# Patient Record
Sex: Male | Born: 1945 | Race: Black or African American | Hispanic: No | Marital: Married | Smoking: Current every day smoker
Health system: Southern US, Community
[De-identification: ages and names within clinical notes are randomized; demographics above are authoritative.]

## PROBLEM LIST (undated history)

## (undated) DIAGNOSIS — I10 Essential (primary) hypertension: Secondary | ICD-10-CM

## (undated) DIAGNOSIS — F341 Dysthymic disorder: Secondary | ICD-10-CM

## (undated) DIAGNOSIS — H903 Sensorineural hearing loss, bilateral: Secondary | ICD-10-CM

## (undated) DIAGNOSIS — R7611 Nonspecific reaction to tuberculin skin test without active tuberculosis: Secondary | ICD-10-CM

## (undated) DIAGNOSIS — K2970 Gastritis, unspecified, without bleeding: Secondary | ICD-10-CM

## (undated) DIAGNOSIS — G4730 Sleep apnea, unspecified: Secondary | ICD-10-CM

## (undated) DIAGNOSIS — E663 Overweight: Secondary | ICD-10-CM

## (undated) DIAGNOSIS — E7800 Pure hypercholesterolemia, unspecified: Secondary | ICD-10-CM

## (undated) DIAGNOSIS — Z86010 Personal history of colonic polyps: Secondary | ICD-10-CM

## (undated) DIAGNOSIS — K2990 Gastroduodenitis, unspecified, without bleeding: Secondary | ICD-10-CM

## (undated) DIAGNOSIS — J449 Chronic obstructive pulmonary disease, unspecified: Secondary | ICD-10-CM

## (undated) DIAGNOSIS — F17200 Nicotine dependence, unspecified, uncomplicated: Secondary | ICD-10-CM

## (undated) DIAGNOSIS — K810 Acute cholecystitis: Secondary | ICD-10-CM

## (undated) DIAGNOSIS — D563 Thalassemia minor: Secondary | ICD-10-CM

## (undated) DIAGNOSIS — K5790 Diverticulosis of intestine, part unspecified, without perforation or abscess without bleeding: Secondary | ICD-10-CM

## (undated) HISTORY — DX: Pure hypercholesterolemia, unspecified: E78.00

## (undated) HISTORY — DX: Nonspecific reaction to tuberculin skin test without active tuberculosis: R76.11

## (undated) HISTORY — DX: Overweight: E66.3

## (undated) HISTORY — DX: Sleep apnea, unspecified: G47.30

## (undated) HISTORY — DX: Acute cholecystitis: K81.0

## (undated) HISTORY — DX: Thalassemia minor: D56.3

## (undated) HISTORY — DX: Personal history of colonic polyps: Z86.010

## (undated) HISTORY — DX: Chronic obstructive pulmonary disease, unspecified: J44.9

## (undated) HISTORY — DX: Dysthymic disorder: F34.1

## (undated) HISTORY — DX: Nicotine dependence, unspecified, uncomplicated: F17.200

## (undated) HISTORY — DX: Gastritis, unspecified, without bleeding: K29.70

## (undated) HISTORY — DX: Diverticulosis of intestine, part unspecified, without perforation or abscess without bleeding: K57.90

## (undated) HISTORY — PX: CATARACT EXTRACTION, BILATERAL: SHX1313

## (undated) HISTORY — DX: Essential (primary) hypertension: I10

## (undated) HISTORY — DX: Sensorineural hearing loss, bilateral: H90.3

## (undated) HISTORY — DX: Gastroduodenitis, unspecified, without bleeding: K29.90

---

## 1988-12-11 DIAGNOSIS — E785 Hyperlipidemia, unspecified: Secondary | ICD-10-CM | POA: Insufficient documentation

## 1998-03-11 ENCOUNTER — Encounter (INDEPENDENT_AMBULATORY_CARE_PROVIDER_SITE_OTHER): Payer: Self-pay | Admitting: Internal Medicine

## 1998-08-05 ENCOUNTER — Ambulatory Visit (HOSPITAL_COMMUNITY): Admission: RE | Admit: 1998-08-05 | Discharge: 1998-08-05 | Payer: Self-pay | Admitting: Gastroenterology

## 1999-05-12 ENCOUNTER — Encounter (INDEPENDENT_AMBULATORY_CARE_PROVIDER_SITE_OTHER): Payer: Self-pay | Admitting: Internal Medicine

## 2001-08-11 ENCOUNTER — Encounter (INDEPENDENT_AMBULATORY_CARE_PROVIDER_SITE_OTHER): Payer: Self-pay | Admitting: Internal Medicine

## 2001-08-11 LAB — CONVERTED CEMR LAB: PSA: 0.72 ng/mL

## 2002-01-10 ENCOUNTER — Ambulatory Visit (HOSPITAL_COMMUNITY): Admission: RE | Admit: 2002-01-10 | Discharge: 2002-01-10 | Payer: Self-pay | Admitting: Gastroenterology

## 2002-05-30 ENCOUNTER — Other Ambulatory Visit: Admission: RE | Admit: 2002-05-30 | Discharge: 2002-05-30 | Payer: Self-pay | Admitting: Internal Medicine

## 2003-06-11 ENCOUNTER — Encounter (INDEPENDENT_AMBULATORY_CARE_PROVIDER_SITE_OTHER): Payer: Self-pay | Admitting: Internal Medicine

## 2003-06-11 LAB — CONVERTED CEMR LAB: PSA: 0.5 ng/mL

## 2004-07-11 ENCOUNTER — Encounter (INDEPENDENT_AMBULATORY_CARE_PROVIDER_SITE_OTHER): Payer: Self-pay | Admitting: Internal Medicine

## 2004-07-11 LAB — CONVERTED CEMR LAB: PSA: 0.5 ng/mL

## 2005-03-17 ENCOUNTER — Ambulatory Visit: Payer: Self-pay | Admitting: Family Medicine

## 2005-03-23 ENCOUNTER — Ambulatory Visit: Payer: Self-pay | Admitting: Family Medicine

## 2005-08-22 ENCOUNTER — Ambulatory Visit: Payer: Self-pay | Admitting: Family Medicine

## 2006-03-23 ENCOUNTER — Ambulatory Visit: Payer: Self-pay | Admitting: Family Medicine

## 2006-06-08 ENCOUNTER — Ambulatory Visit: Payer: Self-pay | Admitting: Family Medicine

## 2006-07-11 ENCOUNTER — Emergency Department (HOSPITAL_COMMUNITY): Admission: EM | Admit: 2006-07-11 | Discharge: 2006-07-11 | Payer: Self-pay | Admitting: Emergency Medicine

## 2006-07-11 IMAGING — CR DG SHOULDER 2+V*L*
3 series · 3 of 3 positions shown · non-contrast
Comparison: None.

CLINICAL DATA: Chest pain.  
 CHEST - 2 VIEW:

[view not recorded (1 of 3)]
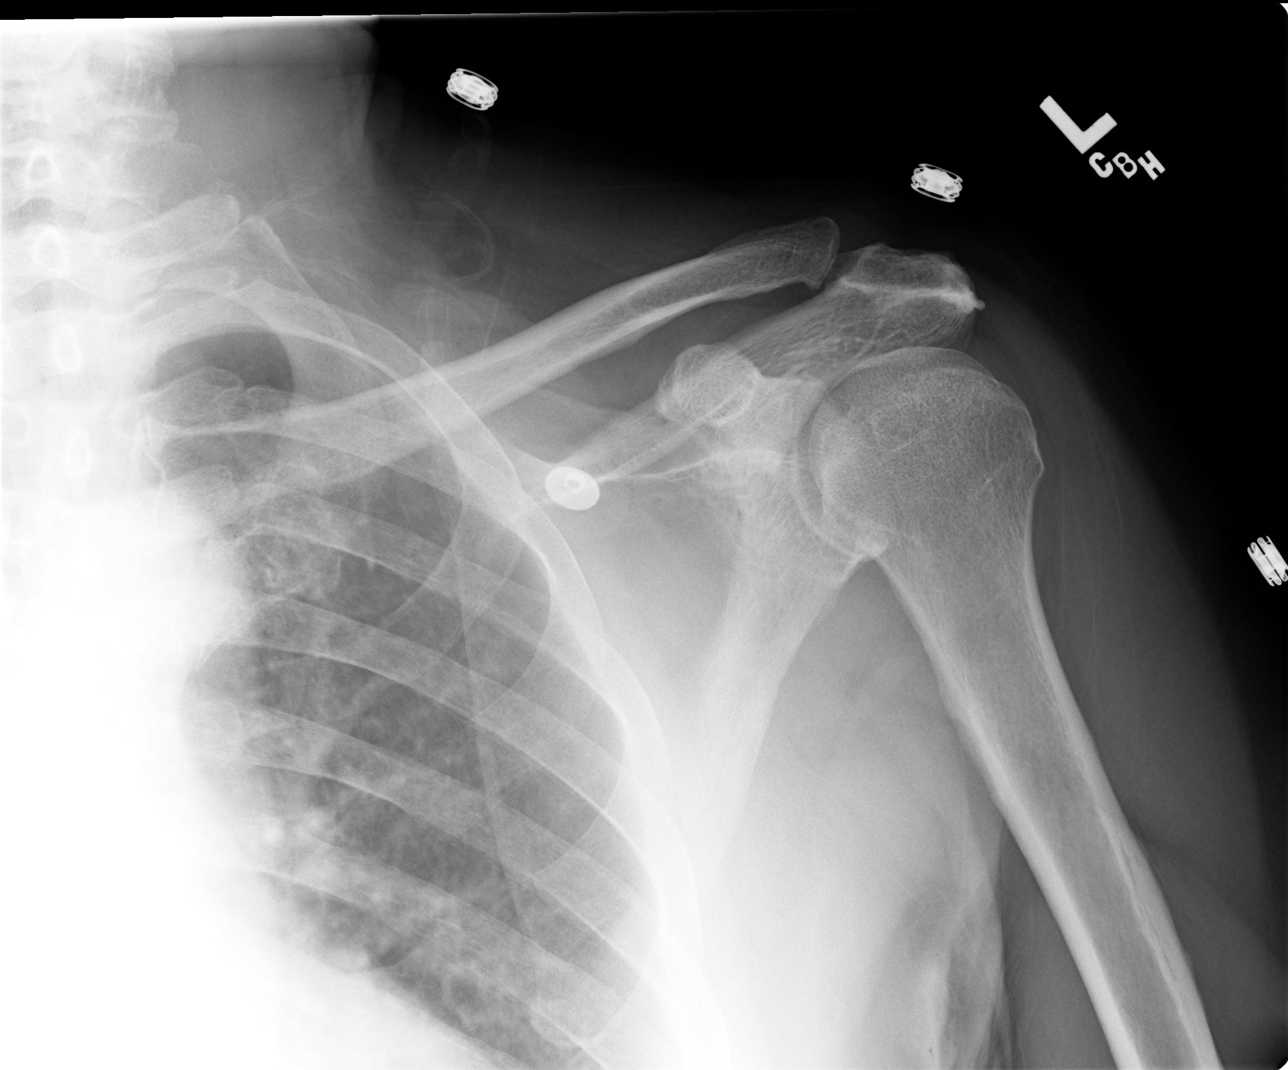

[view not recorded (2 of 3)]
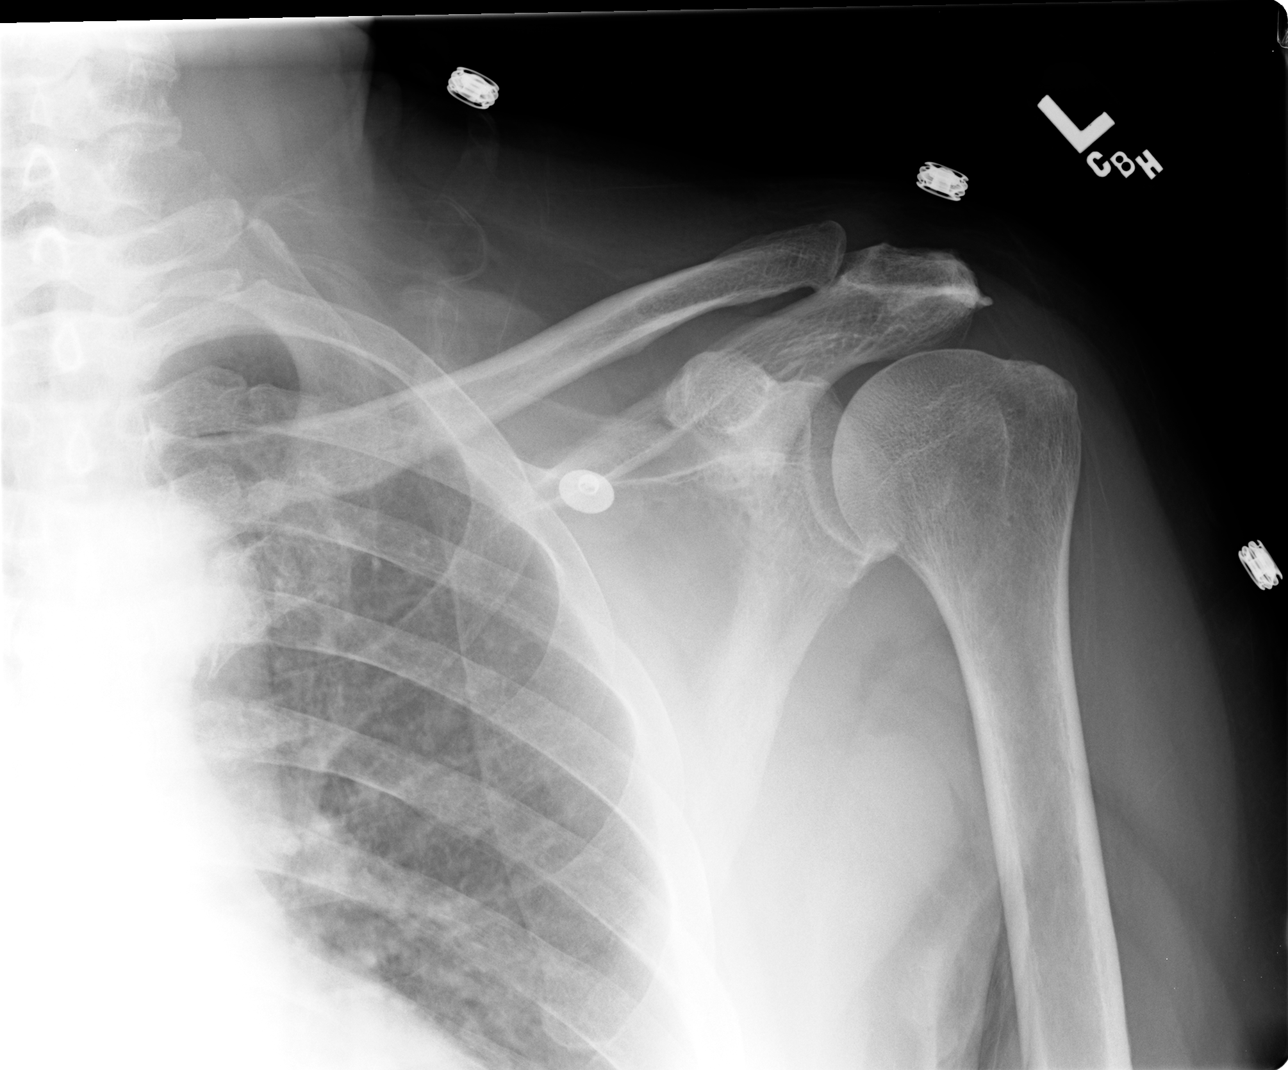

[view not recorded (3 of 3)]
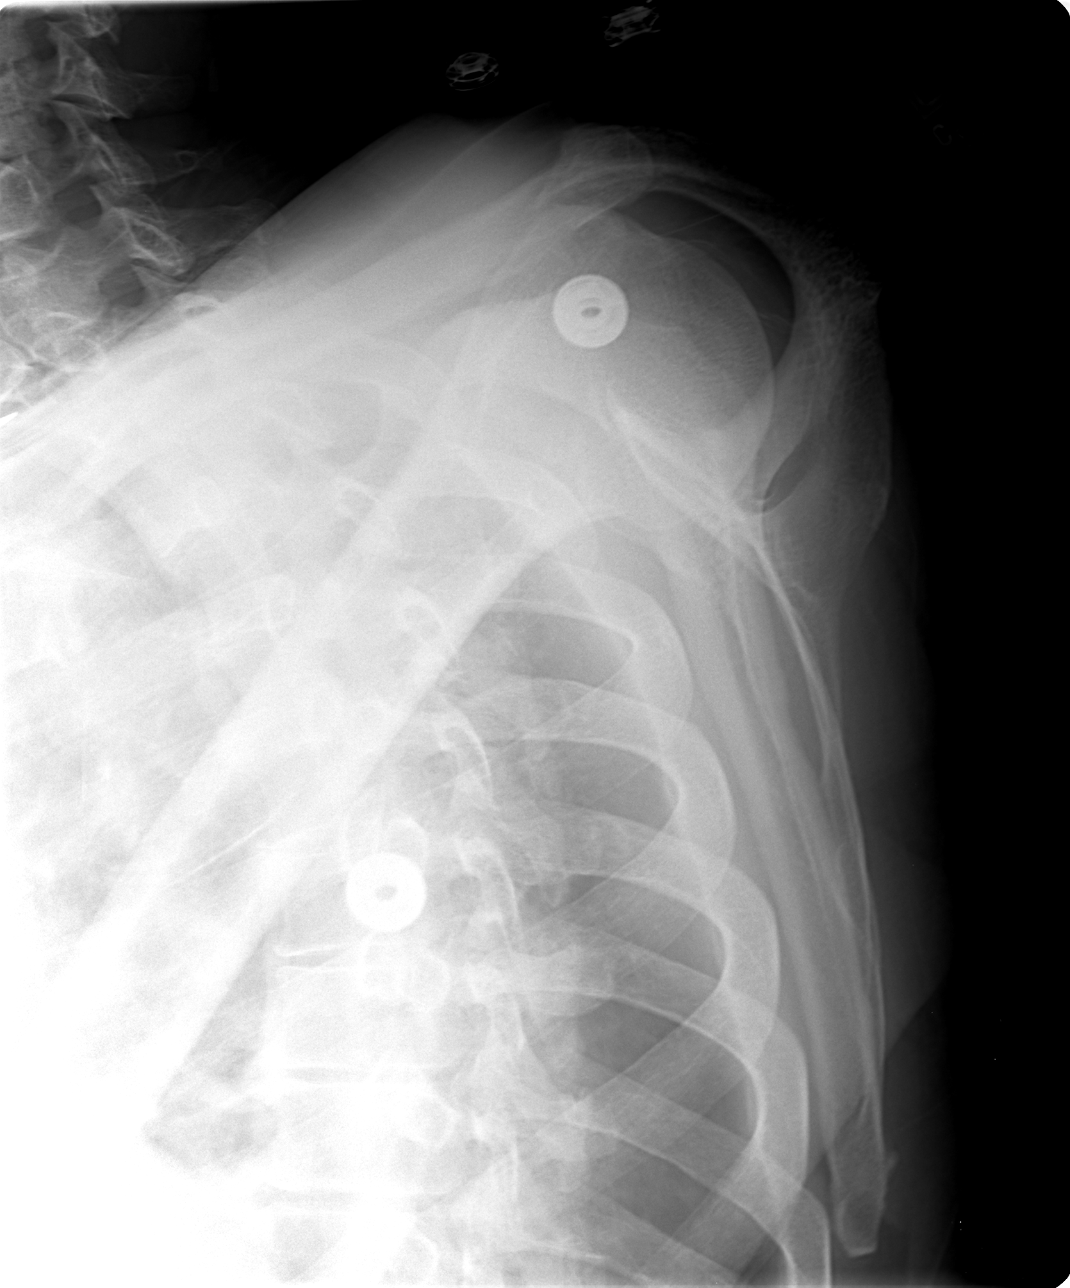

[3 of 3 positions shown; findings below may reference images not displayed]

FINDINGS: There is significant elevation of the right hemidiaphragm probably due to phrenic nerve paralysis.  I do not have prior films to determine if this is acute or chronic.  There is some compressive atelectasis in the right lower lobe.  The left lung is clear.  There is no effusion or heart failure.
IMPRESSION: There is significant elevation of the right hemidiaphragm with some right lower lobe atelectasis otherwise, no acute abnormality.
 LEFT SHOULDER - 3 VIEW:
 Normal alignment and no fracture. There is some down turning of the acromion with a lateral spur which could contribute to impingement. There is mild degenerative change and spurring in the AC joint.
IMPRESSION: Degenerative spurring of the acromion and AC joint.  No acute bony abnormality.

## 2006-07-11 IMAGING — CR DG CHEST 2V
2 series · 2 of 2 positions shown · non-contrast
Comparison: None.

CLINICAL DATA: Chest pain.  
 CHEST - 2 VIEW:

[view not recorded (1 of 2)]
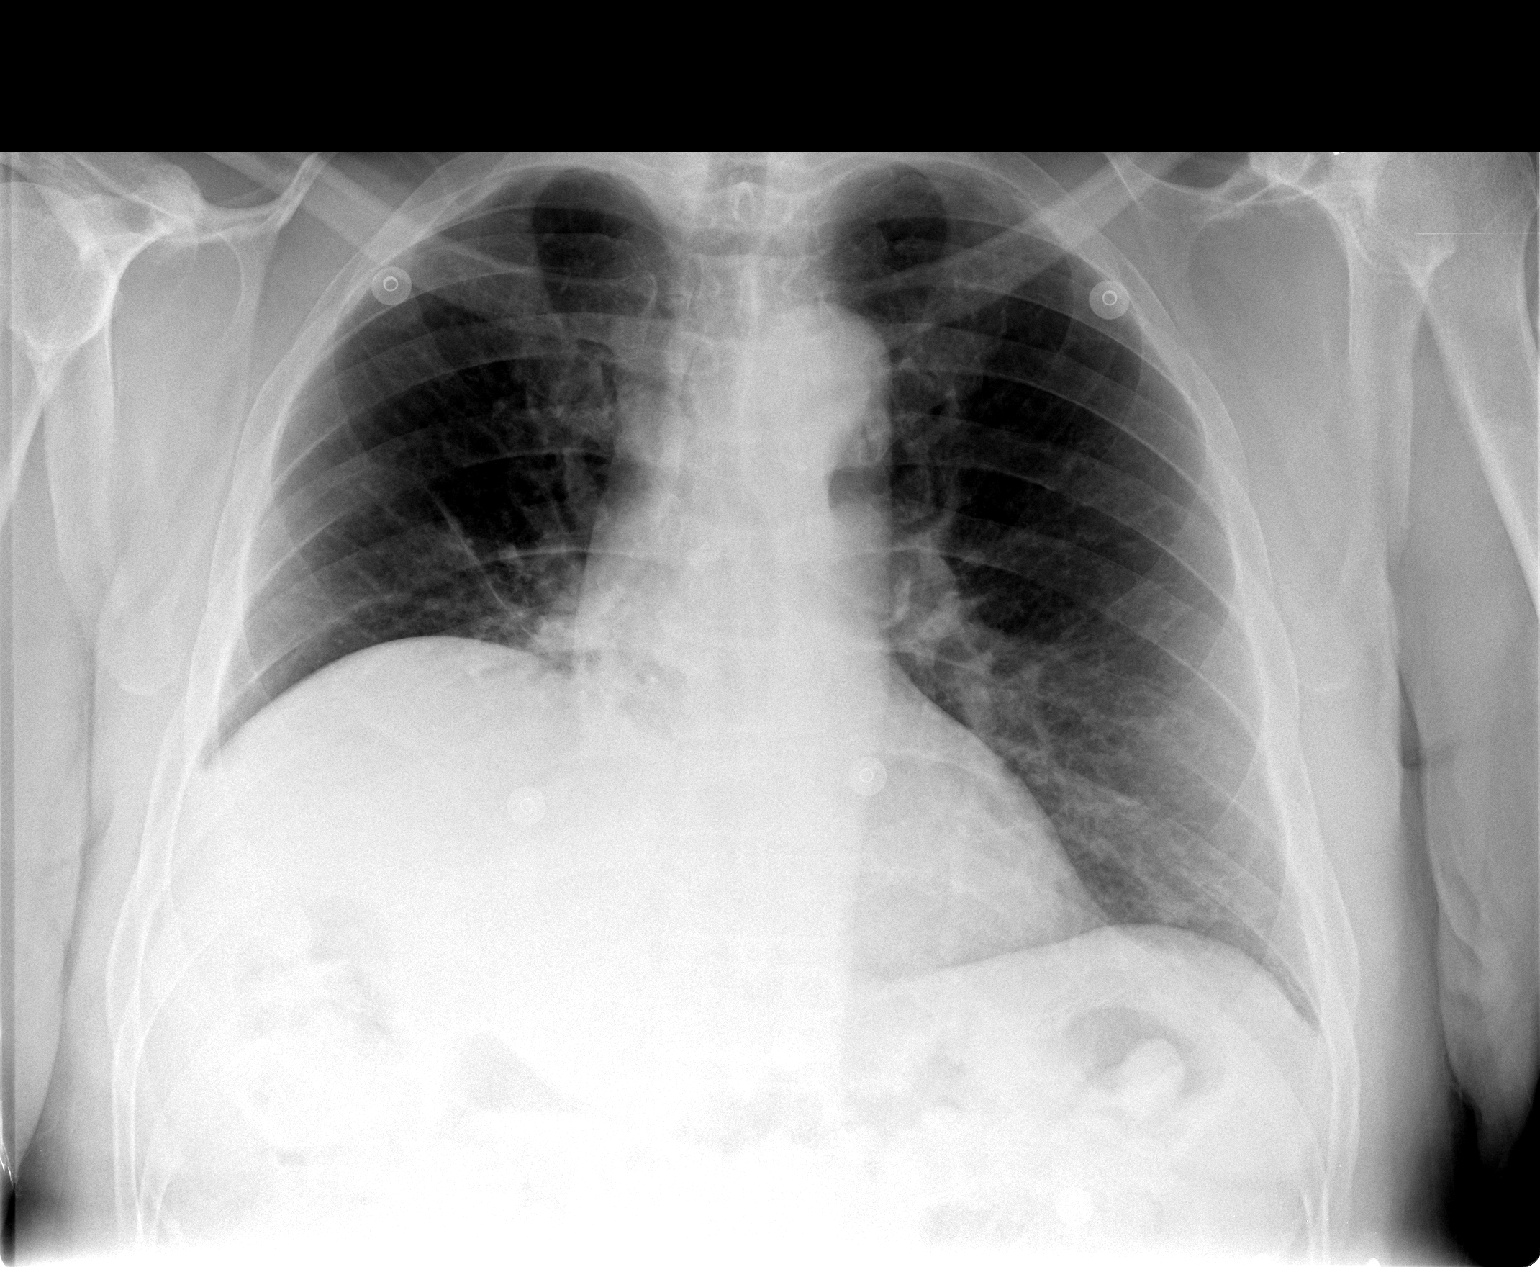

[view not recorded (2 of 2)]
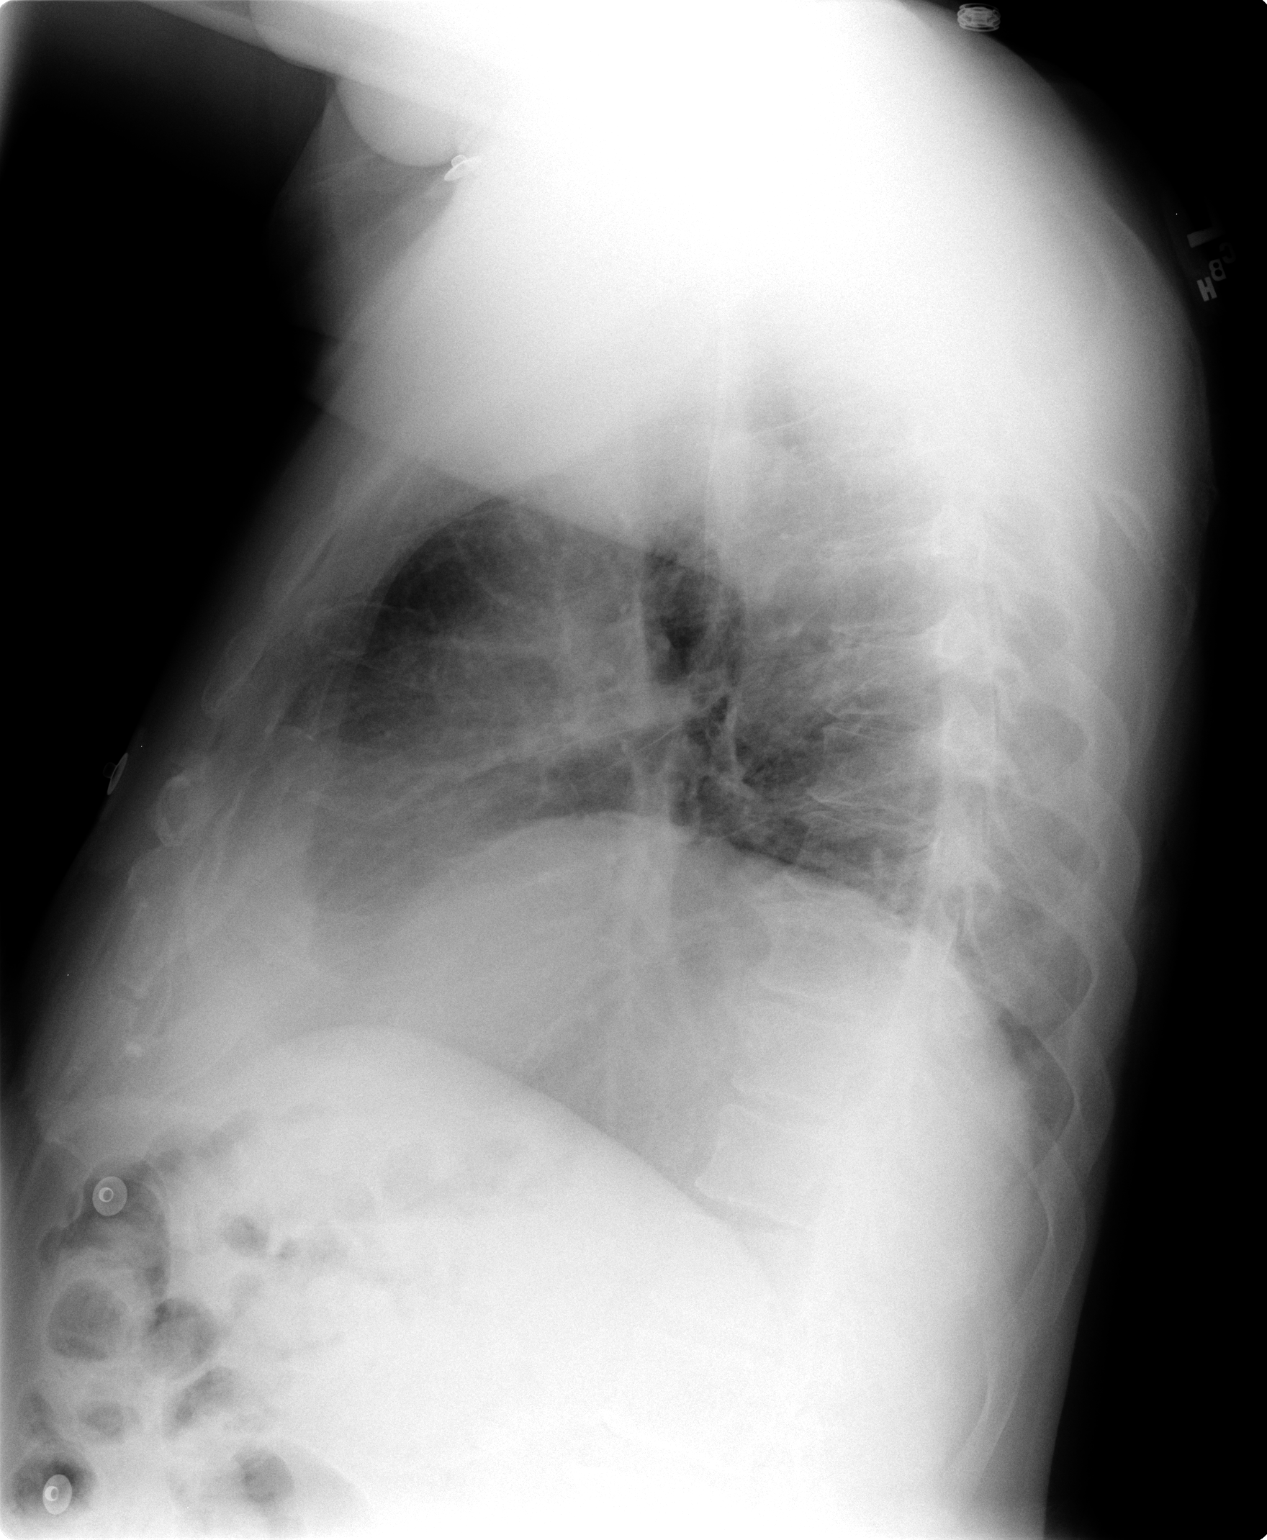

[2 of 2 positions shown; findings below may reference images not displayed]

FINDINGS: There is significant elevation of the right hemidiaphragm probably due to phrenic nerve paralysis.  I do not have prior films to determine if this is acute or chronic.  There is some compressive atelectasis in the right lower lobe.  The left lung is clear.  There is no effusion or heart failure.
IMPRESSION: There is significant elevation of the right hemidiaphragm with some right lower lobe atelectasis otherwise, no acute abnormality.
 LEFT SHOULDER - 3 VIEW:
 Normal alignment and no fracture. There is some down turning of the acromion with a lateral spur which could contribute to impingement. There is mild degenerative change and spurring in the AC joint.
IMPRESSION: Degenerative spurring of the acromion and AC joint.  No acute bony abnormality.

## 2006-10-26 ENCOUNTER — Encounter (INDEPENDENT_AMBULATORY_CARE_PROVIDER_SITE_OTHER): Payer: Self-pay | Admitting: Internal Medicine

## 2006-10-26 ENCOUNTER — Ambulatory Visit: Payer: Self-pay | Admitting: Family Medicine

## 2006-10-26 LAB — CONVERTED CEMR LAB: PSA: 1.07 ng/mL

## 2007-05-03 ENCOUNTER — Ambulatory Visit: Payer: Self-pay | Admitting: Family Medicine

## 2007-05-03 DIAGNOSIS — I10 Essential (primary) hypertension: Secondary | ICD-10-CM

## 2007-05-03 DIAGNOSIS — E7800 Pure hypercholesterolemia, unspecified: Secondary | ICD-10-CM

## 2007-05-03 DIAGNOSIS — F17200 Nicotine dependence, unspecified, uncomplicated: Secondary | ICD-10-CM

## 2007-05-03 DIAGNOSIS — E669 Obesity, unspecified: Secondary | ICD-10-CM

## 2007-05-07 LAB — CONVERTED CEMR LAB
AST: 16 units/L (ref 0–37)
BUN: 6 mg/dL (ref 6–23)
CO2: 31 meq/L (ref 19–32)
Calcium: 9.3 mg/dL (ref 8.4–10.5)
Chloride: 108 meq/L (ref 96–112)
Cholesterol: 151 mg/dL (ref 0–200)
Creatinine, Ser: 1.1 mg/dL (ref 0.4–1.5)
GFR calc Af Amer: 88 mL/min

## 2007-05-31 ENCOUNTER — Telehealth (INDEPENDENT_AMBULATORY_CARE_PROVIDER_SITE_OTHER): Payer: Self-pay | Admitting: *Deleted

## 2007-10-09 ENCOUNTER — Encounter (INDEPENDENT_AMBULATORY_CARE_PROVIDER_SITE_OTHER): Payer: Self-pay | Admitting: Internal Medicine

## 2007-10-09 DIAGNOSIS — K2970 Gastritis, unspecified, without bleeding: Secondary | ICD-10-CM | POA: Insufficient documentation

## 2007-10-09 DIAGNOSIS — Z87448 Personal history of other diseases of urinary system: Secondary | ICD-10-CM

## 2007-10-09 DIAGNOSIS — Z860100 Personal history of colon polyps, unspecified: Secondary | ICD-10-CM | POA: Insufficient documentation

## 2007-10-09 DIAGNOSIS — K625 Hemorrhage of anus and rectum: Secondary | ICD-10-CM

## 2007-10-09 DIAGNOSIS — K2990 Gastroduodenitis, unspecified, without bleeding: Secondary | ICD-10-CM

## 2007-11-22 ENCOUNTER — Ambulatory Visit: Payer: Self-pay | Admitting: Internal Medicine

## 2007-11-22 ENCOUNTER — Ambulatory Visit: Payer: Self-pay | Admitting: Family Medicine

## 2007-11-26 LAB — CONVERTED CEMR LAB
ALT: 17 units/L (ref 0–53)
Albumin: 3.7 g/dL (ref 3.5–5.2)
Bilirubin, Direct: 0.2 mg/dL (ref 0.0–0.3)
HDL: 39.4 mg/dL (ref >39.0)
LDL Cholesterol: 99 mg/dL (ref 0–99)
VLDL: 11 mg/dL (ref 0–40)

## 2007-12-27 ENCOUNTER — Ambulatory Visit: Payer: Self-pay | Admitting: Family Medicine

## 2008-06-26 ENCOUNTER — Ambulatory Visit: Payer: Self-pay | Admitting: Family Medicine

## 2008-06-26 DIAGNOSIS — G4733 Obstructive sleep apnea (adult) (pediatric): Secondary | ICD-10-CM

## 2008-06-30 LAB — CONVERTED CEMR LAB
ALT: 14 units/L (ref 0–53)
BUN: 9 mg/dL (ref 6–23)
Calcium: 9.4 mg/dL (ref 8.4–10.5)
GFR calc Af Amer: 79 mL/min
Glucose, Bld: 99 mg/dL (ref 70–99)
HDL: 38.7 mg/dL — ABNORMAL LOW (ref >39.0)
LDL Cholesterol: 90 mg/dL (ref 0–99)
Sodium: 138 meq/L (ref 135–145)
VLDL: 11 mg/dL (ref 0–40)

## 2008-07-10 ENCOUNTER — Ambulatory Visit: Payer: Self-pay | Admitting: Pulmonary Disease

## 2008-08-25 ENCOUNTER — Encounter: Payer: Self-pay | Admitting: Pulmonary Disease

## 2008-08-25 ENCOUNTER — Ambulatory Visit (HOSPITAL_BASED_OUTPATIENT_CLINIC_OR_DEPARTMENT_OTHER): Admission: RE | Admit: 2008-08-25 | Discharge: 2008-08-25 | Payer: Self-pay | Admitting: Pulmonary Disease

## 2008-09-09 ENCOUNTER — Ambulatory Visit: Payer: Self-pay | Admitting: Pulmonary Disease

## 2008-09-18 ENCOUNTER — Ambulatory Visit: Payer: Self-pay | Admitting: Pulmonary Disease

## 2008-09-25 ENCOUNTER — Telehealth: Payer: Self-pay | Admitting: Internal Medicine

## 2008-09-29 ENCOUNTER — Telehealth (INDEPENDENT_AMBULATORY_CARE_PROVIDER_SITE_OTHER): Payer: Self-pay | Admitting: *Deleted

## 2008-10-15 ENCOUNTER — Encounter: Payer: Self-pay | Admitting: Pulmonary Disease

## 2008-10-23 ENCOUNTER — Ambulatory Visit: Payer: Self-pay | Admitting: Family Medicine

## 2008-10-23 DIAGNOSIS — F341 Dysthymic disorder: Secondary | ICD-10-CM

## 2008-10-23 HISTORY — DX: Dysthymic disorder: F34.1

## 2008-11-27 ENCOUNTER — Ambulatory Visit: Payer: Self-pay | Admitting: Family Medicine

## 2009-03-25 ENCOUNTER — Ambulatory Visit: Payer: Self-pay | Admitting: Family Medicine

## 2009-03-25 DIAGNOSIS — F528 Other sexual dysfunction not due to a substance or known physiological condition: Secondary | ICD-10-CM

## 2009-03-25 LAB — CONVERTED CEMR LAB
ALT: 14 units/L (ref 0–53)
AST: 13 units/L (ref 0–37)
Calcium: 9.2 mg/dL (ref 8.4–10.5)
Creatinine, Ser: 1.1 mg/dL (ref 0.4–1.5)
LDL Cholesterol: 101 mg/dL — ABNORMAL HIGH (ref 0–99)
PSA: 0.9 ng/mL (ref 0.10–4.00)
TSH: 0.43 microintl units/mL (ref 0.35–5.50)
VLDL: 11.6 mg/dL (ref 0.0–40.0)

## 2009-04-09 ENCOUNTER — Encounter (INDEPENDENT_AMBULATORY_CARE_PROVIDER_SITE_OTHER): Payer: Self-pay | Admitting: Internal Medicine

## 2009-06-11 ENCOUNTER — Ambulatory Visit: Payer: Self-pay | Admitting: Family Medicine

## 2009-07-09 ENCOUNTER — Encounter (INDEPENDENT_AMBULATORY_CARE_PROVIDER_SITE_OTHER): Payer: Self-pay | Admitting: Internal Medicine

## 2010-01-06 ENCOUNTER — Ambulatory Visit: Payer: Self-pay | Admitting: Family Medicine

## 2010-01-07 ENCOUNTER — Telehealth: Payer: Self-pay | Admitting: Family Medicine

## 2010-01-07 LAB — CONVERTED CEMR LAB
ALT: 24 units/L (ref 0–53)
AST: 18 units/L (ref 0–37)
Albumin: 4.1 g/dL (ref 3.5–5.2)
Alkaline Phosphatase: 95 units/L (ref 39–117)
BUN: 12 mg/dL (ref 6–23)
Bilirubin, Direct: 0.1 mg/dL (ref 0.0–0.3)
CO2: 28 meq/L (ref 19–32)
Calcium: 9.7 mg/dL (ref 8.4–10.5)
Chloride: 108 meq/L (ref 96–112)
Cholesterol: 157 mg/dL (ref 0–200)
Creatinine, Ser: 1.2 mg/dL (ref 0.4–1.5)
GFR calc non Af Amer: 78.86 mL/min (ref >60)
Glucose, Bld: 89 mg/dL (ref 70–99)
HDL: 46.8 mg/dL (ref >39.00)
LDL Cholesterol: 95 mg/dL (ref 0–99)
PSA: 0.93 ng/mL (ref 0.10–4.00)
Potassium: 4.2 meq/L (ref 3.5–5.1)
Sodium: 142 meq/L (ref 135–145)
Total Bilirubin: 1 mg/dL (ref 0.3–1.2)
Total CHOL/HDL Ratio: 3
Total Protein: 6.4 g/dL (ref 6.0–8.3)
Triglycerides: 77 mg/dL (ref 0.0–149.0)
VLDL: 15.4 mg/dL (ref 0.0–40.0)

## 2010-05-05 ENCOUNTER — Telehealth (INDEPENDENT_AMBULATORY_CARE_PROVIDER_SITE_OTHER): Payer: Self-pay | Admitting: *Deleted

## 2010-08-23 ENCOUNTER — Telehealth: Payer: Self-pay | Admitting: Family Medicine

## 2010-12-07 ENCOUNTER — Ambulatory Visit: Payer: Self-pay | Admitting: Family Medicine

## 2010-12-07 LAB — CONVERTED CEMR LAB
Albumin: 3.9 g/dL (ref 3.5–5.2)
Alkaline Phosphatase: 99 units/L (ref 39–117)
Calcium: 9.5 mg/dL (ref 8.4–10.5)
Creatinine, Ser: 1.1 mg/dL (ref 0.4–1.5)
GFR calc non Af Amer: 88.79 mL/min (ref >60.00)
Glucose, Bld: 87 mg/dL (ref 70–99)
HDL: 42.7 mg/dL (ref >39.00)
PSA: 0.88 ng/mL (ref 0.10–4.00)
Sodium: 142 meq/L (ref 135–145)
TSH: 0.55 microintl units/mL (ref 0.35–5.50)
VLDL: 10.4 mg/dL (ref 0.0–40.0)

## 2011-01-10 NOTE — Progress Notes (Signed)
Summary: has not made appt for colonoscopy  Phone Note Other Incoming   Caller: Patient Call For: Ruthe Mannan MD Caller: Nurse from Dr. Donavan Burnet office Summary of Call: Dr. Donavan Burnet office is calling to let you know that Xaden came to his first initial visit w/ Dr. Matthias Hughs, but still has not scheduled appt. for his colonoscopy. They just wanted to inform you. There number is [phone] Initial call taken by: Melody Comas,  August 23, 2010 1:54 PM  Follow-up for Phone Call        thank you. Ruthe Mannan MD  August 24, 2010 8:53 AM

## 2011-01-10 NOTE — Assessment & Plan Note (Signed)
Summary: 30 MIN ROA 6 MTHS CYD   Vital Signs:  Patient profile:   65 year old male Height:      69 inches Weight:      241 pounds BMI:     35.72 Temp:     98 degrees F oral Pulse rate:   84 / minute Pulse rhythm:   regular BP sitting:   128 / 70  (left arm) Cuff size:   large  Vitals Entered By: Delilah Shan CMA [facility] Dull) (January 06, 2010 9:06 AM) CC: 30 minute OV - 6 months follow up, Lipid Management, Hypertension Management   History of Present Illness: 65 yo male here to establish care with me, was a patient of Billie Bean.  Doing well.  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  Trying to get used to be at home, big adjustment but he is happy to be retired.  Wants to get out of house more but too cold right now.  HLD- On Crestor 5 mg daily.  In 03/2009, lipids were near goal with LDL 101, HDL 40 and TG 58.   Goal LDL for him less than 100 given risk factors of HTN, smoking, family history, and age.   Well man- has h/o colon polyps, last colonscopy I can find in old notes was 2003.  Supposed to repeat every 5 years.  He cannot remember name of GI doc.  He will call me when he gets home.  Has not had any changes in his bowels, blood in stool, or abdominal pain.  30 minutes spent reviewing old chart, current office notes and discussing with patient.   Hypertension History:      He denies headache, chest pain, palpitations, dyspnea with exertion, orthopnea, PND, peripheral edema, visual symptoms, neurologic problems, syncope, and side effects from treatment.  He notes no problems with any antihypertensive medication side effects.        Positive major cardiovascular risk factors include male age 35 years old or older, hyperlipidemia, hypertension, family history for ischemic heart disease (females less than 77 years old), and current tobacco user.    Lipid Management History:      Positive NCEP/ATP III risk factors include male age 58 years old or older, family history for  ischemic heart disease (females less than 67 years old), current tobacco user, and hypertension.      Current Medications (verified): 1)  Crestor 5 Mg Tabs (Rosuvastatin Calcium) .... Take 1 Tablet By Mouth Once A Day 2)  Metamucil 30.9 % Powd (Psyllium) .... As Needed 3)  Daily Multiple Vitamins  Tabs (Multiple Vitamin) .... Take 1 Tablet By Mouth Once A Day 4)  Adult Aspirin Ec Low Strength 81 Mg  Tbec (Aspirin) .... Take One Tablet Daily. 5)  Norvasc 10 Mg Tabs (Amlodipine Besylate) .Marland Kitchen.. 1 Once Daily For Bp By Mouth 6)  Levitra 20 Mg Tabs (Vardenafil Hcl) .... As Needed  Allergies: 1)  ! Sulfa 2)  ! Zoloft 3)  Aspirin  Past History:  Past Medical History: Last updated: 09/18/2008  Current Problems:  WELL ADULT (ICD-V70.0) SLEEP APNEA (ICD-780.57), PSG 08/25/08 RDI 16 Hx of RECTAL BLEEDING (ICD-569.3) Hx of GASTRITIS (ICD-535.50) PROSTATITIS, HX OF (ICD-V13.09) COLONIC POLYPS, HX OF (ICD-V12.72) TOBACCO ABUSE (ICD-305.1) OVERWEIGHT (ICD-278.02) HYPERCHOLESTEROLEMIA, PURE (ICD-272.0) HYPERTENSION, BENIGN ESSENTIAL (ICD-401.1) PNEUMONIA  Past Surgical History: Last updated: 10/09/2007 Colonoscopy- polyps (07/1998),////  neg (12/2001)  Family History: Last updated: 12-16-2007 Father: died n his 60's--cirrhoisis of liver,  Mother: HTN Siblings: 2 sister --1  died at age 65 of MI, DM,HBP                               1--mini stroke at 81, HBP  DM-M cousin MI-MGM CVA- mgm--died of Prostate Cancer-0 Breast Cancer-0 Ovarian Cancer-0 Uterine Cancer-0 Colon Cancer-0 Drug/ ETOH Abuse-MUNCLE Depression-   Social History: Last updated: 05/03/2007 Marital Status: Married Children: 2, 3 grand children Occupation: drives truck --long distance  Risk Factors: Alcohol Use: 0 (03/25/2009) Caffeine Use: 0 (03/25/2009) Exercise: no (03/25/2009)  Risk Factors: Smoking Status: current (03/25/2009) Packs/Day: 1.0 (03/25/2009) Cigars/wk: 0 (03/25/2009) Pipe Use/wk: 0  (03/25/2009) Cans of tobacco/wk: 0 (03/25/2009) Passive Smoke Exposure: yes (11/22/2007)  Review of Systems      See HPI General:  Denies malaise and weakness. Eyes:  Denies blurring. ENT:  Denies difficulty swallowing. CV:  Denies chest pain or discomfort, difficulty breathing at night, difficulty breathing while lying down, fainting, leg cramps with exertion, lightheadness, near fainting, palpitations, shortness of breath with exertion, swelling of feet, and swelling of hands. Resp:  Denies shortness of breath. GI:  Denies abdominal pain, bloody stools, and change in bowel habits. GU:  Denies dysuria and erectile dysfunction. Psych:  Denies anxiety and depression.  Physical Exam  General:  alert, well-developed, well-nourished, well-hydrated, and overweight-appearing.   Lungs:  normal respiratory effort, no intercostal retractions, no accessory muscle use, and normal breath sounds.   Heart:  normal rate, regular rhythm, and no murmur.   Extremities:  no edema eithr lower legs Psych:  normally interactive and good eye contact.     Impression & Recommendations:  Problem # 1:  HYPERCHOLESTEROLEMIA, PURE (ICD-272.0) Assessment Unchanged Near goal.  Recheck FLP, LFTs today. His updated medication list for this problem includes:    Crestor 5 Mg Tabs (Rosuvastatin calcium) .Marland Kitchen... Take 1 tablet by mouth once a day  Orders: Venipuncture (19147) TLB-Lipid Panel (80061-LIPID) TLB-Hepatic/Liver Function Pnl (80076-HEPATIC)  Problem # 2:  HYPERTENSION, BENIGN ESSENTIAL (ICD-401.1) Assessment: Unchanged Stable.   Continue Norvasc, not having any issues with side effects. His updated medication list for this problem includes:    Norvasc 10 Mg Tabs (Amlodipine besylate) .Marland Kitchen... 1 once daily for bp by mouth  Orders: Venipuncture (82956) TLB-BMP (Basic Metabolic Panel-BMET) (80048-METABOL)  Problem # 3:  COLONIC POLYPS, HX OF (ICD-V12.72) Assessment: Unchanged Needs a colonoscopy.  He  wants to see old GI doctor but cannot remember name.  He will call us when he gets home.  Problem # 4:  SPECIAL SCREENING MALIGNANT NEOPLASM OF PROSTATE (ICD-V76.44) PSA today. Orders: Venipuncture (21308) TLB-PSA (Prostate Specific Antigen) (84153-PSA)  Complete Medication List: 1)  Crestor 5 Mg Tabs (Rosuvastatin calcium) .... Take 1 tablet by mouth once a day 2)  Metamucil 30.9 % Powd (Psyllium) .... As needed 3)  Daily Multiple Vitamins Tabs (Multiple vitamin) .... Take 1 tablet by mouth once a day 4)  Adult Aspirin Ec Low Strength 81 Mg Tbec (Aspirin) .... Take one tablet daily. 5)  Norvasc 10 Mg Tabs (Amlodipine besylate) .Marland Kitchen.. 1 once daily for bp by mouth 6)  Levitra 20 Mg Tabs (Vardenafil hcl) .... As needed  Hypertension Assessment/Plan:      The patient's hypertensive risk group is category B: At least one risk factor (excluding diabetes) with no target organ damage.  His calculated 10 year risk of coronary heart disease is 22 %.  Today's blood pressure is 128/70.  His blood pressure goal is < 140/90.  Lipid Assessment/Plan:      Based on NCEP/ATP III, the patient's risk factor category is "2 or more risk factors and a calculated 10 year CAD risk of > 20%".  The patient's lipid goals are as follows: Total cholesterol goal is 200; LDL cholesterol goal is 100; HDL cholesterol goal is 40; Triglyceride goal is 150.  His LDL cholesterol goal has not been met.  He has been counseled on adjunctive measures for lowering his cholesterol and has been provided with dietary instructions.    Patient Instructions: 1)  It was really nice to meet you, Mr. Loewe. 2)  Give me a call when you get home and find your GI doctor's name and when your last colonoscopy was. 3)  We will be in touch with your labs over the next couple of days, either by phonetree or we will call you. Prescriptions: NORVASC 10 MG TABS (AMLODIPINE BESYLATE) 1 once daily for BP by mouth  #30 Tablet x 6   Entered and  Authorized by:   Ruthe Mannan MD   Signed by:   Ruthe Mannan MD on 01/06/2010   Method used:   [facility] to        [facility] (retail)       [address]       [place], [place]  [address]       Ph: [phone] or [phone]       Fax: [phone]   RxID:   [id number] CRESTOR 5 MG TABS (ROSUVASTATIN CALCIUM) Take 1 tablet by mouth once a day  #30 Tablet x 6   Entered and Authorized by:   Ruthe Mannan MD   Signed by:   Ruthe Mannan MD on 01/06/2010   Method used:   [facility]        [facility] (retail)       [address]       [place], [place]  [address]       Ph: [phone] or [phone]       Fax: [phone]   RxID:   [id number]   Current Allergies (reviewed today): ! SULFA ! ZOLOFT ASPIRIN

## 2011-01-10 NOTE — Progress Notes (Signed)
  Phone Note Other Incoming   Request: Send information Summary of Call: Request for records received from [facility]. Request forwarded to [facility].     

## 2011-01-10 NOTE — Progress Notes (Signed)
Summary: Update on pt's GI dr.  Phone Note Call from Patient Call back at [phone]   Caller: Patient Call For: Dr. Dayton Martes Summary of Call: Pt saw Dr Dayton Martes on 01/06/10. Dr. Tasia Catchings is pt's GI doctor and pt was not sure when last colonoscopy. I called Dr. Sherin Quarry' office and pt had last colonoscopy in Jan 2003 which was normal. Pt was to repeat colonscopy in 5 years and did not.Dr Sherin Quarry has retired. thank you. Initial call taken by: Lewanda Rife LPN,  January 07, 2010 1:27 PM  Follow-up for Phone Call        Appt made with Dr Matthias Hughs on 03/01/2010 at 2:00pm. Follow-up by: Carlton Adam,  January 11, 2010 8:50 AM

## 2011-01-12 NOTE — Assessment & Plan Note (Signed)
Summary: CPX/TRANSFER FROM [facility]   Vital Signs:  Patient profile:   65 year old male Height:      69 inches Weight:      246 pounds BMI:     36.46 Temp:     97.8 degrees F oral Pulse rate:   80 / minute Pulse rhythm:   regular BP sitting:   138 / 80  (left arm) Cuff size:   large  Vitals Entered By: Selena Batten Dance CMA Duncan Dull) (December 07, 2010 8:23 AM) CC: CPx/Transfer from [facility]   History of Present Illness: CC: CPE  1. HLD - takes crestor 5mg  tolerates well.  No myalgias.  Too expensive, requests change.    2. HTN - on norvasc 10mg  daily, tolerates well.  no HA, vision changes, chest pain, tightness, urinary changes, LE swelling.    3. smoking - 1/2 ppd.  needs to stop.    preventative -  flu shot - doesn't take pneumovax - takes, not due yet prostate - always normal, last checked last year, no recent DRE colonoscopy - due, last 2003, h/o polyps.  Dr. Matthias Hughs.    Lipid Management History:      Positive NCEP/ATP III risk factors include male age 76 years old or older, family history for ischemic heart disease (females less than 3 years old), current tobacco user, and hypertension.     Current Medications (verified): 1)  Crestor 5 Mg Tabs (Rosuvastatin Calcium) .... Take 1 Tablet By Mouth Once A Day 2)  Metamucil 30.9 % Powd (Psyllium) .... As Needed 3)  Daily Multiple Vitamins  Tabs (Multiple Vitamin) .... Take 1 Tablet By Mouth Once A Day 4)  Adult Aspirin Ec Low Strength 81 Mg  Tbec (Aspirin) .... Take One Tablet Daily. 5)  Norvasc 10 Mg Tabs (Amlodipine Besylate) .Marland Kitchen.. 1 Once Daily For Bp By Mouth  Allergies: 1)  ! Sulfa 2)  ! Zoloft 3)  Aspirin  Past History:  Past Surgical History: Last updated: 10/09/2007 Colonoscopy- polyps (07/1998),////  neg (12/2001)  Past Medical History: HYPERCHOLESTEROLEMIA, PURE (ICD-272.0) HYPERTENSION, BENIGN ESSENTIAL (ICD-401.1) SLEEP APNEA (ICD-780.57), PSG 08/25/08 RDI 16 Hx of RECTAL BLEEDING (ICD-569.3) Hx of  GASTRITIS (ICD-535.50) PROSTATITIS, HX OF (ICD-V13.09) COLONIC POLYPS, HX OF (ICD-V12.72) TOBACCO ABUSE (ICD-305.1) OVERWEIGHT (ICD-278.02) PNEUMONIA  Family History: Father: died n his 60's--cirrhoisis of liver,  Mother: HTN Siblings: 2 sister --1 died at age 47 of MI, DM,HBP                               1--mini stroke at 49, HBP  DM-M cousin MI-MGM CVA- mgm--died of Prostate Cancer-0 Colon Cancer-0 Drug/ ETOH Abuse-MUNCLE  Social History: Smoking 1/2 ppd ( ~40PY), no EtOH, no rec drugs caffeine: occasional tea Marital Status: Married Children: 2, 3 grand children Occupation: drives truck - retired 2010  Review of Systems  The patient denies anorexia, fever, weight loss, weight gain, vision loss, decreased hearing, hoarseness, chest pain, syncope, dyspnea on exertion, peripheral edema, prolonged cough, headaches, hemoptysis, abdominal pain, melena, hematochezia, severe indigestion/heartburn, hematuria, incontinence, difficulty walking, and depression.    Physical Exam  General:  alert, well-developed, well-nourished, well-hydrated, and overweight-appearing.   Head:  Normocephalic and atraumatic without obvious abnormalities. No apparent alopecia or balding. Eyes:  No corneal or conjunctival inflammation noted. EOMI. Perrla.  Ears:  Tms clear bilaterally Nose:  nares clear bilaterally Mouth:  MMM, no pharyngeal erythema/ edema Neck:  No deformities, masses, or tenderness noted.  no bruits appreciated Lungs:  Normal respiratory effort, chest expands symmetrically. Lungs are clear to auscultation, no crackles or wheezes. Heart:  Normal rate and regular rhythm. S1 and S2 normal without gallop, murmur, click, rub or other extra sounds. Abdomen:  Bowel sounds positive,abdomen soft and non-tender without masses, organomegaly or hernias noted. Rectal:  No external abnormalities noted. Normal sphincter tone. No rectal masses or tenderness. guaiac negative Prostate:  Prostate  gland firm and smooth, no enlargement, nodularity, tenderness, mass, asymmetry or induration. Msk:  No deformity or scoliosis noted of thoracic or lumbar spine.   Pulses:  2+ rad pulses Extremities:  no pedal edema Neurologic:  CN grossly intact, station and gait intact Skin:  Intact without suspicious lesions or rashes Psych:  full affect, pleasant and cooperative   Impression & Recommendations:  Problem # 1:  WELL ADULT (ICD-V70.0) Reviewed preventive care protocols, scheduled due services, and updated immunizations.  declines flu.  rec schedule colonsocopy, due.  last 2003 and normal but rec rpt 5 yeras as h/o polyps.  Problem # 2:  SPECIAL SCREENING MALIGNANT NEOPLASM OF PROSTATE (ICD-V76.44) DRE reassuring today, check PSA.  Orders: TLB-PSA (Prostate Specific Antigen) (84153-PSA)  Problem # 3:  HYPERCHOLESTEROLEMIA, PURE (ICD-272.0) discussed different options including lipitor vs pravastatin.  Pt hesitant about side effects of lipitor.  would like to try pravastatin.  start at 40mg  which should be equivalent to 5mg  crestor.  His updated medication list for this problem includes:    Pravastatin Sodium 40 Mg Tabs (Pravastatin sodium) .Marland Kitchen... Take one daily for cholesterol  Orders: TLB-Lipid Panel (80061-LIPID) TLB-BMP (Basic Metabolic Panel-BMET) (80048-METABOL) TLB-Hepatic/Liver Function Pnl (80076-HEPATIC)  Problem # 4:  HYPERTENSION, BENIGN ESSENTIAL (ICD-401.1) good control with current regimen.  His updated medication list for this problem includes:    Norvasc 10 Mg Tabs (Amlodipine besylate) .Marland Kitchen... 1 once daily for bp by mouth  BP today: 138/80 Prior BP: 128/70 (01/06/2010)  10 Yr Risk Heart Disease: 11 % Prior 10 Yr Risk Heart Disease: 22 % (01/06/2010)  Labs Reviewed: K+: 4.2 (01/06/2010) Creat: : 1.2 (01/06/2010)   Chol: 157 (01/06/2010)   HDL: 46.80 (01/06/2010)   LDL: 95 (01/06/2010)   TG: 77.0 (01/06/2010)  Problem # 5:  SPECIAL SCREENING FOR MALIGNANT  NEOPLASMS COLON (ICD-V76.51) rec set up for colonoscopy.  Orders: Hemoccult Guaiac-1 spec.(in office) (82270)  Problem # 6:  TOBACCO ABUSE (ICD-305.1) encouraged cessation.  Complete Medication List: 1)  Pravastatin Sodium 40 Mg Tabs (Pravastatin sodium) .... Take one daily for cholesterol 2)  Metamucil 30.9 % Powd (Psyllium) .... As needed 3)  Daily Multiple Vitamins Tabs (Multiple vitamin) .... Take 1 tablet by mouth once a day 4)  Adult Aspirin Ec Low Strength 81 Mg Tbec (Aspirin) .... Take one tablet daily. 5)  Norvasc 10 Mg Tabs (Amlodipine besylate) .Marland Kitchen.. 1 once daily for bp by mouth  Lipid Assessment/Plan:      Based on NCEP/ATP III, the patient's risk factor category is "0-1 risk factors".  The patient's lipid goals are as follows: Total cholesterol goal is 200; LDL cholesterol goal is 100; HDL cholesterol goal is 40; Triglyceride goal is 150.  His LDL cholesterol goal has not been met.  He has been counseled on adjunctive measures for lowering his cholesterol and has been provided with dietary instructions.    Patient Instructions: 1)  Return in 1 year for next CPE or as needed. 2)  Call Dr. Matthias Hughs to schedule colonoscopy. 3)  Prostate exam today. 4)  Change Crestor to  Pravastatin daily.  Return in 4-5 months for cholesterol check lab visit (FLP, LFTs 272.4).  schedule appointment up front. 5)  Try to quit smoking on your own.  If you want any help, please return to see [place] for suggesions.  [email or web address] Prescriptions: PRAVASTATIN SODIUM 40 MG TABS (PRAVASTATIN SODIUM) take one daily for cholesterol  #30 x 6   Entered and Authorized by:   Eustaquio Boyden  MD   Signed by:   Eustaquio Boyden  MD on 12/07/2010   Method used:   [facility]        [facility] [id number]       [address]       [place], [place]  [address]       Ph: [phone] or [phone]       Fax: [phone]   RxID:   [id number]    Orders Added: 1)  New Patient 40-64  years [99386] 2)  Hemoccult Guaiac-1 spec.(in office) [82270] 3)  TLB-Lipid Panel [80061-LIPID] 4)  TLB-BMP (Basic Metabolic Panel-BMET) [80048-METABOL] 5)  TLB-Hepatic/Liver Function Pnl [80076-HEPATIC] 6)  TLB-PSA (Prostate Specific Antigen) [66063-KZS]    Current Allergies (reviewed today): ! SULFA ! ZOLOFT ASPIRIN   Prevention & Chronic Care Immunizations   Influenza vaccine: Fluvax 3+  (12/06/1999)   Influenza vaccine deferral: Refused  (12/07/2010)   Influenza vaccine due: 12/05/2000    Tetanus booster: 06/26/2008: Td   Tetanus booster due: 06/26/2018    Pneumococcal vaccine: Pneumovax  (06/26/2008)   Pneumococcal vaccine due: 06/26/2013    H. zoster vaccine: Not documented  Colorectal Screening   Hemoccult: Not documented    Colonoscopy: Not documented  Other Screening   PSA: 0.93  (01/06/2010)   PSA ordered.   PSA due due: 03/25/2010   Smoking status: current  (03/25/2009)  Lipids   Total Cholesterol: 157  (01/06/2010)   LDL: 95  (01/06/2010)   LDL Direct: Not documented   HDL: 46.80  (01/06/2010)   Triglycerides: 77.0  (01/06/2010)    SGOT (AST): 18  (01/06/2010)   SGPT (ALT): 24  (01/06/2010)   Alkaline phosphatase: 95  (01/06/2010)   Total bilirubin: 1.0  (01/06/2010)    Lipid flowsheet reviewed?: Yes   Progress toward LDL goal: At goal  Hypertension   Last Blood Pressure: 138 / 80  (12/07/2010)   Serum creatinine: 1.2  (01/06/2010)   Serum potassium 4.2  (01/06/2010)    Hypertension flowsheet reviewed?: Yes   Progress toward BP goal: At goal  Self-Management Support :    Hypertension self-management support: Not documented    Lipid self-management support: Not documented

## 2011-03-22 ENCOUNTER — Ambulatory Visit: Payer: Self-pay | Admitting: Internal Medicine

## 2011-03-23 ENCOUNTER — Other Ambulatory Visit: Payer: Self-pay | Admitting: Family Medicine

## 2011-03-24 ENCOUNTER — Other Ambulatory Visit: Payer: Self-pay | Admitting: *Deleted

## 2011-03-24 MED ORDER — AMLODIPINE BESYLATE 10 MG PO TABS: ORAL_TABLET | ORAL | Status: DC

## 2011-04-25 NOTE — Procedures (Signed)
NAME:  DESTINY, TRICKEY NO.:  [id number]   MEDICAL RECORD NO.:  [id number]          PATIENT TYPE:  OUT   LOCATION:  SLEEP CENTER                 FACILITY:  [facility]   PHYSICIAN:  Coralyn Helling, MD        DATE OF BIRTH:  1946/01/28   DATE OF STUDY:  08/25/2008                            NOCTURNAL POLYSOMNOGRAM   REFERRING PHYSICIAN:   REFERRING PHYSICIAN:  Coralyn Helling, M.D.   INDICATION FOR STUDY:  Mr. Broyhill is a 65 year old male who has a  history of hypertension and sleep disruption of excessive daytime  sleepiness.  He is referred to the sleep lab for evaluation of  hypersomnia with obstructive sleep apnea.   Height is 5 feet 10 inches.  Weight is 230 pounds.  BMI is 33.  Neck  size is 16 inches.  Blood pressure is 132/78.   EPWORTH SLEEPINESS SCORE:  11.   MEDICATIONS:  1. Crestor.  2. Tiazac.   SLEEP ARCHITECTURE:  Total recording time was 353 minutes.  Total sleep  time was 386 minutes.  Sleep efficiency is 85%.  Sleep latency is 7.5  minutes, which is reduced.  REM latency is 262 minutes, which is  prolonged.  The study was notable for a lack of slow wave sleep and a  reduction in the percentage of REM sleep.  The patient slept both in the  supine and non-supine positions.   RESPIRATORY DATA:  The average respiratory rate was 18.  The overall  apnea-hypopnea index was 4.  The overall Respiratory Disturbance Index  was 16.  The events were exclusively obstructive in nature.  The REM  apnea-hypopnea index was 45.  The non-REM apnea-hypopnea index was 2.4.  The supine apnea-hypopnea index was 7.8.  The non-supine apnea-hypopnea  index was 1.  Moderate snoring was noted by the technician.   OXYGEN DATA:  The baseline oxygenation was 99%.  The oxygen saturation  nadir was 83%.  The patient's spent a total of 3.4 minutes with an  oxygen saturation less than 90% and 2 minutes with an oxygen saturation  less than 88%.   CARDIAC DATA:  The average heart  rate was 69, and the rhythm strip  showed normal sinus rhythm with PVCs.   MOVEMENT-PARASOMNIA:  The patient had one restroom trip.  The periodic  limb movement index was 0.   IMPRESSIONS-RECOMMENDATIONS:  1. This study shows evidence for mild sleep disordered breathing with      a respiratory index of 16 and oxygen saturation nadir of 83%.  He      did have a significant positional as well as REM effect to his      sleep disordered breathing.  2. The patient should be counseled regarding the points of diet,      exercise, and weight reduction.  3. The patient could try positional therapy in addition to weight      reduction.  If these interventions are unsuccessful, then further      consideration should be given to either CPAP therapy, an oral      appliance, or surgical intervention.      Coralyn Helling, MD  Diplomat, American  Board of Sleep Medicine  Electronically Signed     VS/MEDQ  D:  09/09/2008 17:22:52  T:  09/09/2008 19:04:22  Job:  [id number]

## 2011-05-03 ENCOUNTER — Other Ambulatory Visit: Payer: Self-pay | Admitting: Family Medicine

## 2011-05-03 DIAGNOSIS — E785 Hyperlipidemia, unspecified: Secondary | ICD-10-CM

## 2011-05-09 ENCOUNTER — Other Ambulatory Visit (INDEPENDENT_AMBULATORY_CARE_PROVIDER_SITE_OTHER): Payer: Self-pay | Admitting: Family Medicine

## 2011-05-09 DIAGNOSIS — E785 Hyperlipidemia, unspecified: Secondary | ICD-10-CM

## 2011-05-09 LAB — HEPATIC FUNCTION PANEL
ALT: 18 U/L (ref 0–53)
AST: 18 U/L (ref 0–37)
Albumin: 3.9 g/dL (ref 3.5–5.2)
Alkaline Phosphatase: 79 U/L (ref 39–117)
Total Protein: 6 g/dL (ref 6.0–8.3)

## 2011-05-09 LAB — LIPID PANEL
Cholesterol: 167 mg/dL (ref 0–200)
LDL Cholesterol: 106 mg/dL — ABNORMAL HIGH (ref 0–99)
Triglycerides: 86 mg/dL (ref 0.0–149.0)

## 2011-05-24 ENCOUNTER — Other Ambulatory Visit: Payer: Self-pay | Admitting: Family Medicine

## 2011-07-26 ENCOUNTER — Other Ambulatory Visit: Payer: Self-pay | Admitting: Family Medicine

## 2011-11-21 ENCOUNTER — Other Ambulatory Visit: Payer: Self-pay | Admitting: Family Medicine

## 2011-12-18 ENCOUNTER — Other Ambulatory Visit: Payer: Self-pay | Admitting: Family Medicine

## 2011-12-22 ENCOUNTER — Encounter: Payer: Self-pay | Admitting: Family Medicine

## 2011-12-22 ENCOUNTER — Ambulatory Visit (INDEPENDENT_AMBULATORY_CARE_PROVIDER_SITE_OTHER): Payer: Self-pay | Admitting: Family Medicine

## 2011-12-22 ENCOUNTER — Other Ambulatory Visit: Payer: Self-pay | Admitting: Family Medicine

## 2011-12-22 DIAGNOSIS — I10 Essential (primary) hypertension: Secondary | ICD-10-CM

## 2011-12-22 DIAGNOSIS — Z125 Encounter for screening for malignant neoplasm of prostate: Secondary | ICD-10-CM

## 2011-12-22 DIAGNOSIS — E7800 Pure hypercholesterolemia, unspecified: Secondary | ICD-10-CM

## 2011-12-22 DIAGNOSIS — E663 Overweight: Secondary | ICD-10-CM

## 2011-12-22 DIAGNOSIS — Z86010 Personal history of colonic polyps: Secondary | ICD-10-CM

## 2011-12-22 DIAGNOSIS — F17200 Nicotine dependence, unspecified, uncomplicated: Secondary | ICD-10-CM

## 2011-12-22 DIAGNOSIS — F341 Dysthymic disorder: Secondary | ICD-10-CM

## 2011-12-22 LAB — COMPREHENSIVE METABOLIC PANEL
ALT: 20 U/L (ref 0–53)
AST: 14 U/L (ref 0–37)
CO2: 29 mEq/L (ref 19–32)
Calcium: 9.5 mg/dL (ref 8.4–10.5)
Chloride: 104 mEq/L (ref 96–112)
GFR: 79.12 mL/min (ref >60.00)
Potassium: 4.3 mEq/L (ref 3.5–5.1)
Sodium: 140 mEq/L (ref 135–145)
Total Protein: 6.1 g/dL (ref 6.0–8.3)

## 2011-12-22 LAB — PSA: PSA: 0.88 ng/mL (ref 0.10–4.00)

## 2011-12-22 LAB — LIPID PANEL: Total CHOL/HDL Ratio: 4

## 2011-12-22 MED ORDER — AMLODIPINE BESYLATE 10 MG PO TABS: 10.0000 mg | ORAL_TABLET | Freq: Every day | ORAL | Status: DC

## 2011-12-22 MED ORDER — PRAVASTATIN SODIUM 40 MG PO TABS: 40.0000 mg | ORAL_TABLET | Freq: Every day | ORAL | Status: DC

## 2011-12-22 NOTE — Progress Notes (Addendum)
  Subjective:    Patient ID: Dominic Turner, male    DOB: [birth year], 66 y.o.   MRN: [id number]  HPI CC: med refill  Wife with dementia - having trouble calming down, stays agitated.  Sole caregiver.  Has to do everything for her.  Has 2 sons but live out of state.  Does not take time for himself.  Endorses some depression sxs, not overwhelming.  Able to calm down.  Thinks would be able to walk with wife.  HTN - No HA, vision changes, CP/tightness, SOB, leg swelling.  Compliant and tolerant on amlodipine  HLD - no myalgias. Lab Results  Component Value Date   CHOL 167 05/09/2011   HDL 43.60 05/09/2011   LDLCALC 106* 05/09/2011   TRIG 86.0 05/09/2011   CHOLHDL 4 05/09/2011   Smoking - 1/2 ppd.  (prior 1 ppd).  Working on cutting back.  Wt Readings from Last 3 Encounters:  12/22/11 247 lb 8 oz (112.265 kg)  12/07/10 246 lb (111.585 kg)  01/06/10 241 lb (109.317 kg)   preventative -  flu shot - doesn't take  pneumovax - takes, not due yet  prostate - always normal, last checked last year, no recent DRE  colonoscopy - due, last 2003, h/o polyps. Dr. Matthias Hughs.  Wants to get on medicare 09/2012, then will get colonoscopy.  Declines iFOB.  Review of Systems Per HPI    Objective:   Physical Exam  Nursing note and vitals reviewed. Constitutional: He appears well-developed and well-nourished. No distress.  HENT:  Head: Normocephalic and atraumatic.  Mouth/Throat: Oropharynx is clear and moist. No oropharyngeal exudate.  Eyes: Conjunctivae and EOM are normal. Pupils are equal, round, and reactive to light. No scleral icterus.  Neck: Normal range of motion. Neck supple.  Cardiovascular: Normal rate, regular rhythm, normal heart sounds and intact distal pulses.   No murmur heard. Pulmonary/Chest: Effort normal and breath sounds normal. No respiratory distress. He has no wheezes. He has no rales.  Genitourinary: Rectum normal and prostate normal. Rectal exam shows no external hemorrhoid,  no internal hemorrhoid, no fissure, no mass, no tenderness and anal tone normal. Guaiac negative stool.  Musculoskeletal: He exhibits no edema.  Lymphadenopathy:    He has no cervical adenopathy.  Skin: Skin is warm and dry. No rash noted.       Assessment & Plan:

## 2011-12-22 NOTE — Assessment & Plan Note (Signed)
Chronic, stable. Good control on current regimen, continue

## 2011-12-22 NOTE — Patient Instructions (Signed)
Good to see you today, I've refilled your medicines Return after October for follow up or as needed. Call us with questions otherwise.

## 2011-12-22 NOTE — Assessment & Plan Note (Addendum)
Planning on scheduling at end of year. Hemoccult neg today. PSA, DRE reassuring today.

## 2011-12-22 NOTE — Assessment & Plan Note (Signed)
Good control. Refilled meds

## 2011-12-22 NOTE — Assessment & Plan Note (Signed)
Discussed caregiver burnout, importance of taking time for himself. Discussed possibility of xanax or other similar med, pt will try to do self treatment with increased activity (could take wife with him to track and walk, etc). If wants to try med, will let me know.

## 2011-12-22 NOTE — Assessment & Plan Note (Signed)
Continue to encourage cessation. Pt knows to come in if desires further assistance with quitting.

## 2012-03-18 ENCOUNTER — Ambulatory Visit (INDEPENDENT_AMBULATORY_CARE_PROVIDER_SITE_OTHER): Payer: Self-pay | Admitting: Family Medicine

## 2012-03-18 ENCOUNTER — Encounter: Payer: Self-pay | Admitting: Family Medicine

## 2012-03-18 VITALS — BP 140/78 | HR 96 | Temp 98.9°F | Wt 247.8 lb

## 2012-03-18 DIAGNOSIS — J069 Acute upper respiratory infection, unspecified: Secondary | ICD-10-CM | POA: Insufficient documentation

## 2012-03-18 MED ORDER — GUAIFENESIN-CODEINE 100-10 MG/5ML PO SYRP: 5.0000 mL | ORAL_SOLUTION | Freq: Every evening | ORAL | Status: AC | PRN

## 2012-03-18 MED ORDER — FLUTICASONE PROPIONATE 50 MCG/ACT NA SUSP: 2.0000 | Freq: Every day | NASAL | Status: DC

## 2012-03-18 NOTE — Patient Instructions (Signed)
Sounds like you have a viral upper respiratory infection, possibly early viral sinusitis. Antibiotics are not needed for this.  Viral infections usually take 7-10 days to resolve.  The cough can last a few weeks to go away. Use medication as prescribed: cheratussin for cough at night. Push fluids and plenty of rest. Continue simple mucinex with plenty of water. Please return if you are not improving as expected, or if you have high fevers (>101.5) or difficulty swallowing or worsening productive cough. Call clinic with questions.  Good to see you today.

## 2012-03-18 NOTE — Assessment & Plan Note (Addendum)
Main concern is night time congestion. Anticipate viral URTI, could be early sinusitis but anticipate viral. Recommend supportive care.  Update [place] if not improving as expected or worsening.

## 2012-03-18 NOTE — Progress Notes (Signed)
  Subjective:    Patient ID: Dominic Turner, male    DOB: Oct 20, 1946, 66 y.o.   MRN: [id number]  HPI CC: congested  6d h/o cold/congestion in chest.  Cough productive of yellow sputum.  Also feels congested in head and headache.  +PNDrainage.  Feels some wheezy.  Trouble sleeping at night from congestion.  Chills noted.  Started with ST but that is better.  Tried OTC meds (corsidin, mucinex, alka seltzer cold, tylenol) but not really helping.  No abd pain, n/v, SOB, CP/tightness.  No ear or tooth pain.  No sick contacts at home.  Smoking 1/2 ppd but decreased since ill.  No h/o asthma.  States h/o chronic bronchitis.  Past Medical History  Diagnosis Date  . Pure hypercholesterolemia   . Essential hypertension, benign   . Unspecified sleep apnea   . Hemorrhage of rectum and anus   . Unspecified gastritis and gastroduodenitis without mention of hemorrhage   . Personal history of other disorder of urinary system   . Personal history of colonic polyps   . Tobacco use disorder   . Overweight     Review of Systems Per HPI    Objective:   Physical Exam  Nursing note and vitals reviewed. Constitutional: He appears well-developed and well-nourished. No distress.  HENT:  Head: Normocephalic and atraumatic.  Right Ear: Hearing, tympanic membrane, external ear and ear canal normal.  Left Ear: Hearing, tympanic membrane, external ear and ear canal normal.  Nose: Nose normal. No mucosal edema or rhinorrhea. Right sinus exhibits no maxillary sinus tenderness and no frontal sinus tenderness. Left sinus exhibits no maxillary sinus tenderness and no frontal sinus tenderness.  Mouth/Throat: Uvula is midline and mucous membranes are normal. Posterior oropharyngeal erythema present. No oropharyngeal exudate, posterior oropharyngeal edema or tonsillar abscesses.  Eyes: Conjunctivae and EOM are normal. Pupils are equal, round, and reactive to light. No scleral icterus.  Neck: Normal range of  motion. Neck supple.  Cardiovascular: Normal rate, regular rhythm, normal heart sounds and intact distal pulses.   No murmur heard. Pulmonary/Chest: Effort normal and breath sounds normal. No respiratory distress. He has no wheezes. He has no rales.       Overall clear.  Some upper airway transmission  Lymphadenopathy:    He has no cervical adenopathy.  Skin: Skin is warm and dry. No rash noted.       Assessment & Plan:

## 2012-04-17 ENCOUNTER — Ambulatory Visit (INDEPENDENT_AMBULATORY_CARE_PROVIDER_SITE_OTHER): Payer: Self-pay | Admitting: Family Medicine

## 2012-04-17 ENCOUNTER — Encounter: Payer: Self-pay | Admitting: Family Medicine

## 2012-04-17 VITALS — BP 138/72 | HR 80 | Temp 97.0°F | Wt 241.8 lb

## 2012-04-17 DIAGNOSIS — L989 Disorder of the skin and subcutaneous tissue, unspecified: Secondary | ICD-10-CM

## 2012-04-17 DIAGNOSIS — B350 Tinea barbae and tinea capitis: Secondary | ICD-10-CM | POA: Insufficient documentation

## 2012-04-17 MED ORDER — GRISEOFULVIN MICROSIZE 500 MG PO TABS: 500.0000 mg | ORAL_TABLET | Freq: Every day | ORAL | Status: DC

## 2012-04-17 NOTE — Progress Notes (Signed)
  Subjective:    Patient ID: Dominic Turner, male    DOB: January 05, 1946, 66 y.o.   MRN: [id number]  HPI CC: scalp rash?  Red, sore top of head on scalp also with bumps on side of head.  Noted bumps that turn into sores and scabs.  Comes and goes.  Been going on for 2-3 wks.  No h/o similar problems on scalp.  Some pruritis.  Wonders if picked something up from Paediatric nurse.  H/o dandruff but not recently an issue.  Shampoos and conditioners hair daily.  No changes to shampoo, detergents, soaps, lotions  No fevers/chills, new rashes, oral lesions, joint pains, abd pain, n/v.  Review of Systems Per HPI    Objective:   Physical Exam  Nursing note and vitals reviewed. Constitutional: He appears well-developed and well-nourished. No distress.  Skin: Skin is warm and dry. Rash noted.       Top of scalp with erythematous scarring with alopecia. Temporal scalp bilaterally with occasional scaly lesions and some small pustules present.       Assessment & Plan:

## 2012-04-17 NOTE — Assessment & Plan Note (Signed)
+   KOH with budding spores. Treat with 4wks griseofulvin, RTC 1 mo for recheck and see if needs to prolong course.

## 2012-04-17 NOTE — Patient Instructions (Signed)
I think you have fungal infection of scalp - treat with griseofulvin for next month once daily. Also buy selsun blue shampoo (selenium sulfide) and use a few times a week. Call us if not improving or any other concerns.  Ringworm of the Scalp Tinea Capitis is also called scalp ringworm. It is a fungal infection of the skin on the scalp seen mainly in children.  CAUSES  Scalp ringworm spreads from:  Other people.   Pets (cats and dogs) and animals.   Bedding, hats, combs or brushes shared with an infected person   Theater seats that an infected person sat in.  SYMPTOMS  Scalp ringworm causes the following symptoms:  Flaky scales that look like dandruff.   Circles of thick, raised red skin.   Hair loss.   Red pimples or pustules.   Swollen glands in the back of the neck.   Itching.  DIAGNOSIS  A skin scraping or infected hairs will be sent to test for fungus. Testing can be done either by looking under the microscope (KOH examination) or by doing a culture (test to try to grow the fungus). A culture can take up to 2 weeks to come back. TREATMENT   Scalp ringworm must be treated with medicine by mouth to kill the fungus for 6 to 8 weeks.   Medicated shampoos (ketoconazole or selenium sulfide shampoo) may be used to decrease the shedding of fungal spores from the scalp.   Steroid medicines are used for severe cases that are very inflamed in conjunction with antifungal medication.   It is important that any family members or pets that have the fungus be treated.  HOME CARE INSTRUCTIONS   Be sure to treat the rash completely - follow your caregiver's instructions. It can take a month or more to treat. If you do not treat it long enough, the rash can come back.   Watch for other cases in your family or pets.   Do not share brushes, combs, barrettes, or hats. Do not share towels.   Combs, brushes, and hats should be cleaned carefully and natural bristle brushes must be thrown  away.   It is not necessary to shave the scalp or wear a hat during treatment.   Children may attend school once they start treatment with the oral medicine.   Be sure to follow up with your caregiver as directed to be sure the infection is gone.  SEEK MEDICAL CARE IF:   Rash is worse.   Rash is spreading.   Rash returns after treatment is completed.   The rash is not better in 2 weeks with treatment. Fungal infections are slow to respond to treatment. Some redness may remain for several weeks after the fungus is gone.  SEEK IMMEDIATE MEDICAL CARE IF:  The area becomes red, warm, tender, and swollen.   Pus is oozing from the rash.   You or your child has an oral temperature above 102 F (38.9 C), not controlled by medicine.  Document Released: 11/24/2000 Document Revised: 11/16/2011 Document Reviewed: 01/06/2009 [facility] Patient Information 2012 [place], [place].

## 2012-05-21 ENCOUNTER — Ambulatory Visit: Payer: Self-pay | Admitting: Family Medicine

## 2012-06-06 ENCOUNTER — Ambulatory Visit: Payer: Self-pay | Admitting: Family Medicine

## 2012-11-15 ENCOUNTER — Encounter: Payer: Self-pay | Admitting: Family Medicine

## 2012-11-15 ENCOUNTER — Ambulatory Visit (INDEPENDENT_AMBULATORY_CARE_PROVIDER_SITE_OTHER): Payer: Medicare Other | Admitting: Family Medicine

## 2012-11-15 VITALS — BP 132/74 | HR 72 | Temp 98.1°F | Wt 245.2 lb

## 2012-11-15 DIAGNOSIS — Z23 Encounter for immunization: Secondary | ICD-10-CM

## 2012-11-15 DIAGNOSIS — Z125 Encounter for screening for malignant neoplasm of prostate: Secondary | ICD-10-CM

## 2012-11-15 DIAGNOSIS — E7800 Pure hypercholesterolemia, unspecified: Secondary | ICD-10-CM

## 2012-11-15 DIAGNOSIS — B350 Tinea barbae and tinea capitis: Secondary | ICD-10-CM

## 2012-11-15 DIAGNOSIS — I10 Essential (primary) hypertension: Secondary | ICD-10-CM

## 2012-11-15 MED ORDER — GRISEOFULVIN MICROSIZE 500 MG PO TABS: 500.0000 mg | ORAL_TABLET | Freq: Every day | ORAL | Status: DC

## 2012-11-15 NOTE — Progress Notes (Addendum)
  Subjective:    Patient ID: Dominic Turner, male    DOB: 1946-11-03, 66 y.o.   MRN: [id number]  HPI CC: check scalp again  Seen here 04/2012 with dx tinea capitis with positive KOH skin scraping, treated with 4wks griseofulvin.  Advised to return in 1 mo for recheck to see if needed prolonged course, never returned.  Did improve after initial treatment.    Shampoos and conditioners hair daily. No changes to shampoo, detergents, soaps, lotions   Using selsun blue.    No fevers/chills, new rashes, oral lesions, joint pains, abd pain, n/v.   Past Medical History  Diagnosis Date  . Pure hypercholesterolemia   . Essential hypertension, benign   . Unspecified sleep apnea   . Hemorrhage of rectum and anus   . Unspecified gastritis and gastroduodenitis without mention of hemorrhage   . Personal history of other disorder of urinary system   . Personal history of colonic polyps   . Tobacco use disorder   . Overweight     Review of Systems Per HPI    Objective:   Physical Exam  Nursing note and vitals reviewed. Constitutional: He appears well-developed and well-nourished. No distress.  Skin: Skin is warm and dry. Rash noted.       Top and posterior scalp with erythematous scarring and scaling, almost mild keloid formation.  minimal pustules.   Minimal scaly lesions temporal regions       Assessment & Plan:

## 2012-11-15 NOTE — Patient Instructions (Addendum)
I do think this is return of tinea capitis  Treat with another month of griseofulvin. Return in 1 month for fasting blood work, afterwards for physical. Good to see you, call us with questions. Flu shot today

## 2012-11-15 NOTE — Assessment & Plan Note (Signed)
Return of tinea capitis by history. Treat with another course of griseofulvin. Return in 26mo to assess response to treatment. Pt agrees with plan.

## 2012-11-18 ENCOUNTER — Ambulatory Visit: Payer: Self-pay | Admitting: Family Medicine

## 2012-12-11 DIAGNOSIS — R7611 Nonspecific reaction to tuberculin skin test without active tuberculosis: Secondary | ICD-10-CM

## 2012-12-11 HISTORY — DX: Nonspecific reaction to tuberculin skin test without active tuberculosis: R76.11

## 2012-12-18 ENCOUNTER — Other Ambulatory Visit: Payer: Self-pay | Admitting: Family Medicine

## 2012-12-27 ENCOUNTER — Ambulatory Visit (INDEPENDENT_AMBULATORY_CARE_PROVIDER_SITE_OTHER): Payer: Medicare Other | Admitting: Family Medicine

## 2012-12-27 ENCOUNTER — Encounter: Payer: Self-pay | Admitting: Family Medicine

## 2012-12-27 VITALS — BP 142/78 | HR 80 | Temp 97.9°F | Ht 69.0 in | Wt 249.8 lb

## 2012-12-27 DIAGNOSIS — E7800 Pure hypercholesterolemia, unspecified: Secondary | ICD-10-CM

## 2012-12-27 DIAGNOSIS — H9190 Unspecified hearing loss, unspecified ear: Secondary | ICD-10-CM

## 2012-12-27 DIAGNOSIS — Z125 Encounter for screening for malignant neoplasm of prostate: Secondary | ICD-10-CM

## 2012-12-27 DIAGNOSIS — Z1211 Encounter for screening for malignant neoplasm of colon: Secondary | ICD-10-CM

## 2012-12-27 DIAGNOSIS — Z139 Encounter for screening, unspecified: Secondary | ICD-10-CM

## 2012-12-27 DIAGNOSIS — B350 Tinea barbae and tinea capitis: Secondary | ICD-10-CM

## 2012-12-27 DIAGNOSIS — I10 Essential (primary) hypertension: Secondary | ICD-10-CM

## 2012-12-27 DIAGNOSIS — F17200 Nicotine dependence, unspecified, uncomplicated: Secondary | ICD-10-CM

## 2012-12-27 DIAGNOSIS — Z86010 Personal history of colonic polyps: Secondary | ICD-10-CM

## 2012-12-27 DIAGNOSIS — Z136 Encounter for screening for cardiovascular disorders: Secondary | ICD-10-CM

## 2012-12-27 DIAGNOSIS — Z0000 Encounter for general adult medical examination without abnormal findings: Secondary | ICD-10-CM | POA: Insufficient documentation

## 2012-12-27 LAB — COMPREHENSIVE METABOLIC PANEL
AST: 17 U/L (ref 0–37)
Albumin: 3.8 g/dL (ref 3.5–5.2)
BUN: 11 mg/dL (ref 6–23)
CO2: 29 mEq/L (ref 19–32)
Calcium: 9.2 mg/dL (ref 8.4–10.5)
Chloride: 104 mEq/L (ref 96–112)
Creatinine, Ser: 1 mg/dL (ref 0.4–1.5)
GFR: 99.85 mL/min (ref >60.00)
Glucose, Bld: 97 mg/dL (ref 70–99)
Potassium: 3.9 mEq/L (ref 3.5–5.1)

## 2012-12-27 LAB — LIPID PANEL
Cholesterol: 177 mg/dL (ref 0–200)
Triglycerides: 88 mg/dL (ref 0.0–149.0)

## 2012-12-27 LAB — TSH: TSH: 0.46 u[IU]/mL (ref 0.35–5.50)

## 2012-12-27 LAB — PSA, MEDICARE: PSA: 0.98 ng/ml (ref 0.10–4.00)

## 2012-12-27 MED ORDER — GRISEOFULVIN MICROSIZE 500 MG PO TABS: 500.0000 mg | ORAL_TABLET | Freq: Every day | ORAL | Status: DC

## 2012-12-27 MED ORDER — FLUTICASONE PROPIONATE 50 MCG/ACT NA SUSP: 2.0000 | Freq: Every day | NASAL | Status: DC

## 2012-12-27 MED ORDER — PRAVASTATIN SODIUM 40 MG PO TABS: 40.0000 mg | ORAL_TABLET | Freq: Every day | ORAL | Status: DC

## 2012-12-27 NOTE — Assessment & Plan Note (Signed)
Discussed importance of smoking cessation. precontemplative however.

## 2012-12-27 NOTE — Assessment & Plan Note (Signed)
Check FLP today. 

## 2012-12-27 NOTE — Assessment & Plan Note (Signed)
I have personally reviewed the Medicare Annual Wellness questionnaire and have noted 1. The patient's medical and social history 2. Their use of alcohol, tobacco or illicit drugs 3. Their current medications and supplements 4. The patient's functional ability including ADL's, fall risks, home safety risks and hearing or visual impairment. 5. Diet and physical activity 6. Evidence for depression or mood disorders The patients weight, height, BMI have been recorded in the chart.  Hearing and vision has been addressed. I have made referrals, counseling and provided education to the patient based review of the above and I have provided the pt with a written personalized care plan for preventive services. See scanned questionairre. Advanced directives discussed: has at home, asked him to bring in copy.  Reviewed preventative protocols and updated unless pt declined. Scheduled screening abdominal aorta ultrasound EKG - NSR rate 70s, normal axis, intervals, no acute ST/T changes. PSA today, DRE reassuring Will call [facility] office to schedule colonoscopy.

## 2012-12-27 NOTE — Patient Instructions (Addendum)
Pass by [facility]'s office for referral for abdominal ultrasound and for colonoscopy with Dr. Ronney Asters and audiology referral for hearing evaluation. Call your insurance about the shingles shot to see if it is covered or how much it would cost and where is cheaper (here or pharmacy).  If you want to receive here, call for nurse visit. EKG today. Blood work today. Do 1 more month of griseofulvin. Meds refilled today

## 2012-12-27 NOTE — Assessment & Plan Note (Signed)
Refer to audiology per pt request.  Decreased hearing at higher frequencies on L, failed completely on R

## 2012-12-27 NOTE — Assessment & Plan Note (Signed)
Recommended schedule f/u colonoscopy.

## 2012-12-27 NOTE — Assessment & Plan Note (Signed)
Chronic, stable. Continue amlodipine at 10mg  daily.

## 2012-12-27 NOTE — Progress Notes (Signed)
Subjective:    Patient ID: Dominic Turner, male    DOB: [birth year], 67 y.o.   MRN: [id number]  HPI CC: welcome to medicare  Wife with dementia - he is sole caretaker.  Wife is doing worse.  Will bring wife for physical.  Tinea capitis - Seen here 04/2012 with dx tinea capitis with positive KOH skin scraping, treated with 4wks griseofulvin.  Did improve after initial treatment.  Improving but still some residual.  Has completed griseofulvin 1 wk ago.  Using selsun blue 2x/wk.  Smoking - 1/2 ppd.  precontemplative 2/2 anxiety and stress of wife's care. Has been told has COPD in past.  Vision screen normal Hearing screen - failed R ear, L ear high frequencies.  Denies trouble with hearing.  Would like hearing evaluation.  Was truck driver.  No falls. No depression, anhedonia.  Preventative: Fasting today flu shot - 2013 pneumovax - due 06/2013 Td 2009 Shingles shot - have not discussed.   prostate - always normal, due today colonoscopy - due, last 2003, h/o polyps. Dr. Matthias Hughs. Will call to schedule appt. Advanced directives: has living will at home for himself and wife.    Medications and allergies reviewed and updated in chart.  Past histories reviewed and updated if relevant as below. Patient Active Problem List  Diagnosis  . HYPERCHOLESTEROLEMIA, PURE  . OVERWEIGHT  . ANXIETY DEPRESSION  . ERECTILE DYSFUNCTION  . TOBACCO ABUSE  . OBSTRUCTIVE SLEEP APNEA  . HYPERTENSION, BENIGN ESSENTIAL  . GASTRITIS  . RECTAL BLEEDING  . COLONIC POLYPS, HX OF  . PROSTATITIS, HX OF  . Tinea capitis   Past Medical History  Diagnosis Date  . Pure hypercholesterolemia   . Essential hypertension, benign   . Unspecified sleep apnea   . Hemorrhage of rectum and anus   . Unspecified gastritis and gastroduodenitis without mention of hemorrhage   . Personal history of other disorder of urinary system   . Personal history of colonic polyps   . Tobacco use disorder   . Overweight     No past surgical history on file. History  Substance Use Topics  . Smoking status: Current Every Day Smoker -- 0.5 packs/day    Types: Cigarettes    Start date: 12/12/1983  . Smokeless tobacco: Never Used  . Alcohol Use: No   Family History  Problem Relation Age of Onset  . Cirrhosis Father     died in 70's  . Hypertension Mother   . Heart attack Sister   . Stroke Sister   . Heart attack Maternal Grandmother   . Stroke Maternal Grandmother   . Colon cancer      neg. hx.   Allergies  Allergen Reactions  . Aspirin Other (See Comments)    REACTION: gi upset; baby ASA ok  . Sertraline Hcl     REACTION: sexual dysfunction, etc  . Sulfonamide Derivatives    Current Outpatient Prescriptions on File Prior to Visit  Medication Sig Dispense Refill  . amLODipine (NORVASC) 10 MG tablet TAKE 1 TABLET (10 MG TOTAL) BY MOUTH DAILY.  30 tablet  11  . aspirin 81 MG tablet Take 81 mg by mouth daily.        . fluticasone (FLONASE) 50 MCG/ACT nasal spray Place 2 sprays into the nose daily.  16 g  1  . Multiple Vitamin Essential TABS Take 1 tablet by mouth daily.        . pravastatin (PRAVACHOL) 40 MG tablet Take 1 tablet (40 mg total)  by mouth daily.  30 tablet  11     Review of Systems  Constitutional: Negative for fever, chills, activity change, appetite change, fatigue and unexpected weight change.  HENT: Negative for hearing loss and neck pain.   Eyes: Negative for visual disturbance.  Respiratory: Negative for cough, chest tightness, shortness of breath and wheezing.   Cardiovascular: Negative for chest pain, palpitations and leg swelling.  Gastrointestinal: Negative for nausea, vomiting, abdominal pain, diarrhea, constipation, blood in stool and abdominal distention.  Genitourinary: Negative for hematuria and difficulty urinating.  Musculoskeletal: Negative for myalgias and arthralgias.  Skin: Negative for rash.  Neurological: Negative for dizziness, seizures, syncope and  headaches.  Hematological: Does not bruise/bleed easily.  Psychiatric/Behavioral: Negative for dysphoric mood. The patient is not nervous/anxious.        Objective:   Physical Exam  Nursing note and vitals reviewed. Constitutional: He is oriented to person, place, and time. He appears well-developed and well-nourished. No distress.  HENT:  Head: Normocephalic and atraumatic.  Right Ear: Hearing, tympanic membrane, external ear and ear canal normal.  Left Ear: Hearing, tympanic membrane, external ear and ear canal normal.  Nose: Nose normal.  Mouth/Throat: Oropharynx is clear and moist. No oropharyngeal exudate.  Eyes: Conjunctivae normal and EOM are normal. Pupils are equal, round, and reactive to light. No scleral icterus.  Neck: Normal range of motion. Neck supple. No thyromegaly present.  Cardiovascular: Normal rate, regular rhythm, normal heart sounds and intact distal pulses.   No murmur heard. Pulses:      Radial pulses are 2+ on the right side, and 2+ on the left side.  Pulmonary/Chest: Effort normal and breath sounds normal. No respiratory distress. He has no wheezes. He has no rales.  Abdominal: Soft. Bowel sounds are normal. He exhibits no distension and no mass. There is no tenderness. There is no rebound and no guarding.  Genitourinary: Rectum normal and prostate normal. Rectal exam shows no external hemorrhoid, no internal hemorrhoid, no fissure, no mass, no tenderness and anal tone normal. Prostate is not enlarged (20gm) and not tender.  Musculoskeletal: Normal range of motion. He exhibits no edema.  Lymphadenopathy:    He has no cervical adenopathy.  Neurological: He is alert and oriented to person, place, and time.       CN grossly intact, station and gait intact  Skin: Skin is warm and dry. Rash noted.       Residual tinea capitis on scalp on top of head, significant improvement  Psychiatric: He has a normal mood and affect. His behavior is normal. Judgment and  thought content normal.      Assessment & Plan:

## 2012-12-27 NOTE — Assessment & Plan Note (Signed)
Residual. Will treat with 1 more mo griseofulvin. Check LFTs today.

## 2012-12-28 ENCOUNTER — Encounter: Payer: Self-pay | Admitting: Family Medicine

## 2012-12-30 ENCOUNTER — Encounter: Payer: Self-pay | Admitting: *Deleted

## 2013-01-13 ENCOUNTER — Other Ambulatory Visit: Payer: Self-pay | Admitting: Family Medicine

## 2013-01-19 ENCOUNTER — Encounter: Payer: Self-pay | Admitting: Family Medicine

## 2013-02-06 ENCOUNTER — Encounter: Payer: Self-pay | Admitting: *Deleted

## 2013-02-06 DIAGNOSIS — R0989 Other specified symptoms and signs involving the circulatory and respiratory systems: Secondary | ICD-10-CM

## 2013-02-07 ENCOUNTER — Ambulatory Visit (INDEPENDENT_AMBULATORY_CARE_PROVIDER_SITE_OTHER): Payer: Medicare Other | Admitting: *Deleted

## 2013-02-07 DIAGNOSIS — Z111 Encounter for screening for respiratory tuberculosis: Secondary | ICD-10-CM

## 2013-02-08 DIAGNOSIS — Z860101 Personal history of adenomatous and serrated colon polyps: Secondary | ICD-10-CM

## 2013-02-08 DIAGNOSIS — Z86010 Personal history of colonic polyps: Secondary | ICD-10-CM

## 2013-02-08 DIAGNOSIS — K5790 Diverticulosis of intestine, part unspecified, without perforation or abscess without bleeding: Secondary | ICD-10-CM

## 2013-02-08 HISTORY — DX: Personal history of adenomatous and serrated colon polyps: Z86.0101

## 2013-02-08 HISTORY — DX: Diverticulosis of intestine, part unspecified, without perforation or abscess without bleeding: K57.90

## 2013-02-08 HISTORY — DX: Personal history of colonic polyps: Z86.010

## 2013-02-10 ENCOUNTER — Other Ambulatory Visit (INDEPENDENT_AMBULATORY_CARE_PROVIDER_SITE_OTHER): Payer: Medicare Other

## 2013-02-10 ENCOUNTER — Ambulatory Visit (INDEPENDENT_AMBULATORY_CARE_PROVIDER_SITE_OTHER)
Admission: RE | Admit: 2013-02-10 | Discharge: 2013-02-10 | Disposition: A | Discharge status: Home / Self Care | Payer: Medicare Other | Source: Ambulatory Visit | Attending: Family Medicine | Admitting: Family Medicine

## 2013-02-10 ENCOUNTER — Other Ambulatory Visit: Payer: Self-pay | Admitting: Family Medicine

## 2013-02-10 DIAGNOSIS — R7611 Nonspecific reaction to tuberculin skin test without active tuberculosis: Secondary | ICD-10-CM

## 2013-02-10 LAB — COMPREHENSIVE METABOLIC PANEL
ALT: 27 U/L (ref 0–53)
AST: 17 U/L (ref 0–37)
Alkaline Phosphatase: 83 U/L (ref 39–117)
Creatinine, Ser: 1 mg/dL (ref 0.4–1.5)
GFR: 96.37 mL/min (ref >60.00)
Total Bilirubin: 0.7 mg/dL (ref 0.3–1.2)

## 2013-02-10 LAB — CBC WITH DIFFERENTIAL/PLATELET
Basophils Absolute: 0 10*3/uL (ref 0.0–0.1)
HCT: 47.5 % (ref 39.0–52.0)
Hemoglobin: 15.3 g/dL (ref 13.0–17.0)
Lymphs Abs: 1.6 10*3/uL (ref 0.7–4.0)
MCHC: 32.2 g/dL (ref 30.0–36.0)
Monocytes Relative: 12.6 % — ABNORMAL HIGH (ref 3.0–12.0)
Neutro Abs: 2.1 10*3/uL (ref 1.4–7.7)
RDW: 20.1 % — ABNORMAL HIGH (ref 11.5–14.6)

## 2013-02-10 IMAGING — CR DG CHEST 2V
2 series · 2 of 2 positions shown · non-contrast
Comparison: None.

CLINICAL DATA: Positive PPD

CHEST - 2 VIEW

[view not recorded (1 of 2)]
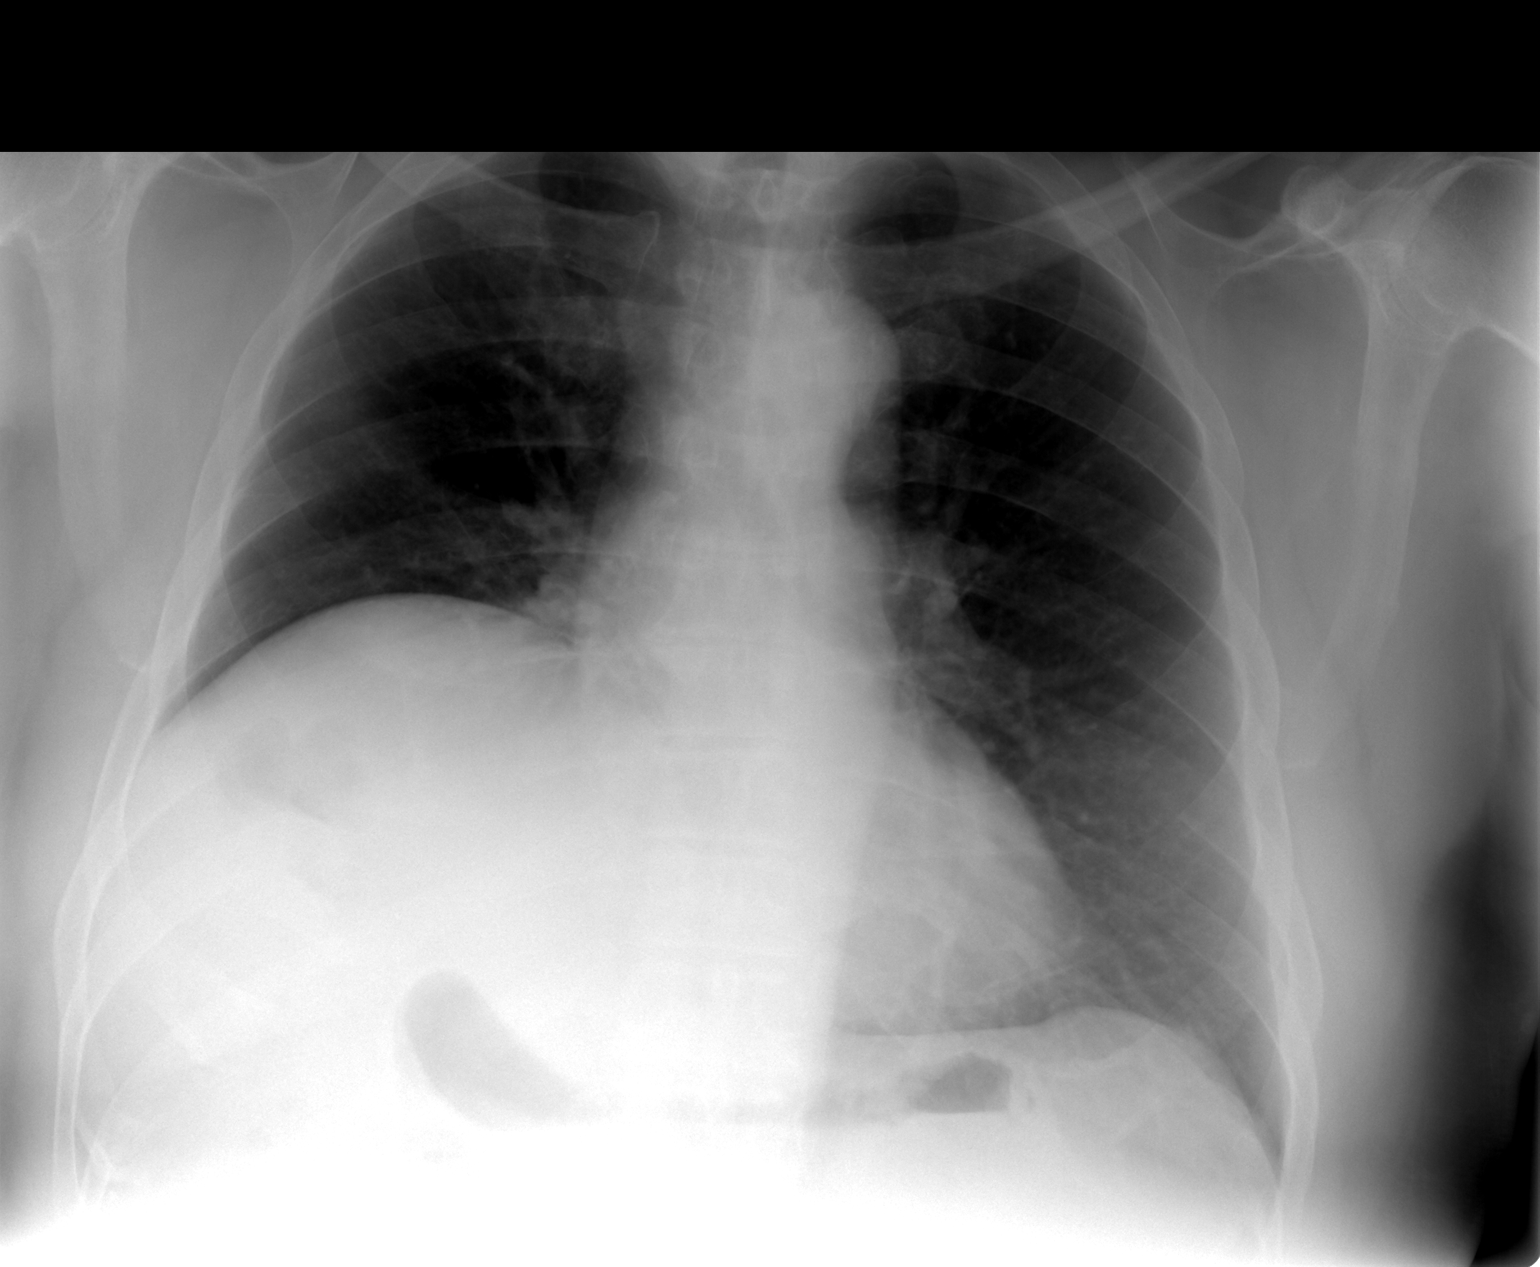

[view not recorded (2 of 2)]
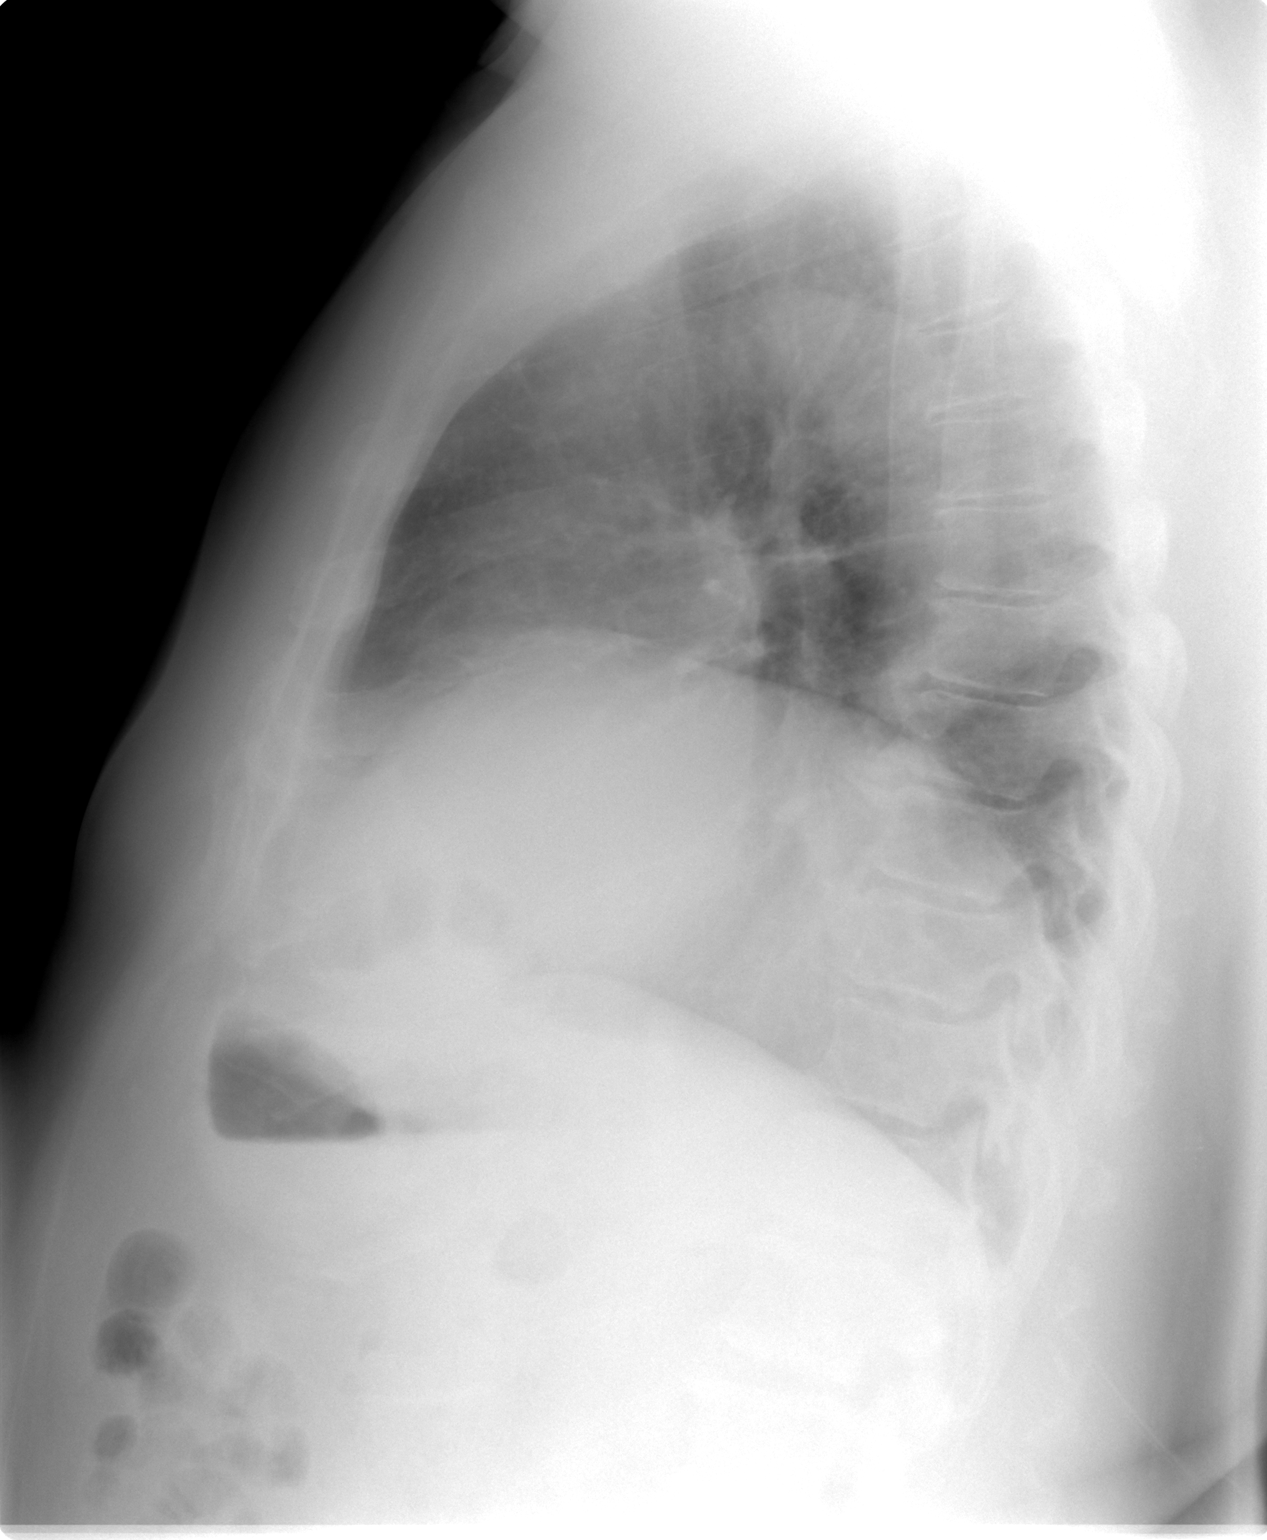

[2 of 2 positions shown; findings below may reference images not displayed]

FINDINGS: Cardiomediastinal silhouette is unremarkable.
Significant elevation of the right hemidiaphragm.  No acute
infiltrate or pleural effusion.  No pulmonary edema.  Degenerative
changes thoracic spine.  No adenopathy.
IMPRESSION: No active disease.  Significant elevation of the right
hemidiaphragm.  Degenerative changes thoracic spine.  No
adenopathy.

## 2013-02-11 ENCOUNTER — Other Ambulatory Visit: Payer: Self-pay | Admitting: Family Medicine

## 2013-02-11 DIAGNOSIS — R7611 Nonspecific reaction to tuberculin skin test without active tuberculosis: Secondary | ICD-10-CM

## 2013-02-11 LAB — HIV ANTIBODY (ROUTINE TESTING W REFLEX): HIV: NONREACTIVE

## 2013-02-11 MED ORDER — ISONIAZID 300 MG PO TABS: 300.0000 mg | ORAL_TABLET | Freq: Every day | ORAL | Status: DC

## 2013-02-11 MED ORDER — B-6 50 MG PO TABS: 1.0000 | ORAL_TABLET | Freq: Every day | ORAL | Status: DC

## 2013-02-24 ENCOUNTER — Telehealth: Payer: Self-pay | Admitting: Family Medicine

## 2013-02-24 DIAGNOSIS — R7611 Nonspecific reaction to tuberculin skin test without active tuberculosis: Secondary | ICD-10-CM

## 2013-02-24 NOTE — Telephone Encounter (Signed)
Patient Information:  Caller Name: Keithen  Phone: [phone]  Patient: Dominic Turner, Dominic Turner  Gender: Male  DOB: [birth year]  Age: 67 Years  PCP: Eustaquio Boyden [facility])  Office Follow Up:  Does the office need to follow up with this patient?: Yes  Instructions For The Office: Is there any other medication pt can take besides Isoniazid?   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  Started med 02/11/13 and now it is making him sick.   Having nausea, abdominal  pain and diarrhea started 02/13/13.  Last Diarrhea stool was this AM 12/27/12 and going 2-3 times per /day.  No vomiting.  Reviewed Health History In EMR: Yes  Reviewed Medications In EMR: Yes  Reviewed Allergies In EMR: No  Reviewed Surgeries / Procedures: Yes  Date of Onset of Symptoms: 02/13/2013  Guideline(s) Used:  Diarrhea  Disposition Per Guideline:   Go to Office Now  Reason For Disposition Reached:   Age > 60 years and has had > 6 diarrhea stools in past 24 hours  Advice Given:  Call Back If:  You become worse.  RN Overrode Recommendation:  Patient Requests Prescription  Is these any other medication pt can take besides that Isoniazid that will not cause as much gastric upset?  Does pt need to be reseen in the office?  Please call pt to advise.

## 2013-02-24 NOTE — Telephone Encounter (Signed)
Let's hold isoniazid for now.  We will soon call them with plan 

## 2013-02-25 NOTE — Telephone Encounter (Signed)
Patient notified

## 2013-02-26 MED ORDER — RIFAMPIN 300 MG PO CAPS: 600.0000 mg | ORAL_CAPSULE | Freq: Every day | ORAL | Status: DC

## 2013-02-26 NOTE — Telephone Encounter (Addendum)
plz ensure no EtOH involved. Let's stop isoniazid and B6 vitamin. Start rifampin daily for 4 months.   Come in every month for blood work (LFTs and CBC) Sent this med in.

## 2013-02-26 NOTE — Addendum Note (Signed)
Addended by: Eustaquio Boyden on: 02/26/2013 07:59 AM   Modules accepted: Orders, Medications

## 2013-02-27 ENCOUNTER — Telehealth: Payer: Self-pay | Admitting: Family Medicine

## 2013-02-27 NOTE — Telephone Encounter (Signed)
Noted. Thanks.

## 2013-02-27 NOTE — Telephone Encounter (Signed)
[facility] Triage Call Report Triage Record Num: [id number] Operator: Griselda Miner Patient Name: Dominic Turner Call Date & Time: 02/26/2013 5:24:15PM Patient Phone: [phone] PCP: Eustaquio Boyden Patient Gender: Male PCP Fax : [phone] Patient DOB: 08/06/1946 Practice Name: [facility] Reason for Call: Caller: [name]; PCP: Eustaquio Boyden [facility]); CB#: [phone]; Call regarding Medication concern; Patient calls to ask does his doctor want him to start an antibiotic. In review of the chart RN noticed that on 02/24/13 provider ordered the discontinuation of the Isoniazid and B6. On 02/26/13 medication for Rafampin was ordered. RN reviewed instructions with caller who demonstrated understanding. Changes were made because he was having diarrhea. He had questions about his wife treatment as well, but will call back in the morning for assistance. Protocol(s) Used: Office Note Recommended Outcome per Protocol: Information Noted and Sent to Office Reason for Outcome: Caller information to office Care Advice: ~ 02/26/2013 5:42:52PM Page 1 of 1 [id number]

## 2013-02-28 NOTE — Telephone Encounter (Signed)
Patient notified.  Lab appt scheduled.

## 2013-02-28 NOTE — Telephone Encounter (Signed)
Message left for patient to return my call.  

## 2013-03-03 HISTORY — PX: COLONOSCOPY: SHX174

## 2013-03-06 ENCOUNTER — Encounter: Payer: Self-pay | Admitting: Radiology

## 2013-03-06 DIAGNOSIS — R7611 Nonspecific reaction to tuberculin skin test without active tuberculosis: Secondary | ICD-10-CM | POA: Insufficient documentation

## 2013-04-02 ENCOUNTER — Encounter (INDEPENDENT_AMBULATORY_CARE_PROVIDER_SITE_OTHER): Payer: Medicare Other

## 2013-04-02 ENCOUNTER — Encounter: Payer: Self-pay | Admitting: Family Medicine

## 2013-04-02 DIAGNOSIS — Z136 Encounter for screening for cardiovascular disorders: Secondary | ICD-10-CM

## 2013-04-02 DIAGNOSIS — Z0000 Encounter for general adult medical examination without abnormal findings: Secondary | ICD-10-CM

## 2013-04-03 ENCOUNTER — Other Ambulatory Visit: Payer: Self-pay | Admitting: Family Medicine

## 2013-04-03 ENCOUNTER — Other Ambulatory Visit (INDEPENDENT_AMBULATORY_CARE_PROVIDER_SITE_OTHER): Payer: Medicare Other

## 2013-04-03 ENCOUNTER — Encounter: Payer: Self-pay | Admitting: *Deleted

## 2013-04-03 DIAGNOSIS — I10 Essential (primary) hypertension: Secondary | ICD-10-CM

## 2013-04-03 DIAGNOSIS — R7611 Nonspecific reaction to tuberculin skin test without active tuberculosis: Secondary | ICD-10-CM

## 2013-04-03 DIAGNOSIS — R718 Other abnormality of red blood cells: Secondary | ICD-10-CM

## 2013-04-03 LAB — CBC WITH DIFFERENTIAL/PLATELET
Basophils Absolute: 0.1 10*3/uL (ref 0.0–0.1)
Eosinophils Absolute: 0.3 10*3/uL (ref 0.0–0.7)
Eosinophils Relative: 7 % — ABNORMAL HIGH (ref 0.0–5.0)
HCT: 48.1 % (ref 39.0–52.0)
Lymphs Abs: 1.6 10*3/uL (ref 0.7–4.0)
MCHC: 32.5 g/dL (ref 30.0–36.0)
MCV: 72.8 fl — ABNORMAL LOW (ref 78.0–100.0)
Monocytes Absolute: 0.5 10*3/uL (ref 0.1–1.0)
Neutrophils Relative %: 45.1 % (ref 43.0–77.0)
Platelets: 197 10*3/uL (ref 150.0–400.0)
RDW: 20 % — ABNORMAL HIGH (ref 11.5–14.6)

## 2013-04-03 LAB — COMPREHENSIVE METABOLIC PANEL
ALT: 16 U/L (ref 0–53)
CO2: 26 mEq/L (ref 19–32)
Creatinine, Ser: 1.1 mg/dL (ref 0.4–1.5)
GFR: 89.09 mL/min (ref >60.00)
Total Bilirubin: 0.4 mg/dL (ref 0.3–1.2)

## 2013-04-03 LAB — HEPATIC FUNCTION PANEL
ALT: 16 U/L (ref 0–53)
AST: 15 U/L (ref 0–37)
Alkaline Phosphatase: 90 U/L (ref 39–117)
Bilirubin, Direct: 0.2 mg/dL (ref 0.0–0.3)
Total Bilirubin: 0.4 mg/dL (ref 0.3–1.2)

## 2013-04-10 DIAGNOSIS — D563 Thalassemia minor: Secondary | ICD-10-CM | POA: Insufficient documentation

## 2013-04-10 HISTORY — DX: Thalassemia minor: D56.3

## 2013-04-14 ENCOUNTER — Encounter: Payer: Self-pay | Admitting: Family Medicine

## 2013-04-30 ENCOUNTER — Other Ambulatory Visit (INDEPENDENT_AMBULATORY_CARE_PROVIDER_SITE_OTHER): Payer: Medicare Other

## 2013-04-30 ENCOUNTER — Encounter: Payer: Self-pay | Admitting: Family Medicine

## 2013-04-30 DIAGNOSIS — E7800 Pure hypercholesterolemia, unspecified: Secondary | ICD-10-CM

## 2013-04-30 DIAGNOSIS — R718 Other abnormality of red blood cells: Secondary | ICD-10-CM

## 2013-04-30 DIAGNOSIS — I10 Essential (primary) hypertension: Secondary | ICD-10-CM

## 2013-04-30 DIAGNOSIS — R7611 Nonspecific reaction to tuberculin skin test without active tuberculosis: Secondary | ICD-10-CM

## 2013-04-30 LAB — CBC WITH DIFFERENTIAL/PLATELET
Basophils Relative: 0 % (ref 0.0–3.0)
Eosinophils Relative: 5.9 % — ABNORMAL HIGH (ref 0.0–5.0)
Hemoglobin: 16.1 g/dL (ref 13.0–17.0)
MCV: 73.1 fl — ABNORMAL LOW (ref 78.0–100.0)
Monocytes Absolute: 0.5 10*3/uL (ref 0.1–1.0)
Neutro Abs: 1.9 10*3/uL (ref 1.4–7.7)
Neutrophils Relative %: 40.7 % — ABNORMAL LOW (ref 43.0–77.0)
RBC: 6.71 Mil/uL — ABNORMAL HIGH (ref 4.22–5.81)
WBC: 4.8 10*3/uL (ref 4.5–10.5)

## 2013-04-30 LAB — HEPATIC FUNCTION PANEL
ALT: 14 U/L (ref 0–53)
Total Bilirubin: 0.5 mg/dL (ref 0.3–1.2)

## 2013-04-30 LAB — IBC PANEL
Saturation Ratios: 32.8 % (ref 20.0–50.0)
Transferrin: 174.2 mg/dL — ABNORMAL LOW (ref 212.0–360.0)

## 2013-04-30 LAB — FERRITIN: Ferritin: 84.6 ng/mL (ref 22.0–322.0)

## 2013-05-01 ENCOUNTER — Encounter: Payer: Self-pay | Admitting: *Deleted

## 2013-05-19 ENCOUNTER — Other Ambulatory Visit: Payer: Medicare Other

## 2013-06-28 ENCOUNTER — Telehealth: Payer: Self-pay | Admitting: Family Medicine

## 2013-06-28 MED ORDER — GRISEOFULVIN MICROSIZE 500 MG PO TABS: 500.0000 mg | ORAL_TABLET | Freq: Every day | ORAL | Status: AC

## 2013-06-28 NOTE — Telephone Encounter (Signed)
I see pt coming in Monday afternoon for scalp concern.  If same issue as prior, we can do another 1 mo course of griseofulvin and pt may make f/u app in 1 mo (doesn't need to come in to be seen Monday).  Sent med ot pharmacy.

## 2013-06-30 ENCOUNTER — Ambulatory Visit: Payer: Medicare Other | Admitting: Family Medicine

## 2013-06-30 NOTE — Telephone Encounter (Signed)
Spoke with patient. Same issue. Cancelled appt. Will follow up in 1 month if still occurring.

## 2013-07-01 ENCOUNTER — Ambulatory Visit: Payer: Medicare Other | Admitting: Family Medicine

## 2013-07-27 ENCOUNTER — Other Ambulatory Visit: Payer: Self-pay | Admitting: Family Medicine

## 2013-12-15 ENCOUNTER — Other Ambulatory Visit: Payer: Self-pay | Admitting: Family Medicine

## 2013-12-15 ENCOUNTER — Ambulatory Visit (INDEPENDENT_AMBULATORY_CARE_PROVIDER_SITE_OTHER)
Admission: RE | Admit: 2013-12-15 | Discharge: 2013-12-15 | Disposition: A | Discharge status: Home / Self Care | Payer: Medicare Other | Source: Ambulatory Visit | Attending: Family Medicine | Admitting: Family Medicine

## 2013-12-15 ENCOUNTER — Encounter: Payer: Self-pay | Admitting: *Deleted

## 2013-12-15 DIAGNOSIS — R059 Cough, unspecified: Secondary | ICD-10-CM

## 2013-12-15 DIAGNOSIS — R05 Cough: Secondary | ICD-10-CM

## 2013-12-15 IMAGING — CR DG CHEST 2V
2 series · 2 of 2 positions shown · non-contrast
Comparison: [DATE]

CLINICAL DATA: Cough and wheezing

EXAM:
CHEST  2 VIEW

[view not recorded (1 of 2)]
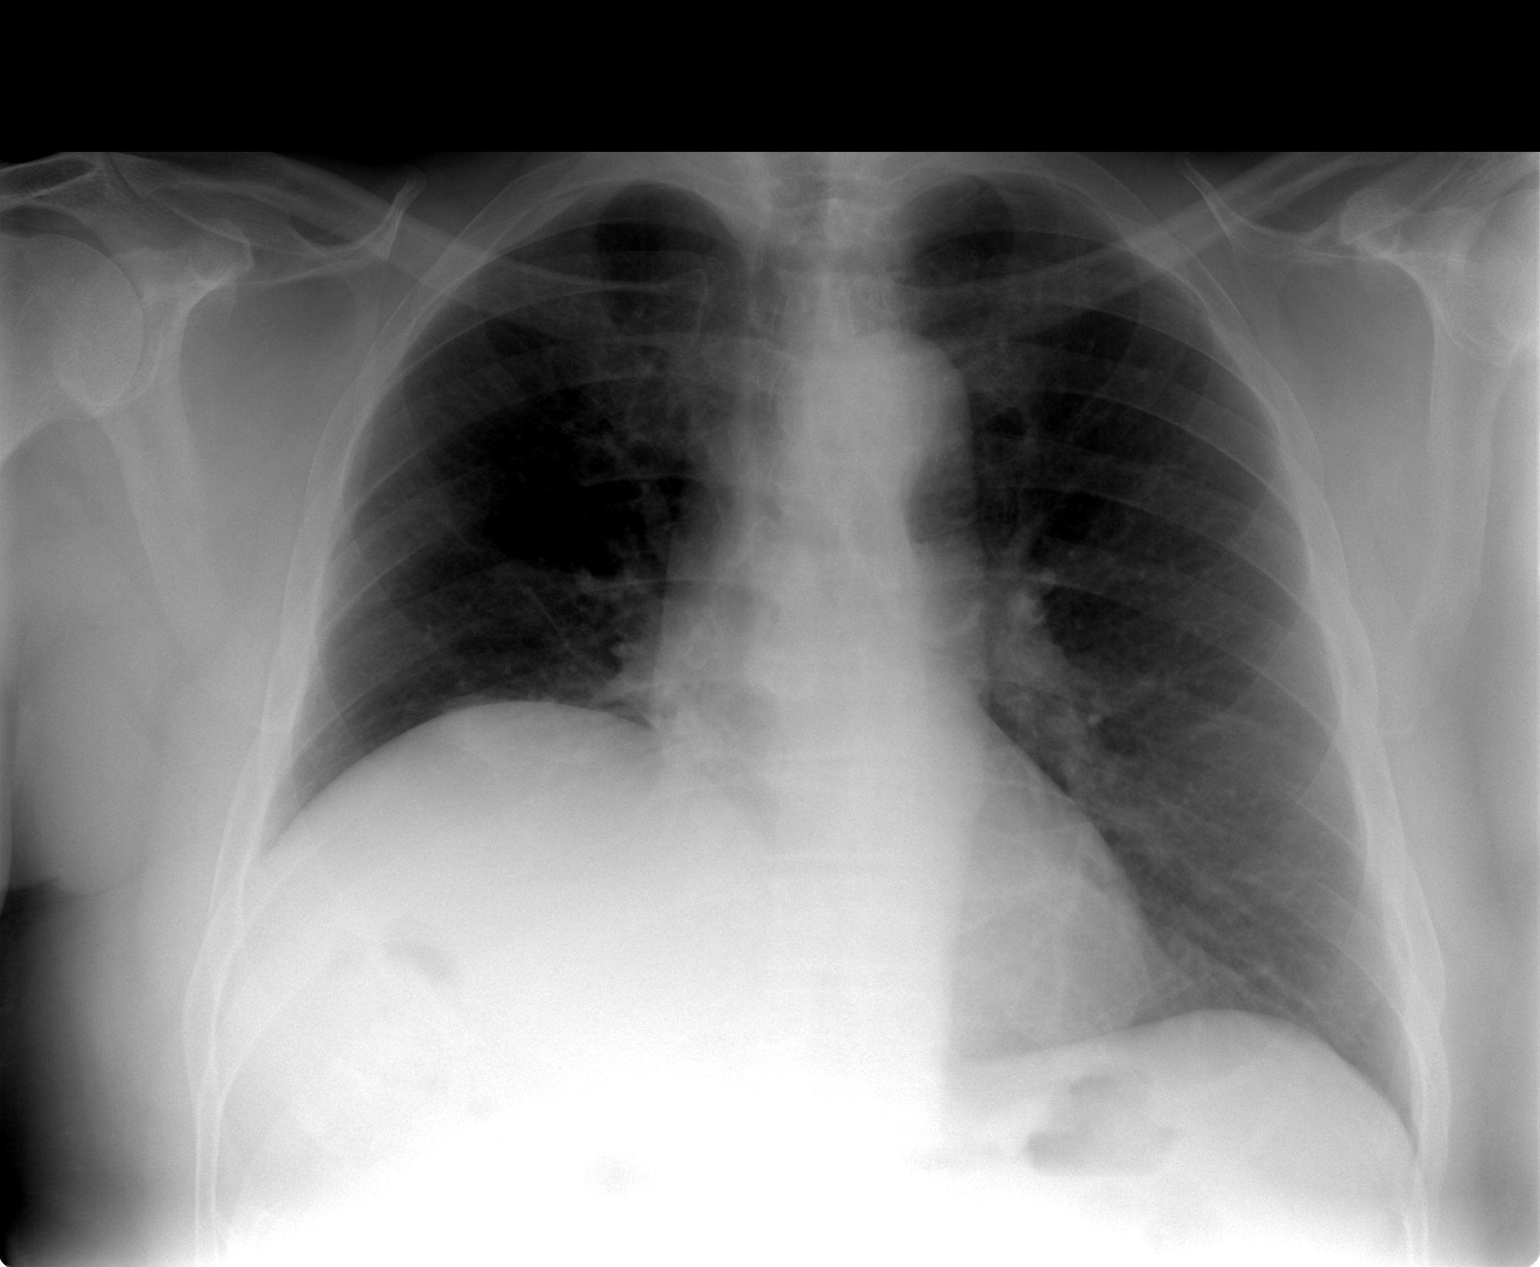

[view not recorded (2 of 2)]
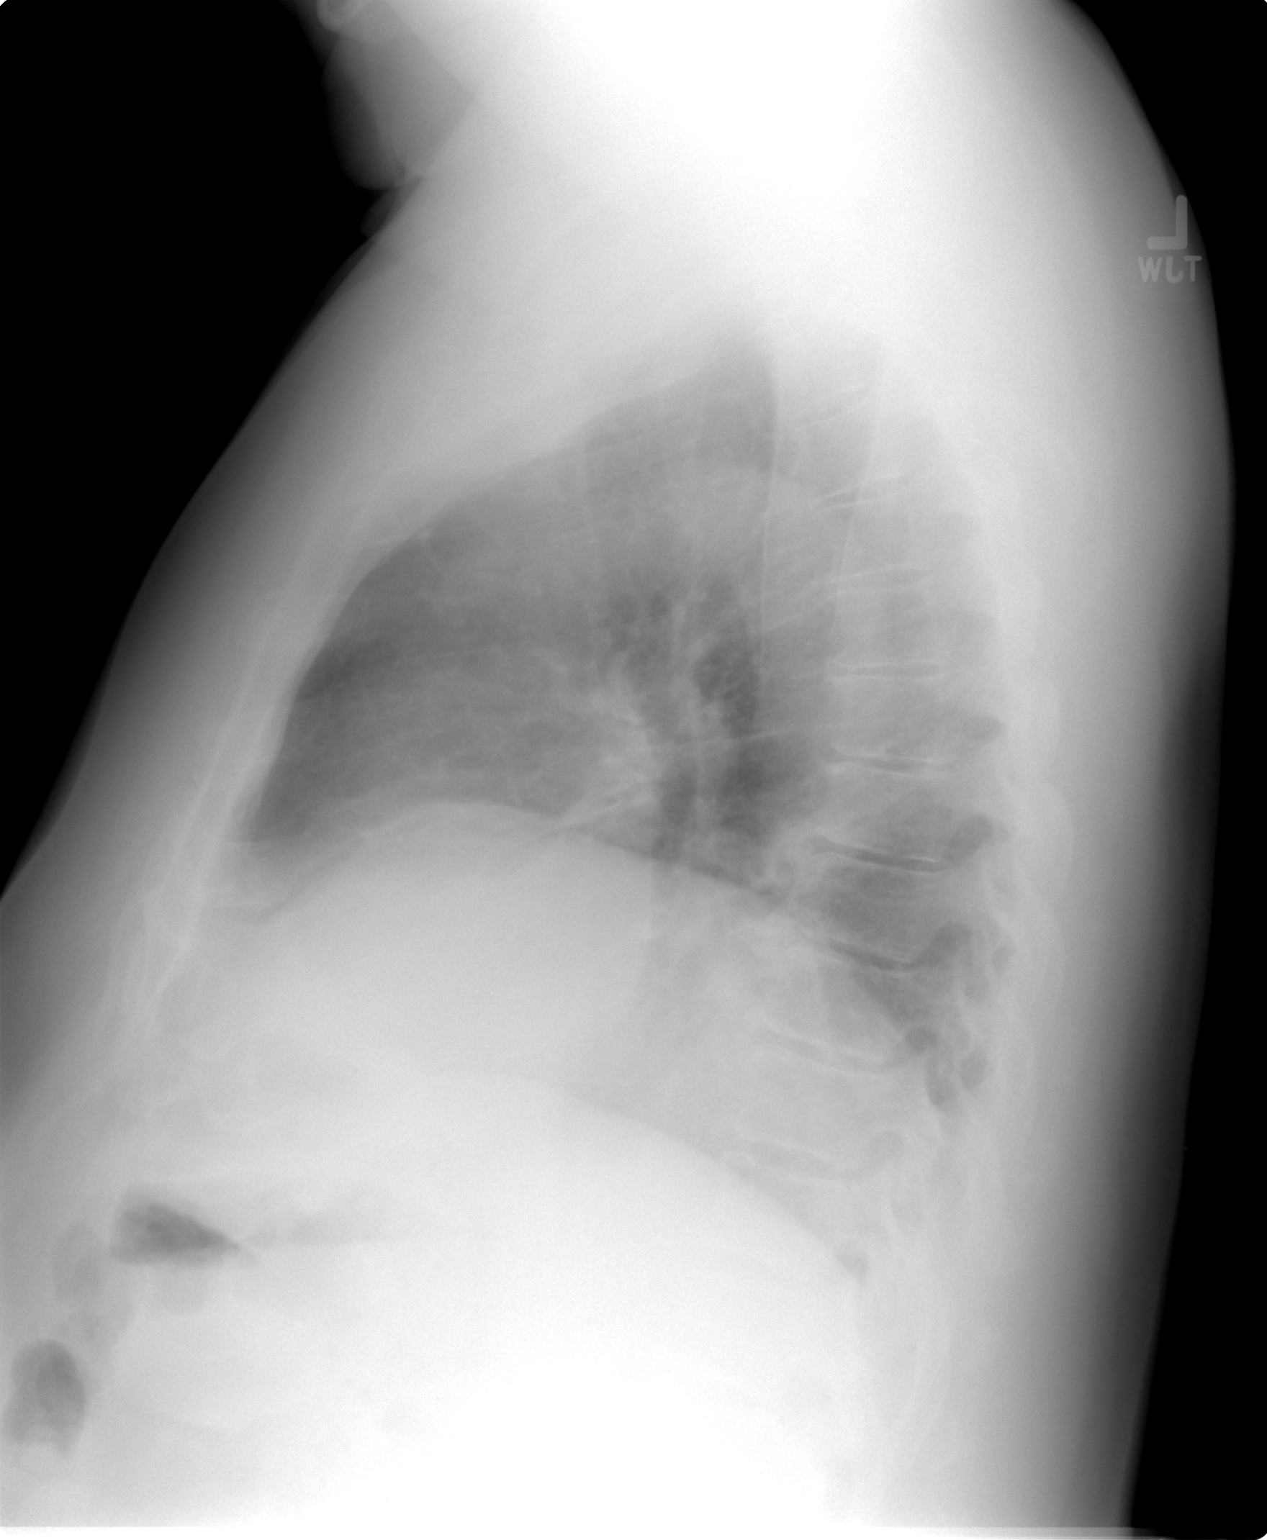

[2 of 2 positions shown; findings below may reference images not displayed]

FINDINGS: The right hemidiaphragm is again elevated. The cardiac shadow is
stable. The lungs are clear bilaterally. No acute bony abnormality
is noted.
IMPRESSION: No change from the prior exam.

## 2013-12-23 ENCOUNTER — Other Ambulatory Visit: Payer: Self-pay | Admitting: Family Medicine

## 2014-01-09 ENCOUNTER — Encounter: Payer: Self-pay | Admitting: Family Medicine

## 2014-01-09 ENCOUNTER — Ambulatory Visit (INDEPENDENT_AMBULATORY_CARE_PROVIDER_SITE_OTHER): Payer: Medicare Other | Admitting: Family Medicine

## 2014-01-09 VITALS — BP 130/70 | HR 88 | Temp 98.2°F | Ht 69.0 in | Wt 252.0 lb

## 2014-01-09 DIAGNOSIS — I10 Essential (primary) hypertension: Secondary | ICD-10-CM

## 2014-01-09 DIAGNOSIS — D563 Thalassemia minor: Secondary | ICD-10-CM

## 2014-01-09 DIAGNOSIS — Z23 Encounter for immunization: Secondary | ICD-10-CM

## 2014-01-09 DIAGNOSIS — E7800 Pure hypercholesterolemia, unspecified: Secondary | ICD-10-CM

## 2014-01-09 DIAGNOSIS — Z125 Encounter for screening for malignant neoplasm of prostate: Secondary | ICD-10-CM

## 2014-01-09 DIAGNOSIS — E663 Overweight: Secondary | ICD-10-CM

## 2014-01-09 DIAGNOSIS — Z0000 Encounter for general adult medical examination without abnormal findings: Secondary | ICD-10-CM

## 2014-01-09 DIAGNOSIS — F17200 Nicotine dependence, unspecified, uncomplicated: Secondary | ICD-10-CM

## 2014-01-09 LAB — CBC WITH DIFFERENTIAL/PLATELET
Basophils Absolute: 0 10*3/uL (ref 0.0–0.1)
Basophils Relative: 0.6 % (ref 0.0–3.0)
Eosinophils Absolute: 0.4 10*3/uL (ref 0.0–0.7)
Eosinophils Relative: 6.2 % — ABNORMAL HIGH (ref 0.0–5.0)
HCT: 49.8 % (ref 39.0–52.0)
HEMOGLOBIN: 16 g/dL (ref 13.0–17.0)
LYMPHS PCT: 35.3 % (ref 12.0–46.0)
Lymphs Abs: 2.3 10*3/uL (ref 0.7–4.0)
MCHC: 32 g/dL (ref 30.0–36.0)
MCV: 77.2 fl — ABNORMAL LOW (ref 78.0–100.0)
MONOS PCT: 10.7 % (ref 3.0–12.0)
Monocytes Absolute: 0.7 10*3/uL (ref 0.1–1.0)
NEUTROS ABS: 3 10*3/uL (ref 1.4–7.7)
Neutrophils Relative %: 47.2 % (ref 43.0–77.0)
Platelets: 196 10*3/uL (ref 150.0–400.0)
RBC: 6.46 Mil/uL — ABNORMAL HIGH (ref 4.22–5.81)
RDW: 18 % — AB (ref 11.5–14.6)
WBC: 6.4 10*3/uL (ref 4.5–10.5)

## 2014-01-09 LAB — COMPREHENSIVE METABOLIC PANEL
ALT: 26 U/L (ref 0–53)
AST: 24 U/L (ref 0–37)
Albumin: 4 g/dL (ref 3.5–5.2)
Alkaline Phosphatase: 92 U/L (ref 39–117)
BUN: 9 mg/dL (ref 6–23)
CO2: 28 meq/L (ref 19–32)
Calcium: 9.4 mg/dL (ref 8.4–10.5)
Chloride: 104 mEq/L (ref 96–112)
Creatinine, Ser: 1 mg/dL (ref 0.4–1.5)
GFR: 96.1 mL/min (ref >60.00)
GLUCOSE: 87 mg/dL (ref 70–99)
Potassium: 4 mEq/L (ref 3.5–5.1)
SODIUM: 139 meq/L (ref 135–145)
Total Bilirubin: 0.9 mg/dL (ref 0.3–1.2)
Total Protein: 6.5 g/dL (ref 6.0–8.3)

## 2014-01-09 LAB — LIPID PANEL
Cholesterol: 187 mg/dL (ref 0–200)
HDL: 44 mg/dL (ref >39.00)
LDL Cholesterol: 121 mg/dL — ABNORMAL HIGH (ref 0–99)
Total CHOL/HDL Ratio: 4
Triglycerides: 112 mg/dL (ref 0.0–149.0)
VLDL: 22.4 mg/dL (ref 0.0–40.0)

## 2014-01-09 LAB — TSH: TSH: 0.65 u[IU]/mL (ref 0.35–5.50)

## 2014-01-09 LAB — PSA, MEDICARE: PSA: 0.78 ng/ml (ref 0.10–4.00)

## 2014-01-09 MED ORDER — FLUTICASONE PROPIONATE 50 MCG/ACT NA SUSP: 2.0000 | Freq: Every day | NASAL | Status: DC

## 2014-01-09 MED ORDER — AMLODIPINE BESYLATE 10 MG PO TABS: ORAL_TABLET | ORAL | Status: DC

## 2014-01-09 MED ORDER — PRAVASTATIN SODIUM 40 MG PO TABS: ORAL_TABLET | ORAL | Status: DC

## 2014-01-09 MED ORDER — BUPROPION HCL ER (SR) 150 MG PO TB12: 150.0000 mg | ORAL_TABLET | Freq: Two times a day (BID) | ORAL | Status: DC

## 2014-01-09 NOTE — Assessment & Plan Note (Signed)
Check FLP today. 

## 2014-01-09 NOTE — Assessment & Plan Note (Addendum)
I have personally reviewed the Medicare Annual Wellness questionnaire and have noted 1. The patient's medical and social history 2. Their use of alcohol, tobacco or illicit drugs 3. Their current medications and supplements 4. The patient's functional ability including ADL's, fall risks, home safety risks and hearing or visual impairment. 5. Diet and physical activity 6. Evidence for depression or mood disorders The patients weight, height, BMI have been recorded in the chart.  Hearing and vision has been addressed. I have made referrals, counseling and provided education to the patient based review of the above and I have provided the pt with a written personalized care plan for preventive services. See scanned questionairre. Advanced directives discussed:   Reviewed preventative protocols and updated unless pt declined. 

## 2014-01-09 NOTE — Assessment & Plan Note (Signed)
Longtime smoker - recent increase in cigarette use. Action phase - requests trial of wellbutrin.   Prescribed today.

## 2014-01-09 NOTE — Assessment & Plan Note (Signed)
Chronic, stable. Continue med. 

## 2014-01-09 NOTE — Patient Instructions (Signed)
Pneumonia shot today.  We will discuss second pneumonia shot next year as well. Blood work today. Good to see you today. Return at your convenience for spirometry (breathing test).

## 2014-01-09 NOTE — Addendum Note (Signed)
Addended by: Ria Bush on: 01/09/2014 09:17 AM   Modules accepted: Orders

## 2014-01-09 NOTE — Addendum Note (Signed)
Addended by: Royann Shivers A on: 01/09/2014 09:58 AM   Modules accepted: Orders

## 2014-01-09 NOTE — Progress Notes (Signed)
Subjective:    Patient ID: Dominic Turner, male    DOB: [birth year], 68 y.o.   MRN: [id number]  HPI CC: medicare wellness  Wife lives in [place] - at [facility] (memory unit).   Wt Readings from Last 3 Encounters:  01/09/14 252 lb (114.306 kg)  12/27/12 249 lb 12 oz (113.286 kg)  11/15/12 245 lb 4 oz (111.245 kg)  Body mass index is 37.2 kg/(m^2).  Smoking - up to 1 ppd. Interested in medication to help him quit.  Has not used anything to help in past.  Up from 1/2 ppd in past.  Has been told has COPD in past.  Also endorses IBS sxs.  Vision screen normal  Hearing screen - failed R ear, L ear high frequencies. Evaluated by audiologist last year - told needed hearing aides.  Aware he needs this and will decide in the next month to buy them. No falls.  No depression, anhedonia.   Preventative:  Colonoscopy - 02/2013 with 3 polyps, rec rpt 3 yrs - Dr. Cristina Gong.  prostate - always normal, due today - requests rpt Flu shot - 10/2013 pneumovax - due today. Td 2009  Shingles shot - 10/2013 Advanced directives: has living will at home for himself and wife. HCPOA - son.  Medications and allergies reviewed and updated in chart.  Past histories reviewed and updated if relevant as below. Patient Active Problem List   Diagnosis Date Noted  . Nonspecific reaction to tuberculin skin test without active tuberculosis 03/06/2013  . 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 Medicare welcome exam 12/27/2012  . Hearing loss 12/27/2012  . Tinea capitis 04/17/2012  . ERECTILE DYSFUNCTION 03/25/2009  . ANXIETY DEPRESSION 10/23/2008  . OBSTRUCTIVE SLEEP APNEA 06/26/2008  . GASTRITIS 10/09/2007  . RECTAL BLEEDING 10/09/2007  . COLONIC POLYPS, HX OF 10/09/2007  . PROSTATITIS, HX OF 10/09/2007  . HYPERCHOLESTEROLEMIA, PURE 05/03/2007  . OVERWEIGHT 05/03/2007  . TOBACCO ABUSE 05/03/2007  . HYPERTENSION, BENIGN ESSENTIAL 05/03/2007   Past Medical History  Diagnosis Date  . Pure hypercholesterolemia    . Essential hypertension, benign   . Unspecified sleep apnea   . Unspecified gastritis and gastroduodenitis without mention of hemorrhage   . History of adenomatous polyp of colon 02/2013    rec rpt 3 yrs  . Tobacco use disorder   . Overweight   . Bilateral high frequency sensorineural hearing loss     rec hearing aides by ENT  . Diverticulosis 02/2013    by colonoscopy  . 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 Positive PPD, treated 2014    s/p rifampin   Past Surgical History  Procedure Laterality Date  . Colonoscopy  03/03/2013    tubular adenoma x3, diverticulosis, rpt 3 yrs (Dr. Cristina Gong)   History  Substance Use Topics  . Smoking status: Current Every Day Smoker -- 0.50 packs/day    Types: Cigarettes    Start date: 12/12/1983  . Smokeless tobacco: Never Used  . Alcohol Use: No   Family History  Problem Relation Age of Onset  . Cirrhosis Father     died in 63's  . Hypertension Mother   . Heart attack Sister   . Stroke Sister   . Heart attack Maternal Grandmother   . Stroke Maternal Grandmother   . Cancer Neg Hx    Allergies  Allergen Reactions  . Aspirin Other (See Comments)    REACTION: gi upset; baby ASA ok  .  Sertraline Hcl     REACTION: sexual dysfunction, etc  . Sulfonamide Derivatives    Current Outpatient Prescriptions on File Prior to Visit  Medication Sig Dispense Refill  . aspirin 81 MG tablet Take 81 mg by mouth daily.        . Multiple Vitamin Essential TABS Take 1 tablet by mouth daily.         No current facility-administered medications on file prior to visit.     Review of Systems  Constitutional: Negative for fever, chills, activity change, appetite change, fatigue and unexpected weight change.  HENT: Negative for hearing loss.   Eyes: Negative for visual disturbance.  Respiratory: Positive for cough and wheezing. Negative for chest tightness and shortness of breath.   Cardiovascular:  Negative for chest pain, palpitations and leg swelling.  Gastrointestinal: Negative for nausea, vomiting, abdominal pain, diarrhea, constipation, blood in stool and abdominal distention.  Genitourinary: Negative for hematuria and difficulty urinating.  Musculoskeletal: Negative for arthralgias, myalgias and neck pain.  Skin: Negative for rash.  Neurological: Negative for dizziness, seizures, syncope and headaches.  Hematological: Negative for adenopathy. Does not bruise/bleed easily.  Psychiatric/Behavioral: Negative for dysphoric mood. The patient is not nervous/anxious.        Objective:   Physical Exam  Nursing note and vitals reviewed. Constitutional: He is oriented to person, place, and time. He appears well-developed and well-nourished. No distress.  HENT:  Head: Normocephalic and atraumatic.  Right Ear: Hearing, tympanic membrane, external ear and ear canal normal.  Left Ear: Hearing, tympanic membrane, external ear and ear canal normal.  Nose: Nose normal.  Mouth/Throat: Oropharynx is clear and moist. No oropharyngeal exudate.  Eyes: Conjunctivae and EOM are normal. Pupils are equal, round, and reactive to light. No scleral icterus.  Neck: Normal range of motion. Neck supple. Carotid bruit is not present. No thyromegaly present.  Cardiovascular: Normal rate, regular rhythm, normal heart sounds and intact distal pulses.   No murmur heard. Pulses:      Radial pulses are 2+ on the right side, and 2+ on the left side.  Pulmonary/Chest: Effort normal and breath sounds normal. No respiratory distress. He has no wheezes. He has no rales.  Abdominal: Soft. Bowel sounds are normal. He exhibits no distension and no mass. There is no tenderness. There is no rebound and no guarding.  Genitourinary: Rectum normal and prostate normal. Rectal exam shows no external hemorrhoid, no internal hemorrhoid, no fissure, no mass, no tenderness and anal tone normal. Prostate is not enlarged (~20gm) and  not tender.  Musculoskeletal: Normal range of motion. He exhibits no edema.  Lymphadenopathy:    He has no cervical adenopathy.  Neurological: He is alert and oriented to person, place, and time.  CN grossly intact, station and gait intact  Skin: Skin is warm and dry. No rash noted.  Psychiatric: He has a normal mood and affect. His behavior is normal. Judgment and thought content normal.       Assessment & Plan:

## 2014-01-09 NOTE — Assessment & Plan Note (Signed)
Body mass index is 37.2 kg/(m^2).

## 2014-01-12 ENCOUNTER — Encounter: Payer: Self-pay | Admitting: *Deleted

## 2014-01-14 ENCOUNTER — Telehealth: Payer: Self-pay | Admitting: Family Medicine

## 2014-01-14 NOTE — Telephone Encounter (Signed)
Relevant patient education mailed to patient.  

## 2014-03-11 DIAGNOSIS — J449 Chronic obstructive pulmonary disease, unspecified: Secondary | ICD-10-CM

## 2014-03-11 HISTORY — DX: Chronic obstructive pulmonary disease, unspecified: J44.9

## 2014-04-09 ENCOUNTER — Ambulatory Visit: Payer: Medicare Other | Admitting: Family Medicine

## 2014-04-09 ENCOUNTER — Ambulatory Visit (INDEPENDENT_AMBULATORY_CARE_PROVIDER_SITE_OTHER): Payer: Medicare Other | Admitting: Family Medicine

## 2014-04-09 ENCOUNTER — Encounter: Payer: Self-pay | Admitting: Family Medicine

## 2014-04-09 VITALS — BP 137/84 | HR 86 | Temp 97.9°F | Wt 247.8 lb

## 2014-04-09 DIAGNOSIS — J449 Chronic obstructive pulmonary disease, unspecified: Secondary | ICD-10-CM | POA: Insufficient documentation

## 2014-04-09 DIAGNOSIS — R0600 Dyspnea, unspecified: Secondary | ICD-10-CM

## 2014-04-09 DIAGNOSIS — R0609 Other forms of dyspnea: Secondary | ICD-10-CM

## 2014-04-09 DIAGNOSIS — F17200 Nicotine dependence, unspecified, uncomplicated: Secondary | ICD-10-CM

## 2014-04-09 DIAGNOSIS — R0989 Other specified symptoms and signs involving the circulatory and respiratory systems: Secondary | ICD-10-CM

## 2014-04-09 MED ORDER — TIOTROPIUM BROMIDE MONOHYDRATE 18 MCG IN CAPS: 18.0000 ug | ORAL_CAPSULE | Freq: Every day | RESPIRATORY_TRACT | Status: DC

## 2014-04-09 MED ORDER — ALBUTEROL SULFATE (2.5 MG/3ML) 0.083% IN NEBU
2.5000 mg | INHALATION_SOLUTION | Freq: Once | RESPIRATORY_TRACT | Status: AC
  Administered 2014-04-09: 2.5 mg via RESPIRATORY_TRACT

## 2014-04-09 NOTE — Addendum Note (Signed)
Addended by: Royann Shivers A on: 04/09/2014 03:40 PM   Modules accepted: Orders

## 2014-04-09 NOTE — Progress Notes (Signed)
BP 137/84  Pulse 86  Temp(Src) 97.9 F (36.6 C) (Oral)  Wt 247 lb 12.8 oz (112.401 kg)   CC: spirometry  Subjective:    Patient ID: Dominic Turner, male    DOB: [birth year], 68 y.o.   MRN: [id number]  HPI: Dominic Turner is a 68 y.o. male presenting on 04/09/2014 for Follow-up   Longtime smoker presents today for spirometry to further eval for COPD.   Current every day smoker since 1985, averaging 1/2 ppd, total ~30 PY hx - 10-15 yrs did smoke 1 ppd. Smoking - 1/2 ppd- tried wellbutrin which caused facial outbreak and trouble sleeping at night time.  Does not tend to get recurrent bronchitis infections.  No current fever or cough. Stays intermittently short winded even at rest, worse with exertion or climbing a flight of stairs. Has been told has COPD - years ago. H/o chronically elevated R hemidiaphragm.  Pneumovax done 2015.  prevnar due 2016. No h/o asthma +h/o allergic rhinitis - controlled with flonase.  Does not take antihistamine for concern it would raise his blood pressure.  CHEST 2 VIEW  COMPARISON: 03/09/2013  FINDINGS:  The right hemidiaphragm is again elevated. The cardiac shadow is stable. The lungs are clear bilaterally. No acute bony abnormality is noted.  IMPRESSION:  No change from the prior exam.  Electronically Signed  By: Inez Catalina M.D.  On: 12/15/2013 13:40   Relevant past medical, surgical, family and social history reviewed and updated as indicated.  Allergies and medications reviewed and updated. Current Outpatient Prescriptions on File Prior to Visit  Medication Sig  . amLODipine (NORVASC) 10 MG tablet TAKE 1 TABLET (10 MG TOTAL) BY MOUTH DAILY.  Marland Kitchen aspirin 81 MG tablet Take 81 mg by mouth daily.    . fluticasone (FLONASE) 50 MCG/ACT nasal spray Place 2 sprays into both nostrils daily.  . Multiple Vitamin Essential TABS Take 1 tablet by mouth daily.    . pravastatin (PRAVACHOL) 40 MG tablet TAKE 1 TABLET (40 MG TOTAL) BY MOUTH DAILY.    No current facility-administered medications on file prior to visit.    Review of Systems Per HPI unless specifically indicated above    Objective:    BP 137/84  Pulse 86  Temp(Src) 97.9 F (36.6 C) (Oral)  Wt 247 lb 12.8 oz (112.401 kg)  Physical Exam  Nursing note and vitals reviewed. Constitutional: He appears well-developed and well-nourished. No distress.  HENT:  Mouth/Throat: Oropharynx is clear and moist. No oropharyngeal exudate.  Cardiovascular: Normal rate, regular rhythm, normal heart sounds and intact distal pulses.   No murmur heard. Pulmonary/Chest: Effort normal and breath sounds normal. No respiratory distress. He has no wheezes. He has no rales.  Musculoskeletal: Normal range of motion. He exhibits no edema.  Skin: Skin is warm and dry. No rash noted.  Psychiatric: He has a normal mood and affect.   Results for orders placed in visit on 01/09/14  LIPID PANEL      Result Value Ref Range   Cholesterol 187  0 - 200 mg/dL   Triglycerides 112.0  0.0 - 149.0 mg/dL   HDL 44.00  >39.00 mg/dL   VLDL 22.4  0.0 - 40.0 mg/dL   LDL Cholesterol 121 (*) 0 - 99 mg/dL   Total CHOL/HDL Ratio 4    COMPREHENSIVE METABOLIC PANEL      Result Value Ref Range   Sodium 139  135 - 145 mEq/L   Potassium 4.0  3.5 - 5.1 mEq/L   Chloride  104  96 - 112 mEq/L   CO2 28  19 - 32 mEq/L   Glucose, Bld 87  70 - 99 mg/dL   BUN 9  6 - 23 mg/dL   Creatinine, Ser 1.0  0.4 - 1.5 mg/dL   Total Bilirubin 0.9  0.3 - 1.2 mg/dL   Alkaline Phosphatase 92  39 - 117 U/L   AST 24  0 - 37 U/L   ALT 26  0 - 53 U/L   Total Protein 6.5  6.0 - 8.3 g/dL   Albumin 4.0  3.5 - 5.2 g/dL   Calcium 9.4  8.4 - 10.5 mg/dL   GFR 96.10  >60.00 mL/min  TSH      Result Value Ref Range   TSH 0.65  0.35 - 5.50 uIU/mL  CBC WITH DIFFERENTIAL      Result Value Ref Range   WBC 6.4  4.5 - 10.5 K/uL   RBC 6.46 (*) 4.22 - 5.81 Mil/uL   Hemoglobin 16.0  13.0 - 17.0 g/dL   HCT 49.8  39.0 - 52.0 %   MCV 77.2 (*)  78.0 - 100.0 fl   MCHC 32.0  30.0 - 36.0 g/dL   RDW 18.0 (*) 11.5 - 14.6 %   Platelets 196.0  150.0 - 400.0 K/uL   Neutrophils Relative % 47.2  43.0 - 77.0 %   Lymphocytes Relative 35.3  12.0 - 46.0 %   Monocytes Relative 10.7  3.0 - 12.0 %   Eosinophils Relative 6.2 (*) 0.0 - 5.0 %   Basophils Relative 0.6  0.0 - 3.0 %   Neutro Abs 3.0  1.4 - 7.7 K/uL   Lymphs Abs 2.3  0.7 - 4.0 K/uL   Monocytes Absolute 0.7  0.1 - 1.0 K/uL   Eosinophils Absolute 0.4  0.0 - 0.7 K/uL   Basophils Absolute 0.0  0.0 - 0.1 K/uL  PSA, MEDICARE      Result Value Ref Range   PSA 0.78  0.10 - 4.00 ng/ml      Assessment & Plan:   Problem List Items Addressed This Visit   TOBACCO ABUSE     Did not tolerate wellbutrin. Discussed different forms of NRT. Pt hesitant to take chantix.    COPD, severe     Spirometry pre/post albuterol performed today. Moderately severe obstruction, with low vital capacity. Post bronchodilator test not improved. pre: FVC 67%, FEV1 51%, ratio 0.58 post: FVC 55%, FEV1 49%,ratio 0.69 Given sxs endorsed as well as stage 3-4 COPD recommend start spiriva daily - have sent this into pharmacy  Discussed ok to use plain antihistamine OTC prn allergy sxs.    Relevant Medications      SPIRIVA HANDIHALER 18 MCG IN CAPS    Other Visit Diagnoses   Smoker    -  Primary    Relevant Orders       Spirometry with Graph (Completed)        Follow up plan: Return if symptoms worsen or fail to improve.

## 2014-04-09 NOTE — Assessment & Plan Note (Addendum)
Spirometry pre/post albuterol performed today. Moderately severe obstruction, with low vital capacity. Post bronchodilator test not improved. pre: FVC 67%, FEV1 51%, ratio 0.58 post: FVC 55%, FEV1 49%,ratio 0.69 Given sxs endorsed as well as stage 3-4 COPD recommend start spiriva daily - have sent this into pharmacy  Discussed ok to use plain antihistamine OTC prn allergy sxs.

## 2014-04-09 NOTE — Progress Notes (Signed)
Pre visit review using our clinic review tool, if applicable. No additional management support is needed unless otherwise documented below in the visit note. 

## 2014-04-09 NOTE — Assessment & Plan Note (Signed)
Did not tolerate wellbutrin. Discussed different forms of NRT. Pt hesitant to take chantix.

## 2014-04-09 NOTE — Patient Instructions (Signed)
Look into nicorette gum to help you quit smoking.  Keep working on quitting smoking.  Look at [employer] for further quitting resournces. Spirometry breathing test today - we will call you with results.

## 2014-09-22 ENCOUNTER — Other Ambulatory Visit: Payer: Self-pay | Admitting: *Deleted

## 2014-09-22 MED ORDER — AMLODIPINE BESYLATE 10 MG PO TABS: ORAL_TABLET | ORAL | Status: DC

## 2014-09-22 MED ORDER — PRAVASTATIN SODIUM 40 MG PO TABS: ORAL_TABLET | ORAL | Status: DC

## 2014-10-02 ENCOUNTER — Ambulatory Visit (INDEPENDENT_AMBULATORY_CARE_PROVIDER_SITE_OTHER): Payer: Medicare Other

## 2014-10-02 DIAGNOSIS — Z23 Encounter for immunization: Secondary | ICD-10-CM

## 2015-01-06 ENCOUNTER — Other Ambulatory Visit: Payer: Self-pay | Admitting: Family Medicine

## 2015-01-06 DIAGNOSIS — E7800 Pure hypercholesterolemia, unspecified: Secondary | ICD-10-CM

## 2015-01-06 DIAGNOSIS — I10 Essential (primary) hypertension: Secondary | ICD-10-CM

## 2015-01-06 DIAGNOSIS — Z125 Encounter for screening for malignant neoplasm of prostate: Secondary | ICD-10-CM

## 2015-01-06 DIAGNOSIS — E669 Obesity, unspecified: Secondary | ICD-10-CM

## 2015-01-08 ENCOUNTER — Other Ambulatory Visit (INDEPENDENT_AMBULATORY_CARE_PROVIDER_SITE_OTHER): Payer: Medicare Other

## 2015-01-08 DIAGNOSIS — E7800 Pure hypercholesterolemia, unspecified: Secondary | ICD-10-CM

## 2015-01-08 DIAGNOSIS — I10 Essential (primary) hypertension: Secondary | ICD-10-CM

## 2015-01-08 DIAGNOSIS — E669 Obesity, unspecified: Secondary | ICD-10-CM | POA: Diagnosis not present

## 2015-01-08 DIAGNOSIS — Z125 Encounter for screening for malignant neoplasm of prostate: Secondary | ICD-10-CM | POA: Diagnosis not present

## 2015-01-08 LAB — PSA, MEDICARE: PSA: 0.71 ng/mL (ref 0.10–4.00)

## 2015-01-08 LAB — LIPID PANEL
CHOL/HDL RATIO: 4
Cholesterol: 170 mg/dL (ref 0–200)
HDL: 39.5 mg/dL (ref >39.00)
LDL Cholesterol: 104 mg/dL — ABNORMAL HIGH (ref 0–99)
NONHDL: 130.5
Triglycerides: 134 mg/dL (ref 0.0–149.0)
VLDL: 26.8 mg/dL (ref 0.0–40.0)

## 2015-01-08 LAB — BASIC METABOLIC PANEL
BUN: 9 mg/dL (ref 6–23)
CO2: 29 mEq/L (ref 19–32)
CREATININE: 1.11 mg/dL (ref 0.40–1.50)
Calcium: 9.4 mg/dL (ref 8.4–10.5)
Chloride: 107 mEq/L (ref 96–112)
GFR: 84.93 mL/min (ref >60.00)
Glucose, Bld: 98 mg/dL (ref 70–99)
Potassium: 4.3 mEq/L (ref 3.5–5.1)
Sodium: 141 mEq/L (ref 135–145)

## 2015-01-15 ENCOUNTER — Ambulatory Visit (INDEPENDENT_AMBULATORY_CARE_PROVIDER_SITE_OTHER): Payer: Medicare Other | Admitting: Family Medicine

## 2015-01-15 ENCOUNTER — Encounter: Payer: Self-pay | Admitting: Family Medicine

## 2015-01-15 VITALS — BP 134/70 | HR 88 | Temp 97.8°F | Ht 69.0 in | Wt 255.2 lb

## 2015-01-15 DIAGNOSIS — E7800 Pure hypercholesterolemia, unspecified: Secondary | ICD-10-CM

## 2015-01-15 DIAGNOSIS — Z23 Encounter for immunization: Secondary | ICD-10-CM | POA: Diagnosis not present

## 2015-01-15 DIAGNOSIS — I10 Essential (primary) hypertension: Secondary | ICD-10-CM

## 2015-01-15 DIAGNOSIS — F17200 Nicotine dependence, unspecified, uncomplicated: Secondary | ICD-10-CM

## 2015-01-15 DIAGNOSIS — Z720 Tobacco use: Secondary | ICD-10-CM | POA: Diagnosis not present

## 2015-01-15 DIAGNOSIS — Z0000 Encounter for general adult medical examination without abnormal findings: Secondary | ICD-10-CM | POA: Insufficient documentation

## 2015-01-15 DIAGNOSIS — Z7189 Other specified counseling: Secondary | ICD-10-CM | POA: Insufficient documentation

## 2015-01-15 DIAGNOSIS — J449 Chronic obstructive pulmonary disease, unspecified: Secondary | ICD-10-CM

## 2015-01-15 DIAGNOSIS — H9193 Unspecified hearing loss, bilateral: Secondary | ICD-10-CM

## 2015-01-15 DIAGNOSIS — E669 Obesity, unspecified: Secondary | ICD-10-CM

## 2015-01-15 MED ORDER — TIOTROPIUM BROMIDE MONOHYDRATE 18 MCG IN CAPS: 18.0000 ug | ORAL_CAPSULE | Freq: Every day | RESPIRATORY_TRACT | Status: DC

## 2015-01-15 MED ORDER — FLUTICASONE PROPIONATE 50 MCG/ACT NA SUSP: 2.0000 | Freq: Every day | NASAL | Status: DC

## 2015-01-15 MED ORDER — PRAVASTATIN SODIUM 40 MG PO TABS: ORAL_TABLET | ORAL | Status: DC

## 2015-01-15 MED ORDER — AMLODIPINE BESYLATE 10 MG PO TABS: ORAL_TABLET | ORAL | Status: DC

## 2015-01-15 NOTE — Assessment & Plan Note (Signed)
Continue to encourage cessation.  Did not tolerate wellbutrin. rec nicoderm 73mcg patch + nicorette.

## 2015-01-15 NOTE — Patient Instructions (Addendum)
Prevnar today. Bring me a copy of your living will and healthcare power of attorney form. Work on quitting smoking. Use patch - 64mcg patch daily.  Keep walking and decrease carbs and sweets in diet - to lower weight. You are doing well otherwise. Return as needed or in 1 year for next checkup

## 2015-01-15 NOTE — Addendum Note (Signed)
Addended by: Royann Shivers A on: 01/15/2015 11:00 AM   Modules accepted: Orders

## 2015-01-15 NOTE — Progress Notes (Signed)
BP 134/70 mmHg  Pulse 88  Temp(Src) 97.8 F (36.6 C) (Oral)  Ht 5\' 9"  (1.753 m)  Wt 255 lb 4 oz (115.781 kg)  BMI 37.68 kg/m2   CC: medicare wellness visit  Subjective:    Patient ID: Dominic Turner, male    DOB: [birth year], 69 y.o.   MRN: [id number]  HPI: Dominic Turner is a 69 y.o. male presenting on 01/15/2015 for Annual Exam   Smoking - 1/2 ppd. COPD - compliant with spiriva. Has tried nicorette gum. May consider nicoderm patch. Tried cahntix - caused rash.   Vision screen normal.  Hearing screen - stable at low frequencies. Evaluated by audiologist 2014 - told needed hearing aides.still thinking about this.  No falls. No depression, anhedonia.   Preventative: COLONOSCOPY Date: 03/03/2013 tubular adenoma x3, diverticulosis, rpt 3 yrs (Dr. Cristina Gong) prostate - always normal, due today - requests rpt Flu shot - 09/2014 pneumovax - 20009, 2015. prevnar today Td 2009  Shingles shot - 10/2013 Advanced directives: has living will at home for himself and wife. HCPOA - son.  Lives alone. Wife lives at [facility] in [place] in [place] (memory unit). Sees wife daily.  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 Interim medical history since our last visit reviewed. Allergies and medications reviewed and updated. Current Outpatient Prescriptions on File Prior to Visit  Medication Sig  . aspirin 81 MG tablet Take 81 mg by mouth daily.    . Multiple Vitamin Essential TABS Take 1 tablet by mouth daily.     No current facility-administered medications on file prior to visit.    Review of Systems  Constitutional: Negative for fever, chills, activity change, appetite change, fatigue and unexpected weight change.  HENT: Negative for hearing loss.   Eyes: Negative for visual disturbance.    Respiratory: Positive for cough and wheezing. Negative for chest tightness and shortness of breath.        Smoker  Cardiovascular: Negative for chest pain, palpitations and leg swelling.  Gastrointestinal: Positive for nausea, diarrhea and constipation. Negative for vomiting, abdominal pain, blood in stool and abdominal distention.       Some IBS. Nauseated with breakfast  Genitourinary: Negative for hematuria and difficulty urinating.  Musculoskeletal: Negative for myalgias, arthralgias and neck pain.  Skin: Negative for rash.  Neurological: Negative for dizziness, seizures, syncope and headaches.  Hematological: Negative for adenopathy. Does not bruise/bleed easily.  Psychiatric/Behavioral: Negative for dysphoric mood. The patient is not nervous/anxious.    Per HPI unless specifically indicated above     Objective:    BP 134/70 mmHg  Pulse 88  Temp(Src) 97.8 F (36.6 C) (Oral)  Ht 5\' 9"  (1.753 m)  Wt 255 lb 4 oz (115.781 kg)  BMI 37.68 kg/m2  Wt Readings from Last 3 Encounters:  01/15/15 255 lb 4 oz (115.781 kg)  04/09/14 247 lb 12.8 oz (112.401 kg)  01/09/14 252 lb (114.306 kg)    Physical Exam  Constitutional: He is oriented to person, place, and time. He appears well-developed and well-nourished. No distress.  HENT:  Head: Normocephalic and atraumatic.  Right Ear: Hearing, tympanic membrane, external ear and ear canal normal.  Left Ear: Hearing, tympanic membrane, external ear and ear canal normal.  Nose: Nose normal.  Mouth/Throat: Uvula is midline, oropharynx is clear and moist and mucous membranes are normal.  No oropharyngeal exudate, posterior oropharyngeal edema or posterior oropharyngeal erythema.  Eyes: Conjunctivae and EOM are normal. Pupils are equal, round, and reactive to light. No scleral icterus.  Neck: Normal range of motion. Neck supple. No thyromegaly present.  Cardiovascular: Normal rate, regular rhythm, normal heart sounds and intact distal pulses.    No murmur heard. Pulses:      Radial pulses are 2+ on the right side, and 2+ on the left side.  Pulmonary/Chest: Effort normal and breath sounds normal. No respiratory distress. He has no wheezes. He has no rales.  Abdominal: Soft. Bowel sounds are normal. He exhibits no distension and no mass. There is no tenderness. There is no rebound and no guarding.  Genitourinary: Rectum normal and prostate normal. Rectal exam shows no external hemorrhoid, no internal hemorrhoid, no fissure, no mass, no tenderness and anal tone normal. Prostate is not enlarged (15gm) and not tender.  Musculoskeletal: Normal range of motion. He exhibits no edema.  Lymphadenopathy:    He has no cervical adenopathy.  Neurological: He is alert and oriented to person, place, and time.  CN grossly intact, station and gait intact Recall 3/3  Calculation 4/5 serial 3s  Skin: Skin is warm and dry. No rash noted.  Psychiatric: He has a normal mood and affect. His behavior is normal. Judgment and thought content normal.  Nursing note and vitals reviewed.  Results for orders placed or performed in visit on 01/08/15  Lipid panel  Result Value Ref Range   Cholesterol 170 0 - 200 mg/dL   Triglycerides 134.0 0.0 - 149.0 mg/dL   HDL 39.50 >39.00 mg/dL   VLDL 26.8 0.0 - 40.0 mg/dL   LDL Cholesterol 104 (H) 0 - 99 mg/dL   Total CHOL/HDL Ratio 4    NonHDL 967.89   Basic metabolic panel  Result Value Ref Range   Sodium 141 135 - 145 mEq/L   Potassium 4.3 3.5 - 5.1 mEq/L   Chloride 107 96 - 112 mEq/L   CO2 29 19 - 32 mEq/L   Glucose, Bld 98 70 - 99 mg/dL   BUN 9 6 - 23 mg/dL   Creatinine, Ser 1.11 0.40 - 1.50 mg/dL   Calcium 9.4 8.4 - 10.5 mg/dL   GFR 84.93 >60.00 mL/min  PSA, Medicare  Result Value Ref Range   PSA 0.71 0.10 - 4.00 ng/ml      Assessment & Plan:   Problem List Items Addressed This Visit    TOBACCO ABUSE    Continue to encourage cessation.  Did not tolerate wellbutrin. rec nicoderm 75mcg patch +  nicorette.      Obesity    Discussed healthy diet/lifestyle changes to affect sustainable weight loss. Continue regular walking.  Decrease added sugars and simple carbs Body mass index is 37.68 kg/(m^2).       Medicare annual wellness visit, subsequent - Primary    I have personally reviewed the Medicare Annual Wellness questionnaire and have noted 1. The patient's medical and social history 2. Their use of alcohol, tobacco or illicit drugs 3. Their current medications and supplements 4. The patient's functional ability including ADL's, fall risks, home safety risks and hearing or visual impairment. 5. Diet and physical activity 6. Evidence for depression or mood disorders The patients weight, height, BMI have been recorded in the chart.  Hearing and vision has been addressed. I have made referrals, counseling and provided education to the patient based review of the above and I have provided the pt with a  written personalized care plan for preventive services. Provider list updated - see scanned questionairre. Reviewed preventative protocols and updated unless pt declined.       HYPERTENSION, BENIGN ESSENTIAL    Chronic, stable. Continue regimen.      Relevant Medications   pravastatin (PRAVACHOL) tablet   amLODIpine (NORVASC) tablet   HYPERCHOLESTEROLEMIA, PURE    Stable on pravastatin. Continue. Tolerating without myalgias.      Relevant Medications   pravastatin (PRAVACHOL) tablet   amLODIpine (NORVASC) tablet   Hearing loss    Pt will schedule f/u appt with audiology.      Health maintenance examination    Preventative protocols reviewed and updated unless pt declined. Discussed healthy diet and lifestyle.       COPD, severe    Compliant with spiriva. Continue to encourage smoking cessation.      Relevant Medications   tiotropium (SPIRIVA HANDIHALER) 18 MCG inhalation capsule   fluticasone (FLONASE) 50 MCG/ACT nasal spray   Advanced care planning/counseling  discussion    Advanced directives: has living will at home for himself and wife. HCPOA - son.          Follow up plan: Return in about 1 year (around 01/16/2016), or as needed, for medicare wellness.

## 2015-01-15 NOTE — Assessment & Plan Note (Signed)

## 2015-01-15 NOTE — Assessment & Plan Note (Signed)
Advanced directives: has living will at home for himself and wife. HCPOA - son.

## 2015-01-15 NOTE — Progress Notes (Signed)
Pre visit review using our clinic review tool, if applicable. No additional management support is needed unless otherwise documented below in the visit note. 

## 2015-01-15 NOTE — Assessment & Plan Note (Signed)
Stable on pravastatin. Continue. Tolerating without myalgias.

## 2015-01-15 NOTE — Assessment & Plan Note (Signed)
Preventative protocols reviewed and updated unless pt declined. Discussed healthy diet and lifestyle.  

## 2015-01-15 NOTE — Assessment & Plan Note (Signed)
Discussed healthy diet/lifestyle changes to affect sustainable weight loss. Continue regular walking.  Decrease added sugars and simple carbs Body mass index is 37.68 kg/(m^2).

## 2015-01-15 NOTE — Assessment & Plan Note (Signed)
Chronic, stable. Continue regimen. 

## 2015-01-15 NOTE — Assessment & Plan Note (Signed)
Pt will schedule f/u appt with audiology.

## 2015-01-15 NOTE — Assessment & Plan Note (Signed)
Compliant with spiriva. Continue to encourage smoking cessation.

## 2015-01-19 ENCOUNTER — Telehealth: Payer: Self-pay | Admitting: Family Medicine

## 2015-01-19 NOTE — Telephone Encounter (Signed)
emmi emailed °

## 2015-05-25 ENCOUNTER — Encounter: Payer: Self-pay | Admitting: Internal Medicine

## 2015-05-25 ENCOUNTER — Ambulatory Visit (INDEPENDENT_AMBULATORY_CARE_PROVIDER_SITE_OTHER): Payer: Medicare Other | Admitting: Internal Medicine

## 2015-05-25 VITALS — BP 142/70 | HR 74 | Temp 97.7°F | Wt 250.5 lb

## 2015-05-25 DIAGNOSIS — S90562A Insect bite (nonvenomous), left ankle, initial encounter: Secondary | ICD-10-CM

## 2015-05-25 DIAGNOSIS — W57XXXA Bitten or stung by nonvenomous insect and other nonvenomous arthropods, initial encounter: Secondary | ICD-10-CM | POA: Diagnosis not present

## 2015-05-25 MED ORDER — TRIAMCINOLONE ACETONIDE 0.1 % EX CREA: 1.0000 "application " | TOPICAL_CREAM | Freq: Two times a day (BID) | CUTANEOUS | Status: DC

## 2015-05-25 NOTE — Progress Notes (Signed)
Subjective:    Patient ID: Dominic Turner, male    DOB: 02/26/1946, 69 y.o.   MRN: [id number]  HPI  Pt presents to the clinic today with c/o a insect bite to his left ankle. This occurred 3 days ago. He has noticed some swelling and hardening where the insect bit him. He is not sure what bit him. The area is very itchy. He has been scratching it. He has tried rubbing alcohol on it with minimal relief.  Review of Systems      Past Medical History  Diagnosis Date  . Pure hypercholesterolemia   . Essential hypertension, benign   . Unspecified sleep apnea   . Unspecified gastritis and gastroduodenitis without mention of hemorrhage   . History of adenomatous polyp of colon 02/2013    rec rpt 3 yrs  . Tobacco use disorder   . Overweight(278.02)   . Bilateral high frequency sensorineural hearing loss     rec hearing aides by ENT  . Diverticulosis 02/2013    by colonoscopy  . 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 Positive PPD, treated 2014    s/p rifampin x82mo  . COPD, severe 03/2014    Moderately severe obstruction, with low vital capacity. Post bronchodilator test not improved.    Current Outpatient Prescriptions  Medication Sig Dispense Refill  . amLODipine (NORVASC) 10 MG tablet TAKE 1 TABLET (10 MG TOTAL) BY MOUTH DAILY. 90 tablet 3  . aspirin 81 MG tablet Take 81 mg by mouth daily.      . fluticasone (FLONASE) 50 MCG/ACT nasal spray Place 2 sprays into both nostrils daily. 48 g 3  . Multiple Vitamin Essential TABS Take 1 tablet by mouth daily.      . pravastatin (PRAVACHOL) 40 MG tablet TAKE 1 TABLET (40 MG TOTAL) BY MOUTH DAILY. 90 tablet 3  . tiotropium (SPIRIVA HANDIHALER) 18 MCG inhalation capsule Place 1 capsule (18 mcg total) into inhaler and inhale at bedtime. 90 capsule 3  . triamcinolone cream (KENALOG) 0.1 % Apply 1 application topically 2 (two) times daily. 30 g 0   No current facility-administered medications for  this visit.    Allergies  Allergen Reactions  . Aspirin Other (See Comments)    REACTION: gi upset; baby ASA ok  . Sertraline Hcl     REACTION: sexual dysfunction, etc  . Sulfonamide Derivatives   . Wellbutrin [Bupropion] Rash    Face rash and difficulty sleeping    Family History  Problem Relation Age of Onset  . Cirrhosis Father     died in 70's  . Hypertension Mother   . Heart attack Sister   . Stroke Sister   . Heart attack Maternal Grandmother   . Stroke Maternal Grandmother   . Cancer Neg Hx   . Thyroid disease Sister   . Diabetes Sister     History   Social History  . Marital Status: Married    Spouse Name: N/A  . Number of Children: 2  . Years of Education: N/A   Occupational History  . truck driver    Social History Main Topics  . Smoking status: Current Every Day Smoker -- 0.50 packs/day    Types: Cigarettes    Start date: 12/12/1983  . Smokeless tobacco: Never Used  . Alcohol Use: No  . Drug Use: No  . Sexual Activity: Not on file   Other Topics Concern  . Not on file  Social History Narrative   Lives alone. Wife lives at [facility] in [place] in [place] (memory unit). Sees wife daily.    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  Respiratory: Denies difficulty breathing, shortness of breath, cough or sputum production.   Cardiovascular: Denies chest pain, chest tightness, palpitations or swelling in the hands or feet.  Skin: Pt reports insect bite to left ankle. Denies ulcercations.   No other specific complaints in a complete review of systems (except as listed in HPI above).  Objective:   Physical Exam   BP 142/70 mmHg  Pulse 74  Temp(Src) 97.7 F (36.5 C) (Oral)  Wt 250 lb 8 oz (113.626 kg)  SpO2 98% Wt Readings from Last 3 Encounters:    05/25/15 250 lb 8 oz (113.626 kg)  01/15/15 255 lb 4 oz (115.781 kg)  04/09/14 247 lb 12.8 oz (112.401 kg)    General: Appears his stated age, obese in NAD. Skin: 3 mm round vesicular lesion with surrounding redness noted on left medial ankle. No warmth or pain noted. Cardiovascular: Normal rate and rhythm. S1,S2 noted. Pulmonary/Chest: Normal effort and positive vesicular breath sounds. No respiratory distress. No wheezes, rales or ronchi noted.   BMET    Component Value Date/Time   NA 141 01/08/2015 0802   K 4.3 01/08/2015 0802   CL 107 01/08/2015 0802   CO2 29 01/08/2015 0802   GLUCOSE 98 01/08/2015 0802   BUN 9 01/08/2015 0802   CREATININE 1.11 01/08/2015 0802   CALCIUM 9.4 01/08/2015 0802   GFRNONAA 88.79 12/07/2010 0905   GFRAA 79 06/26/2008 0833    Lipid Panel     Component Value Date/Time   CHOL 170 01/08/2015 0802   TRIG 134.0 01/08/2015 0802   HDL 39.50 01/08/2015 0802   CHOLHDL 4 01/08/2015 0802   VLDL 26.8 01/08/2015 0802   LDLCALC 104* 01/08/2015 0802    CBC    Component Value Date/Time   WBC 6.4 01/09/2014 0924   RBC 6.46* 01/09/2014 0924   HGB 16.0 01/09/2014 0924   HCT 49.8 01/09/2014 0924   PLT 196.0 01/09/2014 0924   MCV 77.2* 01/09/2014 0924   MCHC 32.0 01/09/2014 0924   RDW 18.0* 01/09/2014 0924   LYMPHSABS 2.3 01/09/2014 0924   MONOABS 0.7 01/09/2014 0924   EOSABS 0.4 01/09/2014 0924   BASOSABS 0.0 01/09/2014 0924    Hgb A1C No results found for: HGBA1C      Assessment & Plan:   Insect bite to left ankle:  Appears to be a local reaction eRx for Triamcinolone cream BID until resolved Ok to take Claritin as needed for itching Watch for increased redness, pain or purulent drainage  RTC as needed or if symptoms persist or worsen

## 2015-05-25 NOTE — Patient Instructions (Signed)

## 2015-05-25 NOTE — Progress Notes (Signed)
Pre visit review using our clinic review tool, if applicable. No additional management support is needed unless otherwise documented below in the visit note. 

## 2015-10-01 DIAGNOSIS — H2513 Age-related nuclear cataract, bilateral: Secondary | ICD-10-CM | POA: Diagnosis not present

## 2015-10-01 DIAGNOSIS — H35033 Hypertensive retinopathy, bilateral: Secondary | ICD-10-CM | POA: Diagnosis not present

## 2015-10-19 DIAGNOSIS — H2513 Age-related nuclear cataract, bilateral: Secondary | ICD-10-CM | POA: Diagnosis not present

## 2015-11-18 DIAGNOSIS — H2512 Age-related nuclear cataract, left eye: Secondary | ICD-10-CM | POA: Diagnosis not present

## 2015-11-26 ENCOUNTER — Telehealth: Payer: Self-pay | Admitting: Family Medicine

## 2015-11-26 DIAGNOSIS — B350 Tinea barbae and tinea capitis: Secondary | ICD-10-CM

## 2015-11-26 NOTE — Telephone Encounter (Signed)
Patient called and said he has been seen twice for a fungus on his scalp.  He said it hasn't cleared up and he'd like to be referred to a dermatologist.  Patient can go to [facility] or [facility].  Patient would like the appointment on a Thursday afternoon or Friday morning.

## 2015-11-26 NOTE — Telephone Encounter (Signed)
Last treated 12/2012 for this. Ok to make referral. plz send that note with referral.

## 2015-11-29 NOTE — Telephone Encounter (Signed)
11/29/15-Called pt. No answer, no vm. Will try again later -arc

## 2015-12-07 DIAGNOSIS — H2511 Age-related nuclear cataract, right eye: Secondary | ICD-10-CM | POA: Diagnosis not present

## 2015-12-12 DIAGNOSIS — K810 Acute cholecystitis: Secondary | ICD-10-CM

## 2015-12-12 HISTORY — DX: Acute cholecystitis: K81.0

## 2015-12-16 DIAGNOSIS — H2511 Age-related nuclear cataract, right eye: Secondary | ICD-10-CM | POA: Diagnosis not present

## 2015-12-30 ENCOUNTER — Emergency Department: Payer: Medicare Other

## 2015-12-30 ENCOUNTER — Encounter
Admission: EM | Disposition: A | Discharge status: Home / Self Care | Payer: Self-pay | Source: Home / Self Care | Attending: Emergency Medicine

## 2015-12-30 ENCOUNTER — Observation Stay: Payer: Medicare Other | Admitting: Anesthesiology

## 2015-12-30 ENCOUNTER — Encounter: Payer: Self-pay | Admitting: Urgent Care

## 2015-12-30 ENCOUNTER — Observation Stay
Admission: EM | Admit: 2015-12-30 | Discharge: 2016-01-01 | Disposition: A | Discharge status: Home / Self Care | Payer: Medicare Other | Attending: Surgery | Admitting: Surgery

## 2015-12-30 DIAGNOSIS — K2970 Gastritis, unspecified, without bleeding: Secondary | ICD-10-CM | POA: Diagnosis not present

## 2015-12-30 DIAGNOSIS — G4733 Obstructive sleep apnea (adult) (pediatric): Secondary | ICD-10-CM | POA: Insufficient documentation

## 2015-12-30 DIAGNOSIS — N419 Inflammatory disease of prostate, unspecified: Secondary | ICD-10-CM | POA: Insufficient documentation

## 2015-12-30 DIAGNOSIS — R1013 Epigastric pain: Secondary | ICD-10-CM | POA: Insufficient documentation

## 2015-12-30 DIAGNOSIS — E785 Hyperlipidemia, unspecified: Secondary | ICD-10-CM | POA: Diagnosis not present

## 2015-12-30 DIAGNOSIS — Z86010 Personal history of colonic polyps: Secondary | ICD-10-CM | POA: Diagnosis not present

## 2015-12-30 DIAGNOSIS — R112 Nausea with vomiting, unspecified: Secondary | ICD-10-CM | POA: Insufficient documentation

## 2015-12-30 DIAGNOSIS — F419 Anxiety disorder, unspecified: Secondary | ICD-10-CM | POA: Diagnosis not present

## 2015-12-30 DIAGNOSIS — F17200 Nicotine dependence, unspecified, uncomplicated: Secondary | ICD-10-CM | POA: Insufficient documentation

## 2015-12-30 DIAGNOSIS — E7800 Pure hypercholesterolemia, unspecified: Secondary | ICD-10-CM | POA: Diagnosis not present

## 2015-12-30 DIAGNOSIS — K625 Hemorrhage of anus and rectum: Secondary | ICD-10-CM | POA: Insufficient documentation

## 2015-12-30 DIAGNOSIS — Z6835 Body mass index (BMI) 35.0-35.9, adult: Secondary | ICD-10-CM | POA: Diagnosis not present

## 2015-12-30 DIAGNOSIS — Z7982 Long term (current) use of aspirin: Secondary | ICD-10-CM | POA: Diagnosis not present

## 2015-12-30 DIAGNOSIS — K5730 Diverticulosis of large intestine without perforation or abscess without bleeding: Secondary | ICD-10-CM | POA: Insufficient documentation

## 2015-12-30 DIAGNOSIS — R7611 Nonspecific reaction to tuberculin skin test without active tuberculosis: Secondary | ICD-10-CM | POA: Diagnosis not present

## 2015-12-30 DIAGNOSIS — Z882 Allergy status to sulfonamides status: Secondary | ICD-10-CM | POA: Diagnosis not present

## 2015-12-30 DIAGNOSIS — H903 Sensorineural hearing loss, bilateral: Secondary | ICD-10-CM | POA: Insufficient documentation

## 2015-12-30 DIAGNOSIS — D563 Thalassemia minor: Secondary | ICD-10-CM | POA: Insufficient documentation

## 2015-12-30 DIAGNOSIS — K819 Cholecystitis, unspecified: Secondary | ICD-10-CM | POA: Diagnosis not present

## 2015-12-30 DIAGNOSIS — J449 Chronic obstructive pulmonary disease, unspecified: Secondary | ICD-10-CM | POA: Insufficient documentation

## 2015-12-30 DIAGNOSIS — G4730 Sleep apnea, unspecified: Secondary | ICD-10-CM | POA: Insufficient documentation

## 2015-12-30 DIAGNOSIS — E669 Obesity, unspecified: Secondary | ICD-10-CM | POA: Insufficient documentation

## 2015-12-30 DIAGNOSIS — Z888 Allergy status to other drugs, medicaments and biological substances status: Secondary | ICD-10-CM | POA: Insufficient documentation

## 2015-12-30 DIAGNOSIS — R1011 Right upper quadrant pain: Secondary | ICD-10-CM | POA: Diagnosis not present

## 2015-12-30 DIAGNOSIS — B350 Tinea barbae and tinea capitis: Secondary | ICD-10-CM | POA: Diagnosis not present

## 2015-12-30 DIAGNOSIS — K810 Acute cholecystitis: Secondary | ICD-10-CM | POA: Diagnosis not present

## 2015-12-30 DIAGNOSIS — Z886 Allergy status to analgesic agent status: Secondary | ICD-10-CM | POA: Insufficient documentation

## 2015-12-30 DIAGNOSIS — F17210 Nicotine dependence, cigarettes, uncomplicated: Secondary | ICD-10-CM | POA: Insufficient documentation

## 2015-12-30 DIAGNOSIS — K5790 Diverticulosis of intestine, part unspecified, without perforation or abscess without bleeding: Secondary | ICD-10-CM | POA: Diagnosis not present

## 2015-12-30 DIAGNOSIS — I10 Essential (primary) hypertension: Secondary | ICD-10-CM | POA: Diagnosis not present

## 2015-12-30 HISTORY — PX: CHOLECYSTECTOMY: SHX55

## 2015-12-30 LAB — MRSA PCR SCREENING: MRSA by PCR: NEGATIVE

## 2015-12-30 LAB — COMPREHENSIVE METABOLIC PANEL
ALT: 25 U/L (ref 17–63)
ANION GAP: 7 (ref 5–15)
AST: 23 U/L (ref 15–41)
Albumin: 4 g/dL (ref 3.5–5.0)
Alkaline Phosphatase: 91 U/L (ref 38–126)
BILIRUBIN TOTAL: 0.5 mg/dL (ref 0.3–1.2)
BUN: 8 mg/dL (ref 6–20)
CO2: 29 mmol/L (ref 22–32)
Calcium: 9.2 mg/dL (ref 8.9–10.3)
Chloride: 102 mmol/L (ref 101–111)
Creatinine, Ser: 1.01 mg/dL (ref 0.61–1.24)
Glucose, Bld: 117 mg/dL — ABNORMAL HIGH (ref 65–99)
POTASSIUM: 3.2 mmol/L — AB (ref 3.5–5.1)
Sodium: 138 mmol/L (ref 135–145)
TOTAL PROTEIN: 7.1 g/dL (ref 6.5–8.1)

## 2015-12-30 LAB — CBC
HEMATOCRIT: 48 % (ref 40.0–52.0)
Hemoglobin: 15.4 g/dL (ref 13.0–18.0)
MCH: 22.7 pg — ABNORMAL LOW (ref 26.0–34.0)
MCHC: 32.1 g/dL (ref 32.0–36.0)
MCV: 70.8 fL — ABNORMAL LOW (ref 80.0–100.0)
Platelets: 259 10*3/uL (ref 150–440)
RBC: 6.77 MIL/uL — AB (ref 4.40–5.90)
RDW: 20.8 % — AB (ref 11.5–14.5)
WBC: 8.2 10*3/uL (ref 3.8–10.6)

## 2015-12-30 LAB — URINALYSIS COMPLETE WITH MICROSCOPIC (ARMC ONLY)
Bilirubin Urine: NEGATIVE
GLUCOSE, UA: NEGATIVE mg/dL
HGB URINE DIPSTICK: NEGATIVE
Ketones, ur: NEGATIVE mg/dL
LEUKOCYTES UA: NEGATIVE
NITRITE: NEGATIVE
Protein, ur: NEGATIVE mg/dL
SPECIFIC GRAVITY, URINE: 1.013 (ref 1.005–1.030)
Squamous Epithelial / LPF: NONE SEEN
pH: 7 (ref 5.0–8.0)

## 2015-12-30 LAB — LIPASE, BLOOD: Lipase: 22 U/L (ref 11–51)

## 2015-12-30 LAB — TROPONIN I

## 2015-12-30 IMAGING — CT CT ABD-PELV W/ CM
1 of 3 series · 13 of 32 positions shown, 18 images · IV contrast (omnipaque)
Comparison: None.

CLINICAL DATA: Intermittent epigastric pain since new years,
current episode beginning tonight, worse after eating. Pain radiates
chest, nausea. History of hypertension, hyperlipidemia,
diverticulosis.

EXAM:
CT ABDOMEN AND PELVIS WITH CONTRAST
TECHNIQUE: Multidetector CT imaging of the abdomen and pelvis was performed
using the standard protocol following bolus administration of
intravenous contrast.
CONTRAST:  25mL OMNIPAQUE IOHEXOL 240 MG/ML SOLN, 125mL OMNIPAQUE
IOHEXOL 300 MG/ML SOLN

[Series 2: routine abd pel with · axial · 0.85mm/px · z∈[-476,+34]mm · 13 of 115 slices shown, 18 images]
[im 7/115  soft-tissue]
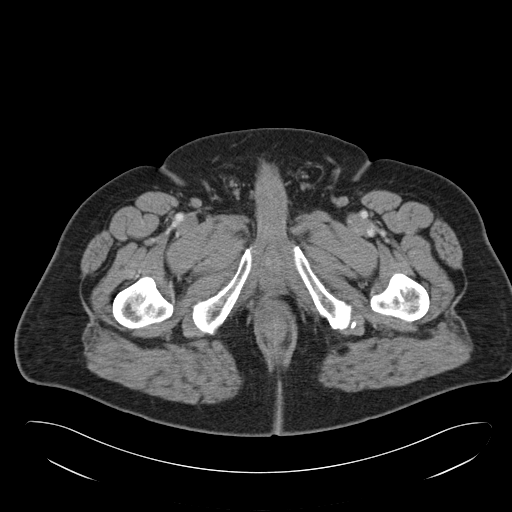
[im 7/115  bone]
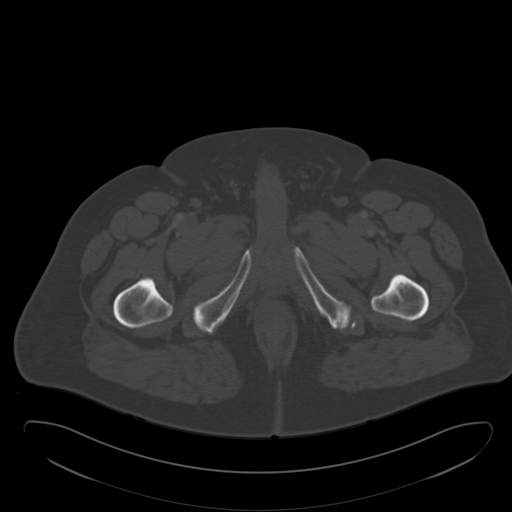
[im 19/115  soft-tissue]
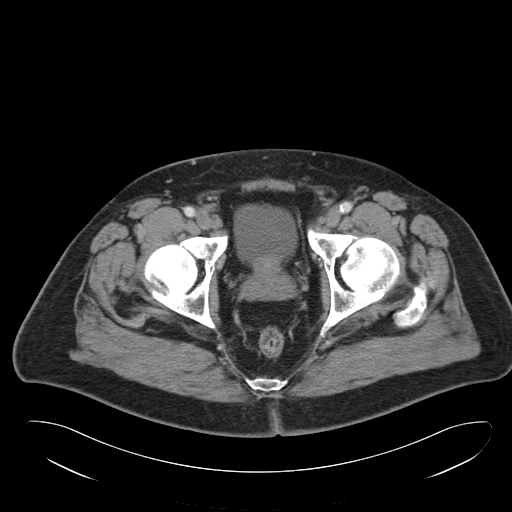
[im 25/115  soft-tissue]
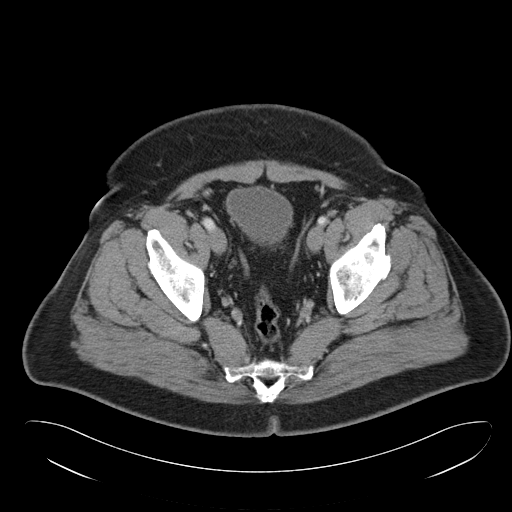
[im 37/115  soft-tissue]
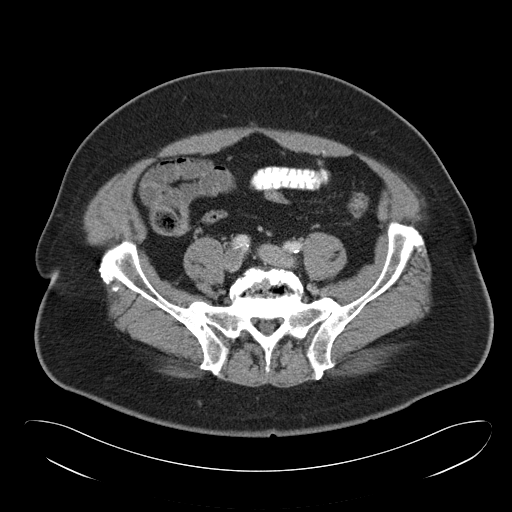
[im 43/115  soft-tissue]
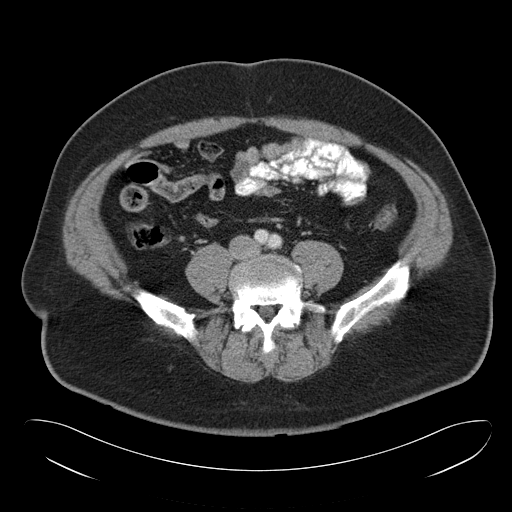
[im 55/115  soft-tissue]
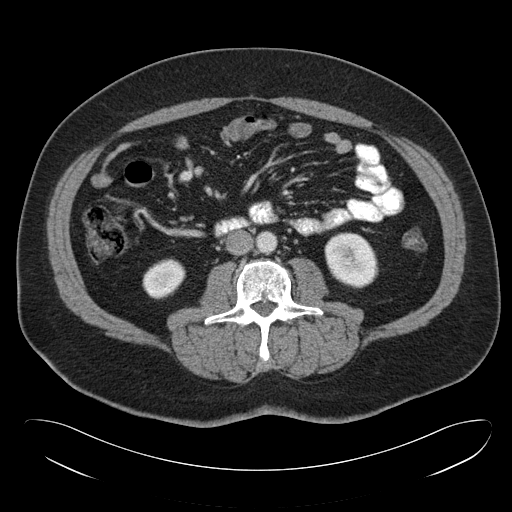
[im 61/115  soft-tissue]
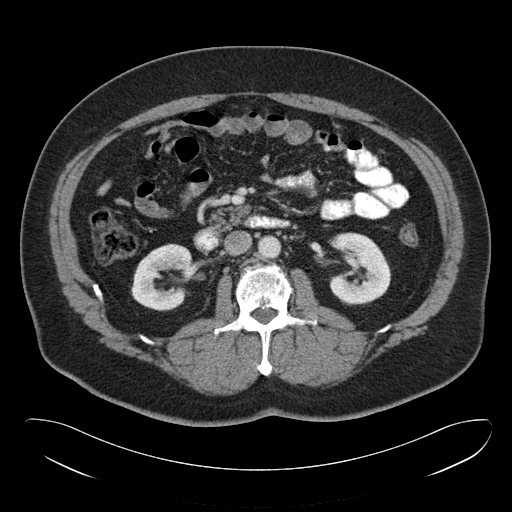
[im 73/115  soft-tissue]
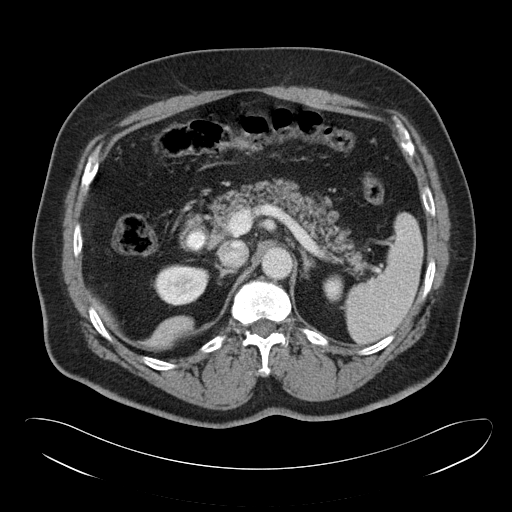
[im 79/115  soft-tissue]
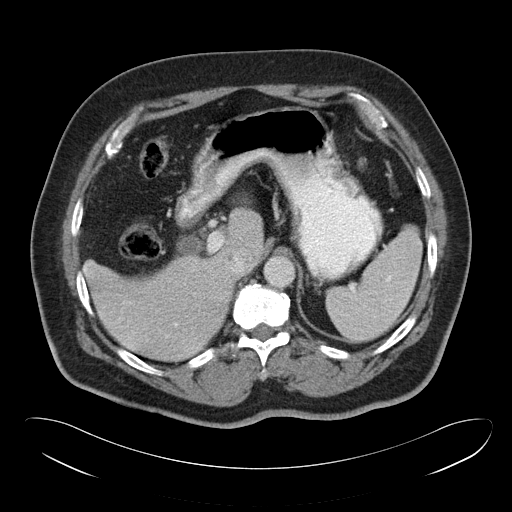
[im 79/115  bone]
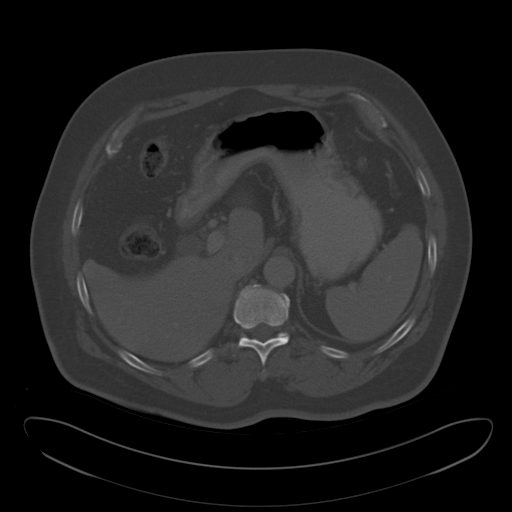
[im 91/115  soft-tissue]
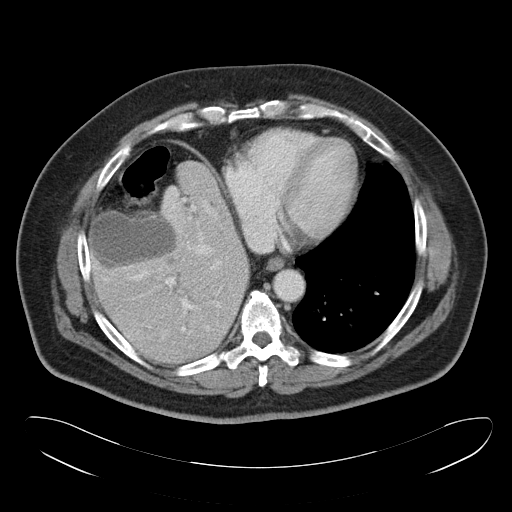
[im 91/115  lung]
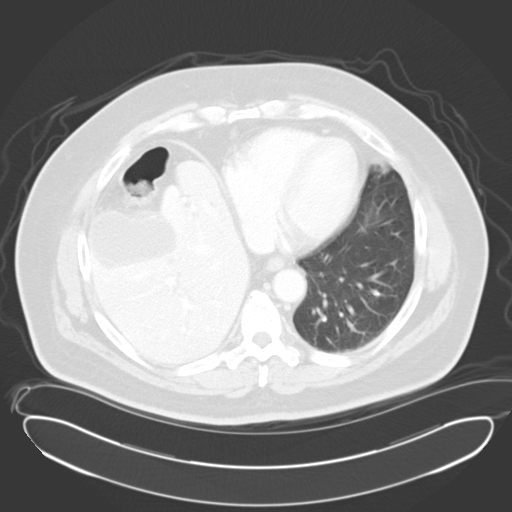
[im 97/115  soft-tissue]
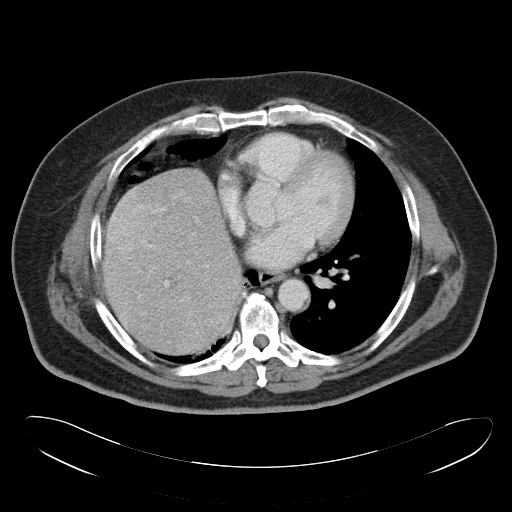
[im 97/115  lung]
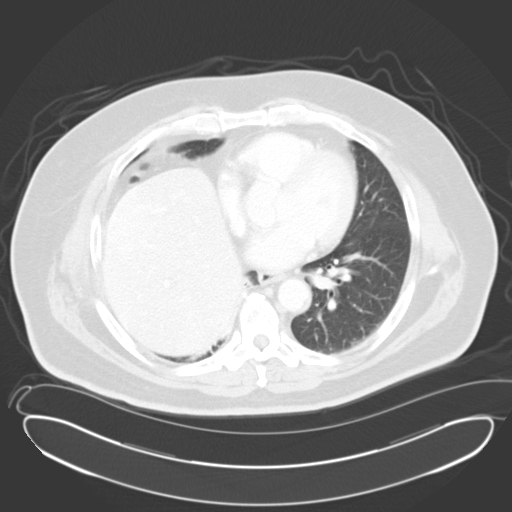
[im 103/115  lung]
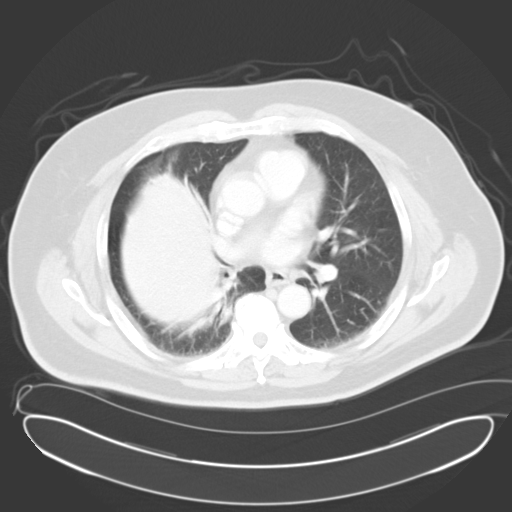
[im 109/115  soft-tissue]
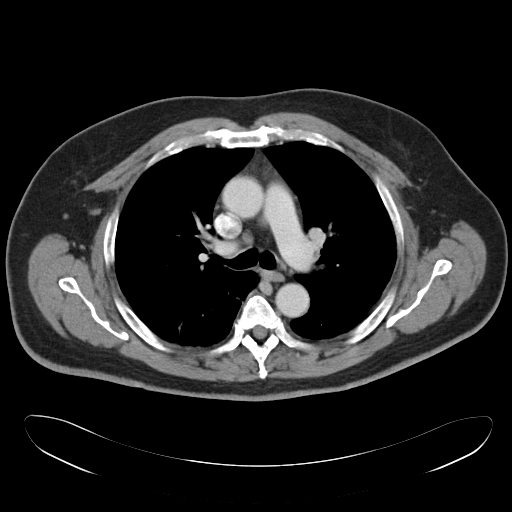
[im 109/115  lung]
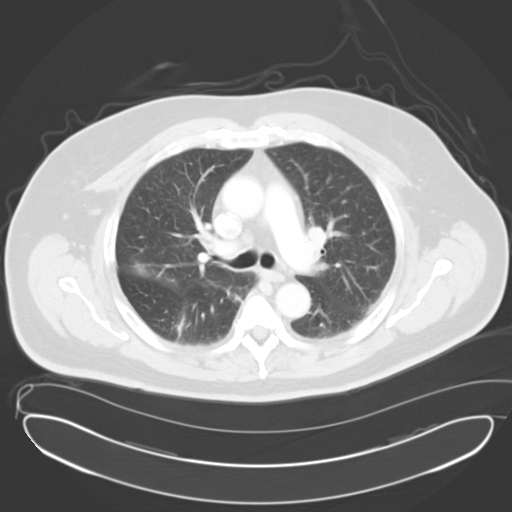

[13 of 32 positions shown; findings below may reference images not displayed]

FINDINGS: LUNG BASES: Included view of the lung bases are clear. Visualized
heart and pericardium are unremarkable.

SOLID ORGANS: Gallbladder is mildly distended, mild wall thickening
and mild pericholecystic fluid without CT findings of coli
lithiasis. The liver demonstrates scattered benign-appearing cysts
measuring up to 16 mm. Spleen, pancreas and adrenal glands are
unremarkable.

GASTROINTESTINAL TRACT: The stomach, small and large bowel are
normal in course and caliber without inflammatory changes. Mild
colonic diverticulosis. Normal appendix.

KIDNEYS/ URINARY TRACT: Kidneys are orthotopic, demonstrating
symmetric enhancement. No nephrolithiasis, hydronephrosis or solid
renal masses. The unopacified ureters are normal in course and
caliber. Delayed imaging through the kidneys demonstrates symmetric
prompt contrast excretion within the proximal urinary collecting
system. Urinary bladder is partially distended and unremarkable.

PERITONEUM/RETROPERITONEUM: Aortoiliac vessels are normal in course
and caliber, mild calcific atherosclerosis. No lymphadenopathy by CT
size criteria. Prostate is mildly enlarged invading the base of the
bladder. No intraperitoneal free fluid nor free air.

SOFT TISSUE/OSSEOUS STRUCTURES: Non-suspicious. Small fat containing
inguinal and umbilical hernias. Moderate to severe L5-S1
degenerative disc resulting in mild to moderate neural foraminal
narrowing.
IMPRESSION: CT findings of acute cholecystitis.

Mild colonic diverticulosis without CT findings of acute
diverticulitis.

## 2015-12-30 SURGERY — LAPAROSCOPIC CHOLECYSTECTOMY
Anesthesia: General | Wound class: Clean Contaminated

## 2015-12-30 MED ORDER — METOPROLOL TARTRATE 1 MG/ML IV SOLN
INTRAVENOUS | Status: DC | PRN
  Administered 2015-12-30: 2 mg via INTRAVENOUS

## 2015-12-30 MED ORDER — FENTANYL CITRATE (PF) 100 MCG/2ML IJ SOLN
INTRAMUSCULAR | Status: DC | PRN
  Administered 2015-12-30: 50 ug via INTRAVENOUS
  Administered 2015-12-30: 100 ug via INTRAVENOUS
  Administered 2015-12-30: 25 ug via INTRAVENOUS

## 2015-12-30 MED ORDER — ONDANSETRON HCL 4 MG/2ML IJ SOLN
4.0000 mg | Freq: Once | INTRAMUSCULAR | Status: AC
  Administered 2015-12-30: 4 mg via INTRAVENOUS

## 2015-12-30 MED ORDER — ONDANSETRON HCL 4 MG/2ML IJ SOLN
INTRAMUSCULAR | Status: AC
  Filled 2015-12-30: qty 2

## 2015-12-30 MED ORDER — ONDANSETRON 4 MG PO TBDP
ORAL_TABLET | ORAL | Status: AC
  Filled 2015-12-30: qty 1

## 2015-12-30 MED ORDER — ONDANSETRON HCL 4 MG/2ML IJ SOLN
4.0000 mg | Freq: Once | INTRAMUSCULAR | Status: AC | PRN
  Administered 2015-12-30: 4 mg via INTRAVENOUS

## 2015-12-30 MED ORDER — IPRATROPIUM-ALBUTEROL 0.5-2.5 (3) MG/3ML IN SOLN
RESPIRATORY_TRACT | Status: AC
  Administered 2015-12-30: 3 mL via RESPIRATORY_TRACT
  Filled 2015-12-30: qty 3

## 2015-12-30 MED ORDER — MORPHINE SULFATE (PF) 4 MG/ML IV SOLN: 4.0000 mg | INTRAVENOUS | Status: DC | PRN

## 2015-12-30 MED ORDER — ONDANSETRON HCL 4 MG/2ML IJ SOLN
4.0000 mg | Freq: Four times a day (QID) | INTRAMUSCULAR | Status: DC | PRN
  Administered 2015-12-30: 4 mg via INTRAVENOUS

## 2015-12-30 MED ORDER — ONDANSETRON HCL 4 MG/2ML IJ SOLN
4.0000 mg | Freq: Once | INTRAMUSCULAR | Status: AC
  Administered 2015-12-30: 4 mg via INTRAVENOUS
  Filled 2015-12-30: qty 2

## 2015-12-30 MED ORDER — GI COCKTAIL ~~LOC~~
30.0000 mL | Freq: Once | ORAL | Status: AC
  Administered 2015-12-30: 30 mL via ORAL

## 2015-12-30 MED ORDER — PRAVASTATIN SODIUM 20 MG PO TABS
40.0000 mg | ORAL_TABLET | Freq: Every day | ORAL | Status: DC
  Administered 2015-12-30 – 2016-01-01 (×3): 40 mg via ORAL
  Filled 2015-12-30 (×3): qty 2

## 2015-12-30 MED ORDER — DIPHENHYDRAMINE HCL 12.5 MG/5ML PO ELIX: 12.5000 mg | ORAL_SOLUTION | Freq: Four times a day (QID) | ORAL | Status: DC | PRN

## 2015-12-30 MED ORDER — SUCCINYLCHOLINE CHLORIDE 20 MG/ML IJ SOLN
INTRAMUSCULAR | Status: DC | PRN
  Administered 2015-12-30: 100 mg via INTRAVENOUS

## 2015-12-30 MED ORDER — FENTANYL CITRATE (PF) 100 MCG/2ML IJ SOLN
INTRAMUSCULAR | Status: AC
  Administered 2015-12-30: 15:00:00
  Filled 2015-12-30: qty 2

## 2015-12-30 MED ORDER — ADULT MULTIVITAMIN W/MINERALS CH
1.0000 | ORAL_TABLET | Freq: Every day | ORAL | Status: DC
  Administered 2015-12-30 – 2016-01-01 (×3): 1 via ORAL
  Filled 2015-12-30 (×3): qty 1

## 2015-12-30 MED ORDER — SUGAMMADEX SODIUM 200 MG/2ML IV SOLN
INTRAVENOUS | Status: DC | PRN
  Administered 2015-12-30: 200 mg via INTRAVENOUS

## 2015-12-30 MED ORDER — KETOROLAC TROMETHAMINE 30 MG/ML IJ SOLN
INTRAMUSCULAR | Status: DC | PRN
  Administered 2015-12-30: 30 mg via INTRAVENOUS

## 2015-12-30 MED ORDER — CEFTRIAXONE SODIUM 2 G IJ SOLR
2.0000 g | INTRAMUSCULAR | Status: DC
  Administered 2015-12-30 – 2015-12-31 (×2): 2 g via INTRAVENOUS
  Filled 2015-12-30 (×3): qty 2

## 2015-12-30 MED ORDER — PANTOPRAZOLE SODIUM 40 MG IV SOLR
40.0000 mg | Freq: Every day | INTRAVENOUS | Status: DC
  Administered 2015-12-30: 40 mg via INTRAVENOUS
  Filled 2015-12-30: qty 40

## 2015-12-30 MED ORDER — OXYCODONE-ACETAMINOPHEN 5-325 MG PO TABS: 1.0000 | ORAL_TABLET | ORAL | Status: DC | PRN

## 2015-12-30 MED ORDER — BUPIVACAINE-EPINEPHRINE (PF) 0.25% -1:200000 IJ SOLN
INTRAMUSCULAR | Status: AC
  Filled 2015-12-30: qty 30

## 2015-12-30 MED ORDER — MORPHINE SULFATE (PF) 4 MG/ML IV SOLN
4.0000 mg | Freq: Once | INTRAVENOUS | Status: AC
Start: 2015-12-30 — End: 2015-12-30
  Administered 2015-12-30: 4 mg via INTRAVENOUS
  Filled 2015-12-30: qty 1

## 2015-12-30 MED ORDER — ONDANSETRON HCL 4 MG/2ML IJ SOLN
INTRAMUSCULAR | Status: AC
  Administered 2015-12-30: 4 mg via INTRAVENOUS
  Filled 2015-12-30: qty 2

## 2015-12-30 MED ORDER — IPRATROPIUM-ALBUTEROL 0.5-2.5 (3) MG/3ML IN SOLN
3.0000 mL | Freq: Once | RESPIRATORY_TRACT | Status: AC
  Administered 2015-12-30: 3 mL via RESPIRATORY_TRACT

## 2015-12-30 MED ORDER — ACETAMINOPHEN 10 MG/ML IV SOLN
INTRAVENOUS | Status: DC | PRN
  Administered 2015-12-30: 1000 mg via INTRAVENOUS

## 2015-12-30 MED ORDER — BUPIVACAINE-EPINEPHRINE (PF) 0.25% -1:200000 IJ SOLN
INTRAMUSCULAR | Status: DC | PRN
  Administered 2015-12-30: 30 mL

## 2015-12-30 MED ORDER — LIDOCAINE HCL (CARDIAC) 20 MG/ML IV SOLN
INTRAVENOUS | Status: DC | PRN
  Administered 2015-12-30: 100 mg via INTRAVENOUS

## 2015-12-30 MED ORDER — GI COCKTAIL ~~LOC~~
ORAL | Status: AC
  Administered 2015-12-30: 30 mL via ORAL
  Filled 2015-12-30: qty 30

## 2015-12-30 MED ORDER — ONDANSETRON 4 MG PO TBDP: 4.0000 mg | ORAL_TABLET | Freq: Four times a day (QID) | ORAL | Status: DC | PRN

## 2015-12-30 MED ORDER — OXYCODONE HCL 5 MG PO TABS: 5.0000 mg | ORAL_TABLET | Freq: Once | ORAL | Status: DC | PRN

## 2015-12-30 MED ORDER — PROPOFOL 10 MG/ML IV BOLUS
INTRAVENOUS | Status: DC | PRN
  Administered 2015-12-30: 200 mg via INTRAVENOUS

## 2015-12-30 MED ORDER — ROCURONIUM BROMIDE 100 MG/10ML IV SOLN
INTRAVENOUS | Status: DC | PRN
  Administered 2015-12-30: 10 mg via INTRAVENOUS
  Administered 2015-12-30: 30 mg via INTRAVENOUS

## 2015-12-30 MED ORDER — ONDANSETRON 4 MG PO TBDP
4.0000 mg | ORAL_TABLET | Freq: Once | ORAL | Status: AC
  Administered 2015-12-30: 4 mg via ORAL

## 2015-12-30 MED ORDER — KETOROLAC TROMETHAMINE 30 MG/ML IJ SOLN: INTRAMUSCULAR | Status: DC | PRN

## 2015-12-30 MED ORDER — DIPHENHYDRAMINE HCL 50 MG/ML IJ SOLN: 12.5000 mg | Freq: Four times a day (QID) | INTRAMUSCULAR | Status: DC | PRN

## 2015-12-30 MED ORDER — LACTATED RINGERS IV SOLN
INTRAVENOUS | Status: DC
  Administered 2015-12-30: 100 mL/h via INTRAVENOUS

## 2015-12-30 MED ORDER — ASPIRIN EC 81 MG PO TBEC
81.0000 mg | DELAYED_RELEASE_TABLET | Freq: Every day | ORAL | Status: DC
  Administered 2015-12-30 – 2016-01-01 (×3): 81 mg via ORAL
  Filled 2015-12-30 (×5): qty 1

## 2015-12-30 MED ORDER — HYDRALAZINE HCL 20 MG/ML IJ SOLN: 10.0000 mg | INTRAMUSCULAR | Status: DC | PRN

## 2015-12-30 MED ORDER — AMLODIPINE BESYLATE 10 MG PO TABS
10.0000 mg | ORAL_TABLET | Freq: Every day | ORAL | Status: DC
  Administered 2015-12-30 – 2016-01-01 (×3): 10 mg via ORAL
  Filled 2015-12-30 (×3): qty 1

## 2015-12-30 MED ORDER — TIOTROPIUM BROMIDE MONOHYDRATE 18 MCG IN CAPS
18.0000 ug | ORAL_CAPSULE | Freq: Every day | RESPIRATORY_TRACT | Status: DC
  Administered 2015-12-30 – 2015-12-31 (×2): 18 ug via RESPIRATORY_TRACT
  Filled 2015-12-30: qty 5

## 2015-12-30 MED ORDER — IOHEXOL 300 MG/ML  SOLN
125.0000 mL | Freq: Once | INTRAMUSCULAR | Status: AC | PRN
  Administered 2015-12-30: 125 mL via INTRAVENOUS

## 2015-12-30 MED ORDER — KCL IN DEXTROSE-NACL 20-5-0.45 MEQ/L-%-% IV SOLN
INTRAVENOUS | Status: DC
  Administered 2015-12-30 – 2016-01-01 (×5): via INTRAVENOUS
  Filled 2015-12-30 (×8): qty 1000

## 2015-12-30 MED ORDER — IOHEXOL 240 MG/ML SOLN
25.0000 mL | Freq: Once | INTRAMUSCULAR | Status: AC | PRN
  Administered 2015-12-30: 25 mL via ORAL

## 2015-12-30 MED ORDER — FLUTICASONE PROPIONATE 50 MCG/ACT NA SUSP
2.0000 | Freq: Every day | NASAL | Status: DC
  Administered 2015-12-31 – 2016-01-01 (×2): 2 via NASAL
  Filled 2015-12-30: qty 16

## 2015-12-30 MED ORDER — DEXAMETHASONE SODIUM PHOSPHATE 10 MG/ML IJ SOLN
INTRAMUSCULAR | Status: DC | PRN
  Administered 2015-12-30: 10 mg via INTRAVENOUS

## 2015-12-30 MED ORDER — ACETAMINOPHEN 10 MG/ML IV SOLN
INTRAVENOUS | Status: AC
  Filled 2015-12-30: qty 100

## 2015-12-30 MED ORDER — OXYCODONE HCL 5 MG/5ML PO SOLN: 5.0000 mg | Freq: Once | ORAL | Status: DC | PRN

## 2015-12-30 MED ORDER — HYDROMORPHONE HCL 1 MG/ML IJ SOLN
1.0000 mg | Freq: Once | INTRAMUSCULAR | Status: AC
Start: 2015-12-30 — End: 2015-12-30
  Administered 2015-12-30: 1 mg via INTRAVENOUS
  Filled 2015-12-30: qty 1

## 2015-12-30 MED ORDER — PHENYLEPHRINE HCL 10 MG/ML IJ SOLN
INTRAMUSCULAR | Status: DC | PRN
  Administered 2015-12-30 (×3): 100 ug via INTRAVENOUS

## 2015-12-30 MED ORDER — FENTANYL CITRATE (PF) 100 MCG/2ML IJ SOLN
25.0000 ug | INTRAMUSCULAR | Status: DC | PRN
  Administered 2015-12-30: 25 ug via INTRAVENOUS

## 2015-12-30 SURGICAL SUPPLY — 48 items
ADH LQ OCL WTPRF AMP STRL LF (MISCELLANEOUS) ×1
ADHESIVE MASTISOL STRL (MISCELLANEOUS) ×3 IMPLANT
APPLIER CLIP ROT 10 11.4 M/L (STAPLE) ×3
APR CLP MED LRG 11.4X10 (STAPLE) ×1
BLADE SURG SZ11 CARB STEEL (BLADE) ×3 IMPLANT
CANISTER SUCT 1200ML W/VALVE (MISCELLANEOUS) ×3 IMPLANT
CATH CHOLANGI 4FR 420404F (CATHETERS) ×1 IMPLANT
CHLORAPREP W/TINT 26ML (MISCELLANEOUS) ×3 IMPLANT
CLIP APPLIE ROT 10 11.4 M/L (STAPLE) ×1 IMPLANT
CLOSURE WOUND 1/2 X4 (GAUZE/BANDAGES/DRESSINGS) ×1
CONRAY 60ML FOR OR (MISCELLANEOUS) ×3 IMPLANT
DRAPE C-ARM XRAY 36X54 (DRAPES) ×1 IMPLANT
ELECT REM PT RETURN 9FT ADLT (ELECTROSURGICAL) ×3
ELECTRODE REM PT RTRN 9FT ADLT (ELECTROSURGICAL) ×1 IMPLANT
ENDOPOUCH RETRIEVER 10 (MISCELLANEOUS) ×3 IMPLANT
GAUZE SPONGE 4X4 12PLY STRL (GAUZE/BANDAGES/DRESSINGS) ×2 IMPLANT
GAUZE SPONGE NON-WVN 2X2 STRL (MISCELLANEOUS) ×4 IMPLANT
GLOVE BIO SURGEON STRL SZ8 (GLOVE) ×15 IMPLANT
GOWN STRL REUS W/ TWL LRG LVL3 (GOWN DISPOSABLE) ×4 IMPLANT
GOWN STRL REUS W/TWL LRG LVL3 (GOWN DISPOSABLE) ×12
IRRIGATION STRYKERFLOW (MISCELLANEOUS) ×1 IMPLANT
IRRIGATOR STRYKERFLOW (MISCELLANEOUS) ×3
IV CATH ANGIO 12GX3 LT BLUE (NEEDLE) ×1 IMPLANT
IV NS 1000ML (IV SOLUTION) ×3
IV NS 1000ML BAXH (IV SOLUTION) ×1 IMPLANT
JACKSON PRATT 10 (INSTRUMENTS) ×3 IMPLANT
KIT RM TURNOVER STRD PROC AR (KITS) ×3 IMPLANT
LABEL OR SOLS (LABEL) ×3 IMPLANT
NDL SAFETY 22GX1.5 (NEEDLE) ×3 IMPLANT
NEEDLE VERESS 14GA 120MM (NEEDLE) ×3 IMPLANT
NS IRRIG 500ML POUR BTL (IV SOLUTION) ×3 IMPLANT
PACK LAP CHOLECYSTECTOMY (MISCELLANEOUS) ×3 IMPLANT
SCISSORS METZENBAUM CVD 33 (INSTRUMENTS) ×3 IMPLANT
SLEEVE ENDOPATH XCEL 5M (ENDOMECHANICALS) ×5 IMPLANT
SPONGE EXCIL AMD DRAIN 4X4 6P (MISCELLANEOUS) ×3 IMPLANT
SPONGE LAP 18X18 5 PK (GAUZE/BANDAGES/DRESSINGS) ×3 IMPLANT
SPONGE VERSALON 2X2 STRL (MISCELLANEOUS) ×12
STRIP CLOSURE SKIN 1/2X4 (GAUZE/BANDAGES/DRESSINGS) ×2 IMPLANT
SUT ETHILON 3-0 FS-10 30 BLK (SUTURE) ×3
SUT MNCRL 4-0 (SUTURE) ×6
SUT MNCRL 4-0 27XMFL (SUTURE) ×2
SUT VICRYL 0 AB UR-6 (SUTURE) ×3 IMPLANT
SUTURE EHLN 3-0 FS-10 30 BLK (SUTURE) IMPLANT
SUTURE MNCRL 4-0 27XMF (SUTURE) ×1 IMPLANT
SYR 20CC LL (SYRINGE) ×3 IMPLANT
TROCAR XCEL NON-BLD 11X100MML (ENDOMECHANICALS) ×3 IMPLANT
TROCAR XCEL NON-BLD 5MMX100MML (ENDOMECHANICALS) ×4 IMPLANT
TUBING INSUFFLATOR HI FLOW (MISCELLANEOUS) ×3 IMPLANT

## 2015-12-30 NOTE — Progress Notes (Signed)
Patient was met in the preop holding area. I reviewed his chart and discussed him with Dr. Adonis Huguenin. Patient's history was thoroughly reviewed.  I agree with Dr. Reginal Lutes assessment that he likely has acute cholecystitis and needs laparoscopic cholecystectomy for control of his symptoms. His LFTs are normal.  I discussed with him the rationale for surgery the options of observation the risks of bleeding infection recurrence of symptoms failure to resolve his symptoms (procedure bile duct damage bile duct leak retained common bile bile duct stone any of which could require further surgery and/or ERCP stent and papillotomy this is all reviewed for him he understood and agreed to proceed no family was present

## 2015-12-30 NOTE — Transfer of Care (Signed)
Immediate Anesthesia Transfer of Care Note  Patient: Dominic Turner  Procedure(s) Performed: Procedure(s): LAPAROSCOPIC CHOLECYSTECTOMY (N/A)  Patient Location: PACU  Anesthesia Type:General  Level of Consciousness: awake, alert , oriented and patient cooperative  Airway & Oxygen Therapy: Patient Spontanous Breathing and Patient connected to nasal cannula oxygen  Post-op Assessment: Report given to RN, Post -op Vital signs reviewed and stable and Patient moving all extremities  Post vital signs: Reviewed and stable  Last Vitals:  Filed Vitals:   12/30/15 1210 12/30/15 1457  BP: 155/81 151/76  Pulse: 91 92  Temp: 37.7 C 37.8 C  Resp: 20 24    Complications: No apparent anesthesia complications

## 2015-12-30 NOTE — Op Note (Signed)
Laparoscopic Cholecystectomy  Pre-operative Diagnosis: Acute cholecystitis  Post-operative Diagnosis: Acute cholecystitis with gangrene  Procedure: Laparoscopic cholecystectomy  Surgeon: Jerrol Banana. Burt Knack, MD FACS  Anesthesia: Gen. with endotracheal tube  Assistant: PA student  Procedure Details  The patient was seen again in the Holding Room. The benefits, complications, treatment options, and expected outcomes were discussed with the patient. The risks of bleeding, infection, recurrence of symptoms, failure to resolve symptoms, bile duct damage, bile duct leak, retained common bile duct stone, bowel injury, any of which could require further surgery and/or ERCP, stent, or papillotomy were reviewed with the patient. The likelihood of improving the patient's symptoms with return to their baseline status is good.  The patient and/or family concurred with the proposed plan, giving informed consent.  The patient was taken to Operating Room, identified as Dominic Turner and the procedure verified as Laparoscopic Cholecystectomy.  A Time Out was held and the above information confirmed.  Prior to the induction of general anesthesia, antibiotic prophylaxis was administered. VTE prophylaxis was in place. General endotracheal anesthesia was then administered and tolerated well. After the induction, the abdomen was prepped with Chloraprep and draped in the sterile fashion. The patient was positioned in the supine position.  Local anesthetic  was injected into the skin near the umbilicus and an incision made. The Veress needle was placed. Pneumoperitoneum was then created with CO2 and tolerated well without any adverse changes in the patient's vital signs. A 28mm port was placed in the periumbilical position and the abdominal cavity was explored.  Two 5-mm ports were placed in the right upper quadrant and a 12 mm epigastric port was placed all under direct vision. All skin incisions  were infiltrated with  a local anesthetic agent before making the incision and placing the trocars.   The patient was positioned  in reverse Trendelenburg, tilted slightly to the patient's left.  The gallbladder was identified, the fundus grasped and retracted cephalad. Adhesions were lysed bluntly. The gallbladder was encased in omentum and this was taken down bluntly The infundibulum was grasped and retracted laterally, exposing the peritoneum overlying the triangle of Calot. This was then divided and exposed in a blunt fashion. A critical view of the cystic duct and cystic artery was obtained.  The cystic duct was clearly identified and bluntly dissected.   There were multiple blood vessels both on the lateral and medial side of an engorged gallbladder which were doubly clipped and divided or divided with cautery. Once the gallbladder cystic duct juncture was well identified the cystic duct was then doubly clipped and divided as was another branch of the cystic artery.  The gallbladder was taken from the gallbladder fossa in a retrograde fashion with the electrocautery. The gallbladder was removed and placed in an Endocatch bag. The liver bed was irrigated and inspected. Hemostasis was achieved with the electrocautery. Copious irrigation was utilized and was repeatedly aspirated until clear.  The gallbladder and Endocatch sac were then removed through the epigastric port site.   It was noted on the gallbladder fossa that there was remnant gallbladder wall posteriorly in the gallbladder fossa this was cauterized to prevent mucocele.  A 10 mm JP drain was placed in the foramen of Winslow and the gallbladder fossa and brought out through a lateral port site and held in with 3-0 nylon  Inspection of the right upper quadrant was performed. No bleeding, bile duct injury or leak, or bowel injury was noted. Pneumoperitoneum was released.  The epigastric port site  was closed with figure-of-eight 0 Vicryl sutures. 4-0 subcuticular  Monocryl was used to close the skin. Steristrips and Mastisol and sterile dressings were  applied.  The patient was then extubated and brought to the recovery room in stable condition. Sponge, lap, and needle counts were correct at closure and at the conclusion of the case.   Findings: Acute Cholecystitis with gangrene  Estimated Blood Loss: 80 cc         Drains: JP 1         Specimens: Gallbladder           Complications: none               Dominic Turner E. Burt Knack, MD, FACS

## 2015-12-30 NOTE — ED Notes (Addendum)
Patient presents with c/o intermittent epigastric pain since New Year's; current episode started tonight. Patient's pain worse after eating. This episode different - pain moving up into chest and pain is nauseated. Supine positioning causes pain exacerbation.

## 2015-12-30 NOTE — Anesthesia Preprocedure Evaluation (Addendum)
Anesthesia Evaluation  Patient identified by MRN, date of birth, ID band Patient awake    Reviewed: Allergy & Precautions, H&P , NPO status , Patient's Chart, lab work & pertinent test results  Airway Mallampati: III  TM Distance: >3 FB Neck ROM: limited    Dental  (+) Poor Dentition, Chipped, Missing   Pulmonary neg shortness of breath, sleep apnea , COPD, Current Smoker,    Pulmonary exam normal breath sounds clear to auscultation       Cardiovascular Exercise Tolerance: Good hypertension, (-) angina(-) Past MI and (-) DOE Normal cardiovascular exam Rhythm:regular Rate:Normal     Neuro/Psych PSYCHIATRIC DISORDERS Depression negative neurological ROS     GI/Hepatic negative GI ROS, Neg liver ROS,   Endo/Other  negative endocrine ROS  Renal/GU negative Renal ROS  negative genitourinary   Musculoskeletal   Abdominal   Peds  Hematology negative hematology ROS (+)   Anesthesia Other Findings Past Medical History:   Pure hypercholesterolemia                                    Essential hypertension, benign                               Unspecified sleep apnea                                      Unspecified gastritis and gastroduodenitis wit*              History of adenomatous polyp of colon           02/2013         Comment:rec rpt 3 yrs   Tobacco use disorder                                         Overweight(278.02)                                           Bilateral high frequency sensorineural hearing*                Comment:rec hearing aides by ENT   Diverticulosis                                  02/2013         Comment:by colonoscopy   Beta thalassemia minor                          04/2013         Comment:presumed by CBC (consider periph smear and Hgb               EP)   Positive PPD, treated                           2014           Comment:s/p rifampin x50mo   COPD, severe ([place])  03/2014         Comment:Moderately severe obstruction, with low vital               capacity. Post bronchodilator test not               improved.  Past Surgical History:   COLONOSCOPY                                      03/03/2013      Comment:tubular adenoma x3, diverticulosis, rpt 3 yrs               (Dr. Cristina Gong)   CATARACT EXTRACTION, BILATERAL                  Bilateral             BMI    Body Mass Index   35.04 kg/m 2      Reproductive/Obstetrics negative OB ROS                            Anesthesia Physical Anesthesia Plan  ASA: III  Anesthesia Plan: General ETT and Rapid Sequence   Post-op Pain Management:    Induction:   Airway Management Planned:   Additional Equipment:   Intra-op Plan:   Post-operative Plan:   Informed Consent: I have reviewed the patients History and Physical, chart, labs and discussed the procedure including the risks, benefits and alternatives for the proposed anesthesia with the patient or authorized representative who has indicated his/her understanding and acceptance.   Dental Advisory Given  Plan Discussed with: Anesthesiologist, CRNA and Surgeon  Anesthesia Plan Comments:         Anesthesia Quick Evaluation

## 2015-12-30 NOTE — Progress Notes (Signed)
Pt came to floor at [place]. VSS. Pt was alert and oriented.

## 2015-12-30 NOTE — H&P (Signed)
Patient ID: Dominic Turner, male   DOB: [birth year], 70 y.o.   MRN: [id number]  CC: Abdominal pain  HPI Dominic Turner is a 70 y.o. male presents to the emergency department with a 3 week history of progressively worsening abdominal pain. Patient states that for the last 3 weeks he has right upper quadrant midepigastric abdominal pain after he eats. He states that especially worse after eating a fatty meal. Patient states that last night after dinner which included fried chicken, he had a worse pain he has had as to date. The pain did not go away after several hours so he brought himself to the emergency department. He has had associated nausea and vomiting with this attack. He denies any fevers, chills, chest pain, shortness of breath, diarrhea, constipation.  HPI  Past Medical History  Diagnosis Date  . Pure hypercholesterolemia   . Essential hypertension, benign   . Unspecified sleep apnea   . Unspecified gastritis and gastroduodenitis without mention of hemorrhage   . History of adenomatous polyp of colon 02/2013    rec rpt 3 yrs  . Tobacco use disorder   . Overweight(278.02)   . Bilateral high frequency sensorineural hearing loss     rec hearing aides by ENT  . Diverticulosis 02/2013    by colonoscopy  . 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 Positive PPD, treated 2014    s/p rifampin x84mo  . COPD, severe ([place]) 03/2014    Moderately severe obstruction, with low vital capacity. Post bronchodilator test not improved.    Past Surgical History  Procedure Laterality Date  . Colonoscopy  03/03/2013    tubular adenoma x3, diverticulosis, rpt 3 yrs (Dr. Cristina Gong)    Family History  Problem Relation Age of Onset  . Cirrhosis Father     died in 50's  . Hypertension Mother   . Heart attack Sister   . Stroke Sister   . Heart attack Maternal Grandmother   . Stroke Maternal Grandmother   . Cancer Neg Hx   . Thyroid disease Sister   .  Diabetes Sister     Social History Social History  Substance Use Topics  . Smoking status: Current Every Day Smoker -- 0.50 packs/day    Types: Cigarettes    Start date: 12/12/1983  . Smokeless tobacco: Never Used  . Alcohol Use: No    Allergies  Allergen Reactions  . Aspirin Other (See Comments)    REACTION: gi upset; baby ASA ok  . Sertraline Hcl Other (See Comments)    REACTION: sexual dysfunction, etc  . Sulfonamide Derivatives Other (See Comments)    Reaction: unknown  . Wellbutrin [Bupropion] Rash    Face rash and difficulty sleeping    No current facility-administered medications for this encounter.   Current Outpatient Prescriptions  Medication Sig Dispense Refill  . amLODipine (NORVASC) 10 MG tablet TAKE 1 TABLET (10 MG TOTAL) BY MOUTH DAILY. 90 tablet 3  . aspirin 81 MG tablet Take 81 mg by mouth daily.      . fluticasone (FLONASE) 50 MCG/ACT nasal spray Place 2 sprays into both nostrils daily. 48 g 3  . Multiple Vitamin Essential TABS Take 1 tablet by mouth daily.      . pravastatin (PRAVACHOL) 40 MG tablet TAKE 1 TABLET (40 MG TOTAL) BY MOUTH DAILY. 90 tablet 3  . tiotropium (SPIRIVA HANDIHALER) 18 MCG inhalation capsule Place 1 capsule (18 mcg total) into inhaler and  inhale at bedtime. 90 capsule 3  . triamcinolone cream (KENALOG) 0.1 % Apply 1 application topically 2 (two) times daily. (Patient not taking: Reported on 12/30/2015) 30 g 0     Review of Systems A multi-point review of systems was asked and was negative except for the findings documented in the history of present illness  Physical Exam Blood pressure 154/63, pulse 94, temperature 97.7 F (36.5 C), temperature source Oral, resp. rate 19, height 5\' 9"  (1.753 m), weight 111.131 kg (245 lb), SpO2 95 %. CONSTITUTIONAL: No acute distress. EYES: Pupils are equal, round, and reactive to light, Sclera are non-icteric. EARS, NOSE, MOUTH AND THROAT: The oropharynx is clear. The oral mucosa is pink and  moist. Hearing is intact to voice. LYMPH NODES:  Lymph nodes in the neck are normal. RESPIRATORY:  Lungs are clear. There is normal respiratory effort, with equal breath sounds bilaterally, and without pathologic use of accessory muscles. CARDIOVASCULAR: Heart is regular without murmurs, gallops, or rubs. GI: The abdomen is large, soft, tender to palpation in the right upper quadrant with a positive Murphy sign, and nondistended. There are no palpable masses. There is no hepatosplenomegaly. There are normal bowel sounds in all quadrants. GU: Rectal deferred.   MUSCULOSKELETAL: Normal muscle strength and tone. No cyanosis or edema.   SKIN: Turgor is good and there are no pathologic skin lesions or ulcers. NEUROLOGIC: Motor and sensation is grossly normal. Cranial nerves are grossly intact. PSYCH:  Oriented to person, place and time. Affect is normal.  Data Reviewed I personally reviewed the patient's labs and images. CT scan was obtained which shows thickened gallbladder wall with pericholecystic fluid consistent with acute cholecystitis. His only lab value of concern is a hypokalemia at 3.2. He has no elevated bilirubin or transaminases and has a normal white count. I have personally reviewed the patient's imaging, laboratory findings and medical records.    Assessment    Acute cholecystitis    Plan    70 year old male with a history, physical, CT scan consistent with acute cholecystitis. Discussed with the patient the treatment of acute cholecystitis including antibiotics and a laparoscopic cholecystectomy. Patient voiced understanding and wishes to proceed as he is tired of having pain. We'll bring patient under observation and post him for the operating room later today with Dr. Burt Knack.     Time spent with the patient was 45 minutes, with more than 50% of the time spent in face-to-face education, counseling and care coordination.     Clayburn Pert, MD FACS General  Surgeon 12/30/2015, 5:34 AM

## 2015-12-30 NOTE — ED Provider Notes (Addendum)
[facility] Emergency Department Provider Note  ____________________________________________  Time seen: 3:15 AM  I have reviewed the triage vital signs and the nursing notes.   HISTORY  Chief Complaint Abdominal Pain      HPI Dominic Turner is a 70 y.o. male presents with intermittent epigastric/right upper quadrant abdominal pain since New Year's. Patient states current episode started tonight currently 10 out of 10 right upper quadrant/epigastric pain. Patient states that the pain is worse after eating. Patient admits to vomiting times one.     Past Medical History  Diagnosis Date  . Pure hypercholesterolemia   . Essential hypertension, benign   . Unspecified sleep apnea   . Unspecified gastritis and gastroduodenitis without mention of hemorrhage   . History of adenomatous polyp of colon 02/2013    rec rpt 3 yrs  . Tobacco use disorder   . Overweight(278.02)   . Bilateral high frequency sensorineural hearing loss     rec hearing aides by ENT  . Diverticulosis 02/2013    by colonoscopy  . 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 Positive PPD, treated 2014    s/p rifampin x20mo  . COPD, severe ([place]) 03/2014    Moderately severe obstruction, with low vital capacity. Post bronchodilator test not improved.    Patient Active Problem List   Diagnosis Date Noted  . Advanced care planning/counseling discussion 01/15/2015  . Health maintenance examination 01/15/2015  . COPD, severe ([place]) 04/09/2014  . 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 Medicare annual wellness visit, subsequent 12/27/2012  . Hearing loss 12/27/2012  . Tinea capitis 04/17/2012  . ERECTILE DYSFUNCTION 03/25/2009  . ANXIETY DEPRESSION 10/23/2008  . OBSTRUCTIVE SLEEP APNEA 06/26/2008  . GASTRITIS 10/09/2007  . RECTAL BLEEDING 10/09/2007  . COLONIC POLYPS, HX OF 10/09/2007  . PROSTATITIS, HX OF  10/09/2007  . HYPERCHOLESTEROLEMIA, PURE 05/03/2007  . Obesity 05/03/2007  . TOBACCO ABUSE 05/03/2007  . HYPERTENSION, BENIGN ESSENTIAL 05/03/2007    Past Surgical History  Procedure Laterality Date  . Colonoscopy  03/03/2013    tubular adenoma x3, diverticulosis, rpt 3 yrs (Dr. Cristina Gong)    Current Outpatient Rx  Name  Route  Sig  Dispense  Refill  . amLODipine (NORVASC) 10 MG tablet      TAKE 1 TABLET (10 MG TOTAL) BY MOUTH DAILY.   90 tablet   3   . aspirin 81 MG tablet   Oral   Take 81 mg by mouth daily.           . fluticasone (FLONASE) 50 MCG/ACT nasal spray   Each Nare   Place 2 sprays into both nostrils daily.   48 g   3   . Multiple Vitamin Essential TABS   Oral   Take 1 tablet by mouth daily.           . pravastatin (PRAVACHOL) 40 MG tablet      TAKE 1 TABLET (40 MG TOTAL) BY MOUTH DAILY.   90 tablet   3   . tiotropium (SPIRIVA HANDIHALER) 18 MCG inhalation capsule   Inhalation   Place 1 capsule (18 mcg total) into inhaler and inhale at bedtime.   90 capsule   3   . triamcinolone cream (KENALOG) 0.1 %   Topical   Apply 1 application topically 2 (two) times daily.   30 g   0     Allergies Aspirin; Sertraline  hcl; Sulfonamide derivatives; and Wellbutrin  Family History  Problem Relation Age of Onset  . Cirrhosis Father     died in 44's  . Hypertension Mother   . Heart attack Sister   . Stroke Sister   . Heart attack Maternal Grandmother   . Stroke Maternal Grandmother   . Cancer Neg Hx   . Thyroid disease Sister   . Diabetes Sister     Social History Social History  Substance Use Topics  . Smoking status: Current Every Day Smoker -- 0.50 packs/day    Types: Cigarettes    Start date: 12/12/1983  . Smokeless tobacco: Never Used  . Alcohol Use: No    Review of Systems  Constitutional: Negative for fever. Eyes: Negative for visual changes. ENT: Negative for sore throat. Cardiovascular: Negative for chest pain. Respiratory:  Negative for shortness of breath. Gastrointestinal: Positive for abdominal pain and vomiting Genitourinary: Negative for dysuria. Musculoskeletal: Negative for back pain. Skin: Negative for rash. Neurological: Negative for headaches, focal weakness or numbness.   10-point ROS otherwise negative.  ____________________________________________   PHYSICAL EXAM:  VITAL SIGNS: ED Triage Vitals  Enc Vitals Group     BP 12/30/15 0051 170/86 mmHg     Pulse Rate 12/30/15 0051 71     Resp 12/30/15 0051 18     Temp 12/30/15 0051 97.7 F (36.5 C)     Temp Source 12/30/15 0051 Oral     SpO2 12/30/15 0051 100 %     Weight 12/30/15 0051 245 lb (111.131 kg)     Height 12/30/15 0051 5\' 9"  (1.753 m)     Head Cir --      Peak Flow --      Pain Score 12/30/15 0052 10     Pain Loc --      Pain Edu? --      Excl. in [place]? --      Constitutional: Alert and oriented. Well appearing and in no distress. Eyes: Conjunctivae are normal. PERRL. Normal extraocular movements. ENT   Head: Normocephalic and atraumatic.   Nose: No congestion/rhinnorhea.   Mouth/Throat: Mucous membranes are moist.   Neck: No stridor. Hematological/Lymphatic/Immunilogical: No cervical lymphadenopathy. Cardiovascular: Normal rate, regular rhythm. Normal and symmetric distal pulses are present in all extremities. No murmurs, rubs, or gallops. Respiratory: Normal respiratory effort without tachypnea nor retractions. Breath sounds are clear and equal bilaterally. No wheezes/rales/rhonchi. Gastrointestinal: Right upper quadrant tenderness to palpation. No distention. There is no CVA tenderness. Genitourinary: deferred Musculoskeletal: Nontender with normal range of motion in all extremities. No joint effusions.  No lower extremity tenderness nor edema. Neurologic:  Normal speech and language. No gross focal neurologic deficits are appreciated. Speech is normal.  Skin:  Skin is warm, dry and intact. No rash  noted. Psychiatric: Mood and affect are normal. Speech and behavior are normal. Patient exhibits appropriate insight and judgment.  ____________________________________________    LABS (pertinent positives/negatives)  Labs Reviewed  COMPREHENSIVE METABOLIC PANEL - Abnormal; Notable for the following:    Potassium 3.2 (*)    Glucose, Bld 117 (*)    All other components within normal limits  CBC - Abnormal; Notable for the following:    RBC 6.77 (*)    MCV 70.8 (*)    MCH 22.7 (*)    RDW 20.8 (*)    All other components within normal limits  URINALYSIS COMPLETEWITH MICROSCOPIC ([facility] ONLY) - Abnormal; Notable for the following:    Color, Urine YELLOW (*)  APPearance CLOUDY (*)    Bacteria, UA RARE (*)    All other components within normal limits  LIPASE, BLOOD  TROPONIN I       RADIOLOGY  CT Abdomen Pelvis W Contrast (Final result) Result time: 12/30/15 03:14:40   Final result by Rad Results In Interface (12/30/15 03:14:40)   Narrative:   CLINICAL DATA: Intermittent epigastric pain since new years, current episode beginning tonight, worse after eating. Pain radiates chest, nausea. History of hypertension, hyperlipidemia, diverticulosis.  EXAM: CT ABDOMEN AND PELVIS WITH CONTRAST  TECHNIQUE: Multidetector CT imaging of the abdomen and pelvis was performed using the standard protocol following bolus administration of intravenous contrast.  CONTRAST: 61mL OMNIPAQUE IOHEXOL 240 MG/ML SOLN, 157mL OMNIPAQUE IOHEXOL 300 MG/ML SOLN  COMPARISON: None.  FINDINGS: LUNG BASES: Included view of the lung bases are clear. Visualized heart and pericardium are unremarkable.  SOLID ORGANS: Gallbladder is mildly distended, mild wall thickening and mild pericholecystic fluid without CT findings of coli lithiasis. The liver demonstrates scattered benign-appearing cysts measuring up to 16 mm. Spleen, pancreas and adrenal glands are unremarkable.  GASTROINTESTINAL  TRACT: The stomach, small and large bowel are normal in course and caliber without inflammatory changes. Mild colonic diverticulosis. Normal appendix.  KIDNEYS/ URINARY TRACT: Kidneys are orthotopic, demonstrating symmetric enhancement. No nephrolithiasis, hydronephrosis or solid renal masses. The unopacified ureters are normal in course and caliber. Delayed imaging through the kidneys demonstrates symmetric prompt contrast excretion within the proximal urinary collecting system. Urinary bladder is partially distended and unremarkable.  PERITONEUM/RETROPERITONEUM: Aortoiliac vessels are normal in course and caliber, mild calcific atherosclerosis. No lymphadenopathy by CT size criteria. Prostate is mildly enlarged invading the base of the bladder. No intraperitoneal free fluid nor free air.  SOFT TISSUE/OSSEOUS STRUCTURES: Non-suspicious. Small fat containing inguinal and umbilical hernias. Moderate to severe L5-S1 degenerative disc resulting in mild to moderate neural foraminal narrowing.  IMPRESSION: CT findings of acute cholecystitis.  Mild colonic diverticulosis without CT findings of acute diverticulitis.   Electronically Signed By: Elon Alas M.D. On: 12/30/2015 03:14      ED ECG REPORT I, BROWN, [place] N, the attending physician, personally viewed and interpreted this ECG.   Date: 12/30/2015  EKG Time: 1:52 AM  Rate: 67  Rhythm: Normal sinus rhythm  Axis: None  Intervals: Normal  ST&T Change: None     INITIAL IMPRESSION / ASSESSMENT AND PLAN / ED COURSE  Pertinent labs & imaging results that were available during my care of the patient were reviewed by me and considered in my medical decision making (see chart for details).  Patient given IV morphine 4 mg and Zofran 4 mg. Patient discussed with Dr. Gwyndolyn Saxon with plan for hospital admission for cholecystectomy  ____________________________________________   FINAL CLINICAL IMPRESSION(S) / ED  DIAGNOSES  Final diagnoses:  Acute cholecystitis      Gregor Hams, MD 12/30/15 [place], MD 12/30/15 [phone]

## 2015-12-30 NOTE — ED Notes (Signed)
Patient vomited in lobby. MD made aware. VORB for Zofran 4mg  ODT. Order to be entered and carried by this RN.

## 2015-12-30 NOTE — ED Notes (Addendum)
Labs reviewed with MD; results negative. MD with VORB for CT abd/pelvis with contrast and Zofran 4mg  IVP x 1 dose. Order to be entered and carried by this RN.

## 2015-12-30 NOTE — ED Notes (Signed)
Pt a/o, vss. Sitting on side of bed. oob to BR independently. Pt states pain has improved to 4/10

## 2015-12-30 NOTE — Anesthesia Procedure Notes (Signed)
Procedure Name: Intubation Date/Time: 12/30/2015 1:42 PM Performed by: Alda Berthold Pre-anesthesia Checklist: Patient identified, Patient being monitored, Timeout performed, Emergency Drugs available and Suction available Patient Re-evaluated:Patient Re-evaluated prior to inductionOxygen Delivery Method: Circle system utilized Preoxygenation: Pre-oxygenation with 100% oxygen Intubation Type: IV induction Ventilation: Mask ventilation without difficulty Laryngoscope Size: Mac and 4 Grade View: Grade II Tube type: Oral Tube size: 7.0 mm Number of attempts: 1 Placement Confirmation: ETT inserted through vocal cords under direct vision,  positive ETCO2 and breath sounds checked- equal and bilateral Secured at: 22 cm Tube secured with: Tape Dental Injury: Teeth and Oropharynx as per pre-operative assessment

## 2015-12-31 LAB — COMPREHENSIVE METABOLIC PANEL
ALT: 322 U/L — ABNORMAL HIGH (ref 17–63)
ANION GAP: 9 (ref 5–15)
AST: 155 U/L — ABNORMAL HIGH (ref 15–41)
Albumin: 3.3 g/dL — ABNORMAL LOW (ref 3.5–5.0)
Alkaline Phosphatase: 127 U/L — ABNORMAL HIGH (ref 38–126)
BUN: 13 mg/dL (ref 6–20)
CHLORIDE: 102 mmol/L (ref 101–111)
CO2: 25 mmol/L (ref 22–32)
CREATININE: 1.16 mg/dL (ref 0.61–1.24)
Calcium: 9 mg/dL (ref 8.9–10.3)
Glucose, Bld: 139 mg/dL — ABNORMAL HIGH (ref 65–99)
POTASSIUM: 3.4 mmol/L — AB (ref 3.5–5.1)
Sodium: 136 mmol/L (ref 135–145)
Total Bilirubin: 1 mg/dL (ref 0.3–1.2)
Total Protein: 6.3 g/dL — ABNORMAL LOW (ref 6.5–8.1)

## 2015-12-31 LAB — CBC WITH DIFFERENTIAL/PLATELET
Basophils Absolute: 0 10*3/uL (ref 0–0.1)
Basophils Relative: 0 %
EOS ABS: 0 10*3/uL (ref 0–0.7)
EOS PCT: 0 %
HCT: 45.2 % (ref 40.0–52.0)
Hemoglobin: 14.4 g/dL (ref 13.0–18.0)
LYMPHS ABS: 0.7 10*3/uL — AB (ref 1.0–3.6)
LYMPHS PCT: 4 %
MCH: 22.4 pg — AB (ref 26.0–34.0)
MCHC: 31.9 g/dL — AB (ref 32.0–36.0)
MCV: 70 fL — AB (ref 80.0–100.0)
MONO ABS: 1.4 10*3/uL — AB (ref 0.2–1.0)
Monocytes Relative: 9 %
Neutro Abs: 14.1 10*3/uL — ABNORMAL HIGH (ref 1.4–6.5)
Neutrophils Relative %: 87 %
PLATELETS: 225 10*3/uL (ref 150–440)
RBC: 6.45 MIL/uL — ABNORMAL HIGH (ref 4.40–5.90)
RDW: 21 % — AB (ref 11.5–14.5)
WBC: 16.2 10*3/uL — ABNORMAL HIGH (ref 3.8–10.6)

## 2015-12-31 MED ORDER — PANTOPRAZOLE SODIUM 40 MG PO TBEC
40.0000 mg | DELAYED_RELEASE_TABLET | Freq: Two times a day (BID) | ORAL | Status: DC
  Administered 2015-12-31 – 2016-01-01 (×3): 40 mg via ORAL
  Filled 2015-12-31 (×3): qty 1

## 2015-12-31 MED ORDER — OXYCODONE-ACETAMINOPHEN 5-325 MG PO TABS: 1.0000 | ORAL_TABLET | ORAL | Status: DC | PRN

## 2015-12-31 NOTE — Plan of Care (Signed)
Problem: Activity: Goal: Ability to return to normal activity level will improve Outcome: Completed/Met Date Met:  12/31/15 Pt has ambulated several times today.  Problem: Pain Management: Goal: General experience of comfort will improve Outcome: Completed/Met Date Met:  12/31/15 Pt has has no pain today.

## 2015-12-31 NOTE — Anesthesia Postprocedure Evaluation (Signed)
Anesthesia Post Note  Patient: Dominic Turner  Procedure(s) Performed: Procedure(s) (LRB): LAPAROSCOPIC CHOLECYSTECTOMY (N/A)  Patient location during evaluation: PACU Anesthesia Type: General Level of consciousness: awake and alert and oriented Pain management: pain level controlled Vital Signs Assessment: post-procedure vital signs reviewed and stable Respiratory status: spontaneous breathing Cardiovascular status: blood pressure returned to baseline Anesthetic complications: no    Last Vitals:  Filed Vitals:   12/30/15 2029 12/31/15 0354  BP: 142/72 145/74  Pulse: 94 87  Temp: 37.4 C 36.9 C  Resp: 16 18    Last Pain:  Filed Vitals:   12/31/15 0739  PainSc: 0-No pain                 Kort Stettler

## 2015-12-31 NOTE — Care Management Obs Status (Signed)
[place] NOTIFICATION   Patient Details  Name: Dominic Turner MRN: [id number] Date of Birth: 1946-10-30   Medicare Observation Status Notification Given:  Yes reviewed and signed by patient.  Copy sent to HIM    Beverly Sessions, RN 12/31/2015, 10:46 AM

## 2015-12-31 NOTE — Progress Notes (Signed)
Pt has ambulated several times in hallway.

## 2015-12-31 NOTE — Plan of Care (Signed)
Problem: Bowel/Gastric: Goal: Gastrointestinal status for postoperative course will improve Outcome: Completed/Met Date Met:  12/31/15 Pt has had BM     

## 2015-12-31 NOTE — Progress Notes (Signed)
Feeling much better this morning. He is afebrile. He is not nauseated. He had a large bowel movement this morning. He is passing gas normally. There is moderate bloody drainage in his JP drain but there is no evidence of any bile. He is tolerating a liquid diet. We will advance him to a regular diet. His overall progress is excellent.

## 2015-12-31 NOTE — Discharge Instructions (Signed)

## 2016-01-01 MED ORDER — AMOXICILLIN-POT CLAVULANATE 875-125 MG PO TABS: 1.0000 | ORAL_TABLET | Freq: Two times a day (BID) | ORAL | Status: DC

## 2016-01-01 NOTE — Progress Notes (Signed)
Pt d/c home; d/c instructions reviewed w/ pt; pt understanding was verbalized; IV removed catheter in tact, gauze dressing applied; dressings on incision sites CDI at d/c; pt d/c w/ JP drain, dressing CDI; pt teaching and demonstration done w/ teach-back and return demonstration by pt of JP drain care; pt verbalized understanding re JP care; all pt questions answered; pt left unit via wheelchair accompanied by staff

## 2016-01-01 NOTE — Discharge Summary (Signed)
Patient ID: Dominic Turner MRN: [id number] DOB/AGE: 12-12-1945 70 y.o.  Admit date: 12/30/2015 Discharge date: 01/01/2016  Discharge Diagnoses:  Cholecystitis  Procedures Performed: Laparoscopic cholecystectomy  Discharged Condition: good  Hospital Course: Patient admitted from the emergency department with acute cholecystitis. Taken to the operating room for a laparoscopic cholecystectomy that he tolerated well. Stable tolerating oral diet and have pain control with oral medications prior to discharge. Required a drain placed in the operating room secondary to infected field leading discharge home with a course of oral Avelox as well.  Discharge Orders: Discharge Instructions    Diet - low sodium heart healthy    Complete by:  As directed      Increase activity slowly    Complete by:  As directed      No dressing needed    Complete by:  As directed            Disposition: Discharge home  Discharge Medications:  Current facility-administered medications:  .  amLODipine (NORVASC) tablet 10 mg, 10 mg, Oral, Daily, Florene Glen, MD, 10 mg at 01/01/16 0958 .  aspirin EC tablet 81 mg, 81 mg, Oral, Daily, Florene Glen, MD, 81 mg at 01/01/16 0959 .  fluticasone (FLONASE) 50 MCG/ACT nasal spray 2 spray, 2 spray, Each Nare, Daily, Florene Glen, MD, 2 spray at 01/01/16 [phone] .  multivitamin with minerals tablet 1 tablet, 1 tablet, Oral, Daily, Florene Glen, MD, 1 tablet at 01/01/16 [phone] .  oxyCODONE-acetaminophen (PERCOCET/ROXICET) 5-325 MG per tablet 1-2 tablet, 1-2 tablet, Oral, Q4H PRN, Hubbard Robinson, MD .  pantoprazole (PROTONIX) EC tablet 40 mg, 40 mg, Oral, BID, Dia Crawford III, MD, 40 mg at 01/01/16 0959 .  pravastatin (PRAVACHOL) tablet 40 mg, 40 mg, Oral, Daily, Florene Glen, MD, 40 mg at 01/01/16 0959 .  tiotropium (SPIRIVA) inhalation capsule 18 mcg, 18 mcg, Inhalation, QHS, Florene Glen, MD, 18 mcg at 12/31/15 2245 -Augmentin 875/125mg  - 1 tab by  mouth every 12 hours  Follwup: Follow-up Information    Follow up with [facility]. Schedule an appointment as soon as possible for a visit in 1 week.   Why:  For suture removal, For wound re-check   Contact information:   [address] [place] [id number]      Signed: Clayburn Pert 01/01/2016, 10:54 AM

## 2016-01-01 NOTE — Final Progress Note (Signed)
2 Days Post-Op   Subjective:  Patient without acute events overnight. Tolerated regular diet and desires to go home.  Vital signs in last 24 hours: Temp:  [97.9 F (36.6 C)-98.9 F (37.2 C)] 98.2 F (36.8 C) (01/21 0519) Pulse Rate:  [67-102] 67 (01/21 0519) Resp:  [18-22] 22 (01/20 2134) BP: (120-146)/(56-73) 134/73 mmHg (01/21 0958) SpO2:  [94 %-100 %] 100 % (01/21 0519) Last BM Date: 12/31/15  Intake/Output from previous day: 01/20 0701 - 01/21 0700 In: 3147 [P.O.:1180; I.V.:1967] Out: 1380 [Urine:1350; Drains:30]  GI: Abdomen soft, nontender, nondistended. JP drain in place in the right upper quadrant draining serous and was fluid. No evidence of peritonitis or erythema.  Lab Results:  CBC  Recent Labs  12/30/15 0056 12/31/15 0526  WBC 8.2 16.2*  HGB 15.4 14.4  HCT 48.0 45.2  PLT 259 225   CMP     Component Value Date/Time   NA 136 12/31/2015 0526   K 3.4* 12/31/2015 0526   CL 102 12/31/2015 0526   CO2 25 12/31/2015 0526   GLUCOSE 139* 12/31/2015 0526   BUN 13 12/31/2015 0526   CREATININE 1.16 12/31/2015 0526   CALCIUM 9.0 12/31/2015 0526   PROT 6.3* 12/31/2015 0526   ALBUMIN 3.3* 12/31/2015 0526   AST 155* 12/31/2015 0526   ALT 322* 12/31/2015 0526   ALKPHOS 127* 12/31/2015 0526   BILITOT 1.0 12/31/2015 0526   GFRNONAA >60 12/31/2015 0526   GFRAA >60 12/31/2015 0526   PT/INR No results for input(s): LABPROT, INR in the last 72 hours.  Studies/Results: No results found.  Assessment/Plan: 70 year old male status post lap scopic cholecystectomy for acute cholecystitis. Doing well. Desires to go home. Discharge home with JP drain and oral antibiotics. Follow-up in clinic in 1 week for drain removal.   Clayburn Pert, MD [facility] Surgeon  01/01/2016

## 2016-01-03 ENCOUNTER — Telehealth: Payer: Self-pay | Admitting: General Surgery

## 2016-01-03 LAB — SURGICAL PATHOLOGY

## 2016-01-03 NOTE — Telephone Encounter (Signed)
Please call patient with post op appointment day/time. Cell# [phone]  Home# [phone]. Patient called to schedule his post-op / follow-up visit with Dr Adonis Huguenin this week. He had Lap Chole with Dr Burt Knack on Thursday 1/19. He was discharged by Dr Adonis Huguenin over the weekend on Saturday 1/21. He was told to come in for a post op visit this week with [facility] to have his drain removed. I advised patient that we will call him back with the day/time to come in and be seen. We are waiting for Dr Adonis Huguenin to come to the office this morning to let us know what day this week he is going to see patients that were discharged over the weekend.

## 2016-01-03 NOTE — Telephone Encounter (Signed)
Returned phone call to patient at this time. No answer. Unable to leave voicemail as phone just rang and rang, then beeped over and over until it shut off. Would be glad to speak with patient should he call back.

## 2016-01-03 NOTE — Telephone Encounter (Signed)
Patient is having concerns because ever since the drain has been placed there is yellow drainage and his neighbor told him it shouldn't look like that. Reassured patient that it is draining exactly the way that it should and that he should just continue antibiotics as prescribed until they are completely gone.   Encouraged to call back with any other concerns or questions that he has.

## 2016-01-03 NOTE — Telephone Encounter (Signed)
Returned phone call at this time. Patient is having no nausea and vomiting. States that he has been cold since getting home but denies fever and has not had any hot flashes. Drain is being emptied daily for approximately 1 oz. per patient. No other questions or concerns at this time.  Pt is continuing antibiotics that he was sent home on, encouraged to continue until completely gone.  Explained to patient that I need him to call me if drain stops producing any output for 24 hours and we need to get him in earlier for drain to be removed. Also asked him to call if he develops a fever or begins to have hot flashes along with the cold feelings he has been having. Pt verbalizes understanding.   Pt placed on schedule for 01/10/16 with Dr. Burt Knack in [place].

## 2016-01-03 NOTE — Telephone Encounter (Signed)
Patient would like to be called back to discuss the drainage from his drain. He states that it is yellow and is not sure if this is normal.

## 2016-01-04 ENCOUNTER — Telehealth: Payer: Self-pay | Admitting: Surgery

## 2016-01-04 NOTE — Telephone Encounter (Signed)
Please call patient, his drainage tube is leaking.

## 2016-01-04 NOTE — Telephone Encounter (Signed)
Returned phone call to the patient at this time. Upon talking with him, he has not changed the bandage from the hospital because he was not told to do this. I informed him that he needs to clean this area daily with soap and water, dry thoroughly, and then replace guaze. The JP is still functioning per patient as he emptied a teaspoon out just this morning. It is holding suction still as well. I informed patient that this small amount of drainage around the tube is expected and this is why he needs to change guaze daily. Patient denies any symptoms at all. Encouraged to call back with any further questions.

## 2016-01-07 DIAGNOSIS — L309 Dermatitis, unspecified: Secondary | ICD-10-CM | POA: Diagnosis not present

## 2016-01-07 DIAGNOSIS — L218 Other seborrheic dermatitis: Secondary | ICD-10-CM | POA: Diagnosis not present

## 2016-01-07 DIAGNOSIS — L818 Other specified disorders of pigmentation: Secondary | ICD-10-CM | POA: Diagnosis not present

## 2016-01-10 ENCOUNTER — Other Ambulatory Visit: Payer: Self-pay

## 2016-01-10 ENCOUNTER — Encounter: Payer: Self-pay | Admitting: Surgery

## 2016-01-10 ENCOUNTER — Ambulatory Visit (INDEPENDENT_AMBULATORY_CARE_PROVIDER_SITE_OTHER): Payer: Medicare Other | Admitting: Surgery

## 2016-01-10 VITALS — BP 147/82 | HR 71 | Temp 98.3°F | Ht 69.0 in | Wt 237.0 lb

## 2016-01-10 DIAGNOSIS — K810 Acute cholecystitis: Secondary | ICD-10-CM

## 2016-01-10 NOTE — Progress Notes (Signed)
Status post laparoscopic cholecystectomy. Patient feels well as tolerating a regular diet has occasional loose stools but no fevers or chills.   abdomen is soft and nontender drain is in place with no bile and no erythema other wounds are healing well without erythema or drainage.   Drain is removed and a dressing is placed   She doing very well recommend follow up on an as-needed basis

## 2016-01-12 ENCOUNTER — Other Ambulatory Visit: Payer: Self-pay | Admitting: Family Medicine

## 2016-01-12 DIAGNOSIS — Z125 Encounter for screening for malignant neoplasm of prostate: Secondary | ICD-10-CM

## 2016-01-12 DIAGNOSIS — Z1159 Encounter for screening for other viral diseases: Secondary | ICD-10-CM

## 2016-01-12 DIAGNOSIS — D563 Thalassemia minor: Secondary | ICD-10-CM

## 2016-01-12 DIAGNOSIS — E7800 Pure hypercholesterolemia, unspecified: Secondary | ICD-10-CM

## 2016-01-12 DIAGNOSIS — I10 Essential (primary) hypertension: Secondary | ICD-10-CM

## 2016-01-14 ENCOUNTER — Other Ambulatory Visit: Payer: Self-pay | Admitting: Family Medicine

## 2016-01-14 ENCOUNTER — Other Ambulatory Visit (INDEPENDENT_AMBULATORY_CARE_PROVIDER_SITE_OTHER): Payer: Medicare Other

## 2016-01-14 DIAGNOSIS — E7800 Pure hypercholesterolemia, unspecified: Secondary | ICD-10-CM

## 2016-01-14 DIAGNOSIS — D563 Thalassemia minor: Secondary | ICD-10-CM | POA: Diagnosis not present

## 2016-01-14 DIAGNOSIS — Z1159 Encounter for screening for other viral diseases: Secondary | ICD-10-CM

## 2016-01-14 DIAGNOSIS — I10 Essential (primary) hypertension: Secondary | ICD-10-CM | POA: Diagnosis not present

## 2016-01-14 DIAGNOSIS — Z125 Encounter for screening for malignant neoplasm of prostate: Secondary | ICD-10-CM | POA: Diagnosis not present

## 2016-01-14 LAB — CBC WITH DIFFERENTIAL/PLATELET
BASOS ABS: 0.1 10*3/uL (ref 0.0–0.1)
Basophils Relative: 1 % (ref 0.0–3.0)
EOS ABS: 0.4 10*3/uL (ref 0.0–0.7)
Eosinophils Relative: 8.4 % — ABNORMAL HIGH (ref 0.0–5.0)
HCT: 45.4 % (ref 39.0–52.0)
Hemoglobin: 14.6 g/dL (ref 13.0–17.0)
LYMPHS ABS: 1.9 10*3/uL (ref 0.7–4.0)
Lymphocytes Relative: 36.4 % (ref 12.0–46.0)
MCHC: 32.1 g/dL (ref 30.0–36.0)
MCV: 71.6 fl — AB (ref 78.0–100.0)
MONO ABS: 0.4 10*3/uL (ref 0.1–1.0)
MONOS PCT: 8.7 % (ref 3.0–12.0)
NEUTROS ABS: 2.3 10*3/uL (ref 1.4–7.7)
NEUTROS PCT: 45.5 % (ref 43.0–77.0)
PLATELETS: 283 10*3/uL (ref 150.0–400.0)
RBC: 6.34 Mil/uL — AB (ref 4.22–5.81)
RDW: 21.4 % — ABNORMAL HIGH (ref 11.5–15.5)
WBC: 5.2 10*3/uL (ref 4.0–10.5)

## 2016-01-14 LAB — COMPREHENSIVE METABOLIC PANEL
ALK PHOS: 100 U/L (ref 39–117)
ALT: 17 U/L (ref 0–53)
AST: 14 U/L (ref 0–37)
Albumin: 3.7 g/dL (ref 3.5–5.2)
BUN: 7 mg/dL (ref 6–23)
CALCIUM: 9.7 mg/dL (ref 8.4–10.5)
CO2: 30 meq/L (ref 19–32)
Chloride: 105 mEq/L (ref 96–112)
Creatinine, Ser: 0.96 mg/dL (ref 0.40–1.50)
GFR: 100.12 mL/min (ref >60.00)
GLUCOSE: 96 mg/dL (ref 70–99)
POTASSIUM: 4.4 meq/L (ref 3.5–5.1)
Sodium: 141 mEq/L (ref 135–145)
Total Bilirubin: 0.6 mg/dL (ref 0.2–1.2)
Total Protein: 5.9 g/dL — ABNORMAL LOW (ref 6.0–8.3)

## 2016-01-14 LAB — LIPID PANEL
CHOL/HDL RATIO: 5
Cholesterol: 165 mg/dL (ref 0–200)
HDL: 35.7 mg/dL — AB (ref >39.00)
LDL Cholesterol: 104 mg/dL — ABNORMAL HIGH (ref 0–99)
NONHDL: 129.1
Triglycerides: 126 mg/dL (ref 0.0–149.0)
VLDL: 25.2 mg/dL (ref 0.0–40.0)

## 2016-01-14 LAB — PSA, MEDICARE: PSA: 1.18 ng/mL (ref 0.10–4.00)

## 2016-01-15 LAB — HEPATITIS C ANTIBODY: HCV Ab: NEGATIVE

## 2016-01-17 ENCOUNTER — Ambulatory Visit (INDEPENDENT_AMBULATORY_CARE_PROVIDER_SITE_OTHER): Payer: Medicare Other | Admitting: Family Medicine

## 2016-01-17 ENCOUNTER — Encounter: Payer: Self-pay | Admitting: Family Medicine

## 2016-01-17 VITALS — BP 140/80 | HR 80 | Temp 98.0°F | Wt 234.8 lb

## 2016-01-17 DIAGNOSIS — F17200 Nicotine dependence, unspecified, uncomplicated: Secondary | ICD-10-CM | POA: Diagnosis not present

## 2016-01-17 DIAGNOSIS — R51 Headache: Secondary | ICD-10-CM | POA: Diagnosis not present

## 2016-01-17 DIAGNOSIS — R519 Headache, unspecified: Secondary | ICD-10-CM

## 2016-01-17 MED ORDER — AMOXICILLIN-POT CLAVULANATE 875-125 MG PO TABS: 1.0000 | ORAL_TABLET | Freq: Two times a day (BID) | ORAL | Status: AC

## 2016-01-17 MED ORDER — IBUPROFEN 600 MG PO TABS: 600.0000 mg | ORAL_TABLET | Freq: Three times a day (TID) | ORAL | Status: DC | PRN

## 2016-01-17 NOTE — Progress Notes (Signed)
Pre visit review using our clinic review tool, if applicable. No additional management support is needed unless otherwise documented below in the visit note. 

## 2016-01-17 NOTE — Assessment & Plan Note (Signed)
Headache with some subjective L facial numbness but on exam no numbness appreciated. No other neurological symptoms today to indicate stroke. Anticipate early sinusitis - treat with ibuprofen and augmentin course. Offered head CT to help r/o stroke, eval for sinusitis - pt declines. Extensively discussed red flag symptoms to indicate emergent need for ER evaluation overnight. Pt has close f/u with me tomorrow am for CPE 8:30am. BP stable today.

## 2016-01-17 NOTE — Assessment & Plan Note (Signed)
Continue to encourage cessation. 

## 2016-01-17 NOTE — Patient Instructions (Addendum)
Exam looking ok today. Possible early sinus infection. Treat with augmentin sent to pharmacy. May take ibuprofen 600mg .  If any worsening numbness or slurring of speech or weakness please call 911 immediately. Work on quitting smoking.

## 2016-01-17 NOTE — Progress Notes (Signed)
BP 140/80 mmHg  Pulse 80  Temp(Src) 98 F (36.7 C) (Oral)  Wt 234 lb 12 oz (106.482 kg)  SpO2 97%   CC: headache  Subjective:    Patient ID: Dominic Turner, male    DOB: Apr 15, 1946, 70 y.o.   MRN: [id number]  HPI: Per Notaro is a 70 y.o. male presenting on 01/17/2016 for Sinusitis   1d h/o L sided facial numbness that started around lips, associated with L frontal headache. L nostril feels stopped up with congestion. L upper tooth ache. Checked BP - 133/73. Some clear drainage from left nose.   No other paresthesias, no unilateral weakness. No slurred speech or confusion. No vision changes. No fevers/chills, cough, ear pain, ST, PNdrainage.  Tylenol not helpful. Taking ibuprofen which eased headache.  Continues smoking 1/2 ppd. 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 Not around anyone sick recently.  Tried flonase without improvement.   Recent cataract extraction (bilateraly) followed by emergent cholecystectomy 12/2015.   Relevant past medical, surgical, family and social history reviewed and updated as indicated. Interim medical history since our last visit reviewed. Allergies and medications reviewed and updated. Current Outpatient Prescriptions on File Prior to Visit  Medication Sig  . amLODipine (NORVASC) 10 MG tablet TAKE 1 TABLET (10 MG TOTAL) BY MOUTH DAILY.  Marland Kitchen aspirin 81 MG tablet Take 81 mg by mouth daily.    . clobetasol (TEMOVATE) 0.05 % external solution Apply 1 application topically.  . fluticasone (FLONASE) 50 MCG/ACT nasal spray Place 2 sprays into both nostrils daily.  . Multiple Vitamin Essential TABS Take 1 tablet by mouth daily.    . pravastatin (PRAVACHOL) 40 MG tablet TAKE 1 TABLET (40 MG TOTAL) BY MOUTH DAILY.  Marland Kitchen tiotropium (SPIRIVA HANDIHALER) 18 MCG inhalation capsule Place 1 capsule (18 mcg total) into inhaler and inhale at bedtime.  . triamcinolone cream (KENALOG) 0.1 % Apply 1 application topically 2 (two) times daily.   No current  facility-administered medications on file prior to visit.    Review of Systems Per HPI unless specifically indicated in ROS section     Objective:    BP 140/80 mmHg  Pulse 80  Temp(Src) 98 F (36.7 C) (Oral)  Wt 234 lb 12 oz (106.482 kg)  SpO2 97%  Wt Readings from Last 3 Encounters:  01/17/16 234 lb 12 oz (106.482 kg)  01/10/16 237 lb (107.502 kg)  12/30/15 237 lb 6.4 oz (107.684 kg)    Physical Exam  Constitutional: He is oriented to person, place, and time. He appears well-developed and well-nourished. No distress.  HENT:  Head: Normocephalic and atraumatic.  Right Ear: Hearing, tympanic membrane, external ear and ear canal normal.  Left Ear: Hearing, tympanic membrane, external ear and ear canal normal.  Nose: Mucosal edema present. No rhinorrhea. Right sinus exhibits no maxillary sinus tenderness and no frontal sinus tenderness. Left sinus exhibits no maxillary sinus tenderness and no frontal sinus tenderness.  Mouth/Throat: Uvula is midline, oropharynx is clear and moist and mucous membranes are normal. No oropharyngeal exudate, posterior oropharyngeal edema, posterior oropharyngeal erythema or tonsillar abscesses.  Nasal mucosal congestion No dental pain Uvula rises symmetrically  Eyes: Conjunctivae and EOM are normal. Pupils are equal, round, and reactive to light. No scleral icterus.  Neck: Normal range of motion. Neck supple. Carotid bruit is not present.  Cardiovascular: Normal rate, regular rhythm, normal heart sounds and intact distal pulses.   No murmur heard. Pulmonary/Chest: Effort normal and breath sounds normal. No respiratory distress. He has no wheezes. He  has no rales.  Lymphadenopathy:    He has no cervical adenopathy.  Neurological: He is alert and oriented to person, place, and time. He has normal strength. No cranial nerve deficit or sensory deficit. Coordination and gait normal.  CN 2-12 intact FTN intact EOMI  No reproducible numbness on left face  to temperature or light touch No facial weakness Speech intact  Skin: Skin is warm and dry. No rash noted.  Nursing note and vitals reviewed.  Results for orders placed or performed in visit on 01/14/16  Lipid panel  Result Value Ref Range   Cholesterol 165 0 - 200 mg/dL   Triglycerides 126.0 0.0 - 149.0 mg/dL   HDL 35.70 (L) >39.00 mg/dL   VLDL 25.2 0.0 - 40.0 mg/dL   LDL Cholesterol 104 (H) 0 - 99 mg/dL   Total CHOL/HDL Ratio 5    NonHDL 129.10   Comprehensive metabolic panel  Result Value Ref Range   Sodium 141 135 - 145 mEq/L   Potassium 4.4 3.5 - 5.1 mEq/L   Chloride 105 96 - 112 mEq/L   CO2 30 19 - 32 mEq/L   Glucose, Bld 96 70 - 99 mg/dL   BUN 7 6 - 23 mg/dL   Creatinine, Ser 0.96 0.40 - 1.50 mg/dL   Total Bilirubin 0.6 0.2 - 1.2 mg/dL   Alkaline Phosphatase 100 39 - 117 U/L   AST 14 0 - 37 U/L   ALT 17 0 - 53 U/L   Total Protein 5.9 (L) 6.0 - 8.3 g/dL   Albumin 3.7 3.5 - 5.2 g/dL   Calcium 9.7 8.4 - 10.5 mg/dL   GFR 100.12 >60.00 mL/min  CBC with Differential/Platelet  Result Value Ref Range   WBC 5.2 4.0 - 10.5 K/uL   RBC 6.34 (H) 4.22 - 5.81 Mil/uL   Hemoglobin 14.6 13.0 - 17.0 g/dL   HCT 45.4 39.0 - 52.0 %   MCV 71.6 (L) 78.0 - 100.0 fl   MCHC 32.1 30.0 - 36.0 g/dL   RDW 21.4 (H) 11.5 - 15.5 %   Platelets 283.0 150.0 - 400.0 K/uL   Neutrophils Relative % 45.5 43.0 - 77.0 %   Lymphocytes Relative 36.4 12.0 - 46.0 %   Monocytes Relative 8.7 3.0 - 12.0 %   Eosinophils Relative 8.4 (H) 0.0 - 5.0 %   Basophils Relative 1.0 0.0 - 3.0 %   Neutro Abs 2.3 1.4 - 7.7 K/uL   Lymphs Abs 1.9 0.7 - 4.0 K/uL   Monocytes Absolute 0.4 0.1 - 1.0 K/uL   Eosinophils Absolute 0.4 0.0 - 0.7 K/uL   Basophils Absolute 0.1 0.0 - 0.1 K/uL  PSA, Medicare  Result Value Ref Range   PSA 1.18 0.10 - 4.00 ng/ml      Assessment & Plan:   Problem List Items Addressed This Visit    TOBACCO ABUSE    Continue to encourage cessation.      Headache - Primary    Headache with some  subjective L facial numbness but on exam no numbness appreciated. No other neurological symptoms today to indicate stroke. Anticipate early sinusitis - treat with ibuprofen and augmentin course. Offered head CT to help r/o stroke, eval for sinusitis - pt declines. Extensively discussed red flag symptoms to indicate emergent need for ER evaluation overnight. Pt has close f/u with me tomorrow am for CPE 8:30am. BP stable today.      Relevant Medications   ibuprofen (ADVIL,MOTRIN) 600 MG tablet  Follow up plan: No Follow-up on file.

## 2016-01-18 ENCOUNTER — Encounter: Payer: Self-pay | Admitting: Family Medicine

## 2016-01-18 ENCOUNTER — Ambulatory Visit (INDEPENDENT_AMBULATORY_CARE_PROVIDER_SITE_OTHER): Payer: Medicare Other | Admitting: Family Medicine

## 2016-01-18 VITALS — BP 130/72 | HR 80 | Temp 97.7°F | Wt 234.8 lb

## 2016-01-18 DIAGNOSIS — D563 Thalassemia minor: Secondary | ICD-10-CM

## 2016-01-18 DIAGNOSIS — H9193 Unspecified hearing loss, bilateral: Secondary | ICD-10-CM

## 2016-01-18 DIAGNOSIS — R519 Headache, unspecified: Secondary | ICD-10-CM

## 2016-01-18 DIAGNOSIS — E669 Obesity, unspecified: Secondary | ICD-10-CM

## 2016-01-18 DIAGNOSIS — Z7189 Other specified counseling: Secondary | ICD-10-CM

## 2016-01-18 DIAGNOSIS — R51 Headache: Secondary | ICD-10-CM

## 2016-01-18 DIAGNOSIS — Z0000 Encounter for general adult medical examination without abnormal findings: Secondary | ICD-10-CM

## 2016-01-18 DIAGNOSIS — E7800 Pure hypercholesterolemia, unspecified: Secondary | ICD-10-CM

## 2016-01-18 DIAGNOSIS — F17200 Nicotine dependence, unspecified, uncomplicated: Secondary | ICD-10-CM

## 2016-01-18 DIAGNOSIS — J449 Chronic obstructive pulmonary disease, unspecified: Secondary | ICD-10-CM

## 2016-01-18 DIAGNOSIS — I10 Essential (primary) hypertension: Secondary | ICD-10-CM

## 2016-01-18 DIAGNOSIS — K810 Acute cholecystitis: Secondary | ICD-10-CM

## 2016-01-18 MED ORDER — TIOTROPIUM BROMIDE MONOHYDRATE 18 MCG IN CAPS: 18.0000 ug | ORAL_CAPSULE | Freq: Every day | RESPIRATORY_TRACT | Status: DC

## 2016-01-18 NOTE — Assessment & Plan Note (Signed)
Chronic, stable. Continue current regimen. 

## 2016-01-18 NOTE — Progress Notes (Signed)
Pre visit review using our clinic review tool, if applicable. No additional management support is needed unless otherwise documented below in the visit note. 

## 2016-01-18 NOTE — Assessment & Plan Note (Signed)
Chronic, stable. Continue pravastatin. 

## 2016-01-18 NOTE — Patient Instructions (Addendum)
Continue flonase. Consider holding ibuprofen rest of day to see if headache returns. Continue antibiotic but for just 7 days. If any recurrent numbness or worsening headache, please let us know right away.  Colonoscopy in March Expect a call to schedule screening lung cancer CT scan. Good to see you today, call us with questions. Bring [place] copy of your living will. Good to see you today.   Health Maintenance, Male A healthy lifestyle and preventative care can promote health and wellness.  Maintain regular health, dental, and eye exams.  Eat a healthy diet. Foods like vegetables, fruits, whole grains, low-fat dairy products, and lean protein foods contain the nutrients you need and are low in calories. Decrease your intake of foods high in solid fats, added sugars, and salt. Get information about a proper diet from your health care provider, if necessary.  Regular physical exercise is one of the most important things you can do for your health. Most adults should get at least 150 minutes of moderate-intensity exercise (any activity that increases your heart rate and causes you to sweat) each week. In addition, most adults need muscle-strengthening exercises on 2 or more days a week.   Maintain a healthy weight. The body mass index (BMI) is a screening tool to identify possible weight problems. It provides an estimate of body fat based on height and weight. Your health care provider can find your BMI and can help you achieve or maintain a healthy weight. For males 20 years and older:  A BMI below 18.5 is considered underweight.  A BMI of 18.5 to 24.9 is normal.  A BMI of 25 to 29.9 is considered overweight.  A BMI of 30 and above is considered obese.  Maintain normal blood lipids and cholesterol by exercising and minimizing your intake of saturated fat. Eat a balanced diet with plenty of fruits and vegetables. Blood tests for lipids and cholesterol should begin at age 2 and be repeated every  5 years. If your lipid or cholesterol levels are high, you are over age 13, or you are at high risk for heart disease, you may need your cholesterol levels checked more frequently.Ongoing high lipid and cholesterol levels should be treated with medicines if diet and exercise are not working.  If you smoke, find out from your health care provider how to quit. If you do not use tobacco, do not start.  Lung cancer screening is recommended for adults aged 30-80 years who are at high risk for developing lung cancer because of a history of smoking. A yearly low-dose CT scan of the lungs is recommended for people who have at least a 30-pack-year history of smoking and are current smokers or have quit within the past 15 years. A pack year of smoking is smoking an average of 1 pack of cigarettes a day for 1 year (for example, a 30-pack-year history of smoking could mean smoking 1 pack a day for 30 years or 2 packs a day for 15 years). Yearly screening should continue until the smoker has stopped smoking for at least 15 years. Yearly screening should be stopped for people who develop a health problem that would prevent them from having lung cancer treatment.  If you choose to drink alcohol, do not have more than 2 drinks per day. One drink is considered to be 12 oz (360 mL) of beer, 5 oz (150 mL) of wine, or 1.5 oz (45 mL) of liquor.  Avoid the use of street drugs. Do not share  needles with anyone. Ask for help if you need support or instructions about stopping the use of drugs.  High blood pressure causes heart disease and increases the risk of stroke. High blood pressure is more likely to develop in:  People who have blood pressure in the end of the normal range (100-139/85-89 mm Hg).  People who are overweight or obese.  People who are African American.  If you are 99-75 years of age, have your blood pressure checked every 3-5 years. If you are 34 years of age or older, have your blood pressure checked  every year. You should have your blood pressure measured twice--once when you are at a hospital or clinic, and once when you are not at a hospital or clinic. Record the average of the two measurements. To check your blood pressure when you are not at a hospital or clinic, you can use:  An automated blood pressure machine at a pharmacy.  A home blood pressure monitor.  If you are 77-52 years old, ask your health care provider if you should take aspirin to prevent heart disease.  Diabetes screening involves taking a blood sample to check your fasting blood sugar level. This should be done once every 3 years after age 36 if you are at a normal weight and without risk factors for diabetes. Testing should be considered at a younger age or be carried out more frequently if you are overweight and have at least 1 risk factor for diabetes.  Colorectal cancer can be detected and often prevented. Most routine colorectal cancer screening begins at the age of 31 and continues through age 85. However, your health care provider may recommend screening at an earlier age if you have risk factors for colon cancer. On a yearly basis, your health care provider may provide home test kits to check for hidden blood in the stool. A small camera at the end of a tube may be used to directly examine the colon (sigmoidoscopy or colonoscopy) to detect the earliest forms of colorectal cancer. Talk to your health care provider about this at age 23 when routine screening begins. A direct exam of the colon should be repeated every 5-10 years through age 27, unless early forms of precancerous polyps or small growths are found.  People who are at an increased risk for hepatitis B should be screened for this virus. You are considered at high risk for hepatitis B if:  You were born in a country where hepatitis B occurs often. Talk with your health care provider about which countries are considered high risk.  Your parents were born in a  high-risk country and you have not received a shot to protect against hepatitis B (hepatitis B vaccine).  You have HIV or AIDS.  You use needles to inject street drugs.  You live with, or have sex with, someone who has hepatitis B.  You are a man who has sex with other men (MSM).  You get hemodialysis treatment.  You take certain medicines for conditions like cancer, organ transplantation, and autoimmune conditions.  Hepatitis C blood testing is recommended for all people born from [birth year] through 1964 and any individual with known risk factors for hepatitis C.  Healthy men should no longer receive prostate-specific antigen (PSA) blood tests as part of routine cancer screening. Talk to your health care provider about prostate cancer screening.  Testicular cancer screening is not recommended for adolescents or adult males who have no symptoms. Screening includes self-exam, a health care  provider exam, and other screening tests. Consult with your health care provider about any symptoms you have or any concerns you have about testicular cancer.  Practice safe sex. Use condoms and avoid high-risk sexual practices to reduce the spread of sexually transmitted infections (STIs).  You should be screened for STIs, including gonorrhea and chlamydia if:  You are sexually active and are younger than 24 years.  You are older than 24 years, and your health care provider tells you that you are at risk for this type of infection.  Your sexual activity has changed since you were last screened, and you are at an increased risk for chlamydia or gonorrhea. Ask your health care provider if you are at risk.  If you are at risk of being infected with HIV, it is recommended that you take a prescription medicine daily to prevent HIV infection. This is called pre-exposure prophylaxis (PrEP). You are considered at risk if:  You are a man who has sex with other men (MSM).  You are a heterosexual man who is  sexually active with multiple partners.  You take drugs by injection.  You are sexually active with a partner who has HIV.  Talk with your health care provider about whether you are at high risk of being infected with HIV. If you choose to begin PrEP, you should first be tested for HIV. You should then be tested every 3 months for as long as you are taking PrEP.  Use sunscreen. Apply sunscreen liberally and repeatedly throughout the day. You should seek shade when your shadow is shorter than you. Protect yourself by wearing long sleeves, pants, a wide-brimmed hat, and sunglasses year round whenever you are outdoors.  Tell your health care provider of new moles or changes in moles, especially if there is a change in shape or color. Also, tell your health care provider if a mole is larger than the size of a pencil eraser.  A one-time screening for abdominal aortic aneurysm (AAA) and surgical repair of large AAAs by ultrasound is recommended for men aged 73-75 years who are current or former smokers.  Stay current with your vaccines (immunizations).   This information is not intended to replace advice given to you by your health care provider. Make sure you discuss any questions you have with your health care provider.   Document Released: 05/25/2008 Document Revised: 12/18/2014 Document Reviewed: 04/24/2011 Elsevier Interactive Patient Education [employer].

## 2016-01-18 NOTE — Assessment & Plan Note (Signed)

## 2016-01-18 NOTE — Addendum Note (Signed)
Addended by: Ria Bush on: 01/18/2016 11:25 PM   Modules accepted: Orders, SmartSet

## 2016-01-18 NOTE — Assessment & Plan Note (Signed)
Preventative protocols reviewed and updated unless pt declined. Discussed healthy diet and lifestyle.  

## 2016-01-18 NOTE — Assessment & Plan Note (Signed)
Stable period.  

## 2016-01-18 NOTE — Assessment & Plan Note (Signed)
Continue spiriva. Continue to encourage smoking cessation.

## 2016-01-18 NOTE — Assessment & Plan Note (Signed)
Discussed healthy diet and lifestyle changes to affect sustainable weight loss. rec low sodium diet.

## 2016-01-18 NOTE — Assessment & Plan Note (Signed)
Healing well from last month's surgery.

## 2016-01-18 NOTE — Assessment & Plan Note (Addendum)
Presumed sinusitis. Improving with ibuprofen and augmentin and flonase. Continue current regimen, but suggested hold NSAID to see if HA returns - then low threshold to check CT given comorbidities.

## 2016-01-18 NOTE — Assessment & Plan Note (Signed)
Planned audiology f/u for hearing aides.

## 2016-01-18 NOTE — Assessment & Plan Note (Signed)
Continue to encourage cessation. Discussed lung cancer screening CT and pt interested - so will refer.

## 2016-01-18 NOTE — Assessment & Plan Note (Signed)
Advanced directives: has living will at home for himself and wife. HCPOA - son. Asked to bring copy 

## 2016-01-18 NOTE — Progress Notes (Signed)
BP 130/72 mmHg  Pulse 80  Temp(Src) 97.7 F (36.5 C) (Oral)  Wt 234 lb 12 oz (106.482 kg)   CC: medicare wellness visit  Subjective:    Patient ID: Dominic Turner, male    DOB: Mar 18, 1946, 70 y.o.   MRN: [id number]  HPI: Dominic Turner is a 70 y.o. male presenting on 01/18/2016 for Annual Exam   See yesterday's note for details. Seen here with 1d h/o headache, subjective facial numbness and left nasal congestion but nonfocal neurological exam. Treated for possible early sinusitis with augmentin + ibuprofen. Here today for wellness visit and close f/u. Headache has improved, numbness improving.  Smoking - 1/2 ppd. COPD - compliant with spiriva. Has tried nicorette gum. May consider nicoderm patch. Tried chantix - caused rash.   Recent cataract extraction (bilaterally) followed by emergent cholecystectomy 12/2015.   Vision screen - eye doctor appt Friday Hearing screen - planning on getting hearing aides this year No falls. No depression, anhedonia.   Preventative: COLONOSCOPY Date: 03/03/2013 tubular adenoma x3, diverticulosis, rpt 3 yrs (Dr. Cristina Gong). Has this scheduled. prostate - always normal, due today - checks yearly Lungs cancer screening - eligible. Interested in this.  Flu shot - yearly pneumovax - 20009, 2015. prevnar 2016 Td 2009  Shingles shot - 10/2013 Advanced directives: has living will at home for himself and wife. HCPOA - son. Asked to bring copy Seat belt use discussed No changing moles in skin.  Lives alone. Wife lives at [facility] in [place] in [place] (memory unit). Sees wife daily.  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 Interim medical history since our last visit reviewed. Allergies and medications reviewed and updated. Current Outpatient Prescriptions  on File Prior to Visit  Medication Sig  . amLODipine (NORVASC) 10 MG tablet TAKE 1 TABLET (10 MG TOTAL) BY MOUTH DAILY.  Marland Kitchen amoxicillin-clavulanate (AUGMENTIN) 875-125 MG tablet Take 1 tablet by mouth 2 (two) times daily.  Marland Kitchen aspirin 81 MG tablet Take 81 mg by mouth daily.    . clobetasol (TEMOVATE) 0.05 % external solution Apply 1 application topically.  . fluticasone (FLONASE) 50 MCG/ACT nasal spray Place 2 sprays into both nostrils daily.  Marland Kitchen ibuprofen (ADVIL,MOTRIN) 600 MG tablet Take 1 tablet (600 mg total) by mouth 3 (three) times daily as needed.  . Multiple Vitamin Essential TABS Take 1 tablet by mouth daily.    . pravastatin (PRAVACHOL) 40 MG tablet TAKE 1 TABLET (40 MG TOTAL) BY MOUTH DAILY.  Marland Kitchen tiotropium (SPIRIVA HANDIHALER) 18 MCG inhalation capsule Place 1 capsule (18 mcg total) into inhaler and inhale at bedtime.  . triamcinolone cream (KENALOG) 0.1 % Apply 1 application topically 2 (two) times daily.   No current facility-administered medications on file prior to visit.    Review of Systems  Constitutional: Negative for fever, chills, activity change, appetite change, fatigue and unexpected weight change.  HENT: Positive for congestion (mild). Negative for hearing loss.   Eyes: Negative for visual disturbance.  Respiratory: Negative for cough, chest tightness, shortness of breath and wheezing.   Cardiovascular: Negative for chest pain, palpitations and leg swelling.  Gastrointestinal: Negative for nausea, vomiting, abdominal pain, diarrhea, constipation, blood in stool and abdominal distention.  Genitourinary: Negative for hematuria and difficulty urinating.  Musculoskeletal: Negative for myalgias, arthralgias and neck pain.  Skin: Negative for rash.  Neurological: Negative  for dizziness, seizures, syncope and headaches.  Hematological: Negative for adenopathy. Does not bruise/bleed easily.  Psychiatric/Behavioral: Negative for dysphoric mood. The patient is not  nervous/anxious.    Per HPI unless specifically indicated in ROS section     Objective:    BP 130/72 mmHg  Pulse 80  Temp(Src) 97.7 F (36.5 C) (Oral)  Wt 234 lb 12 oz (106.482 kg)  Wt Readings from Last 3 Encounters:  01/18/16 234 lb 12 oz (106.482 kg)  01/17/16 234 lb 12 oz (106.482 kg)  01/10/16 237 lb (107.502 kg)   Body mass index is 34.65 kg/(m^2).  Physical Exam  Constitutional: He is oriented to person, place, and time. He appears well-developed and well-nourished. No distress.  HENT:  Head: Normocephalic and atraumatic.  Right Ear: Hearing, tympanic membrane, external ear and ear canal normal.  Left Ear: Hearing, tympanic membrane, external ear and ear canal normal.  Nose: Nose normal.  Mouth/Throat: Uvula is midline, oropharynx is clear and moist and mucous membranes are normal. No oropharyngeal exudate, posterior oropharyngeal edema or posterior oropharyngeal erythema.  Eyes: Conjunctivae and EOM are normal. Pupils are equal, round, and reactive to light. No scleral icterus.  Neck: Normal range of motion. Neck supple. Carotid bruit is not present. No thyromegaly present.  Cardiovascular: Normal rate, regular rhythm, normal heart sounds and intact distal pulses.   No murmur heard. Pulses:      Radial pulses are 2+ on the right side, and 2+ on the left side.  Pulmonary/Chest: Effort normal and breath sounds normal. No respiratory distress. He has no wheezes. He has no rales.  Abdominal: Soft. Bowel sounds are normal. He exhibits no distension and no mass. There is no tenderness. There is no rebound and no guarding.  Genitourinary: Rectum normal and prostate normal. Rectal exam shows no external hemorrhoid, no internal hemorrhoid, no fissure, no mass, no tenderness and anal tone normal. Prostate is not enlarged (15gm) and not tender.  Musculoskeletal: Normal range of motion. He exhibits no edema.  Lymphadenopathy:    He has no cervical adenopathy.  Neurological: He is  alert and oriented to person, place, and time. He has normal strength. No cranial nerve deficit or sensory deficit. He displays a negative Romberg sign. Coordination and gait normal.  CN 2-12 intact. Station and gait intact No dysdiadochokinesia Recall 3/3 Calculation 3/5 serial 3s  Skin: Skin is warm and dry. No rash noted.  Psychiatric: He has a normal mood and affect. His behavior is normal. Judgment and thought content normal.  Nursing note and vitals reviewed.  Results for orders placed or performed in visit on 01/14/16  Lipid panel  Result Value Ref Range   Cholesterol 165 0 - 200 mg/dL   Triglycerides 126.0 0.0 - 149.0 mg/dL   HDL 35.70 (L) >39.00 mg/dL   VLDL 25.2 0.0 - 40.0 mg/dL   LDL Cholesterol 104 (H) 0 - 99 mg/dL   Total CHOL/HDL Ratio 5    NonHDL 129.10   Comprehensive metabolic panel  Result Value Ref Range   Sodium 141 135 - 145 mEq/L   Potassium 4.4 3.5 - 5.1 mEq/L   Chloride 105 96 - 112 mEq/L   CO2 30 19 - 32 mEq/L   Glucose, Bld 96 70 - 99 mg/dL   BUN 7 6 - 23 mg/dL   Creatinine, Ser 0.96 0.40 - 1.50 mg/dL   Total Bilirubin 0.6 0.2 - 1.2 mg/dL   Alkaline Phosphatase 100 39 - 117 U/L   AST 14 0 -  37 U/L   ALT 17 0 - 53 U/L   Total Protein 5.9 (L) 6.0 - 8.3 g/dL   Albumin 3.7 3.5 - 5.2 g/dL   Calcium 9.7 8.4 - 10.5 mg/dL   GFR 100.12 >60.00 mL/min  CBC with Differential/Platelet  Result Value Ref Range   WBC 5.2 4.0 - 10.5 K/uL   RBC 6.34 (H) 4.22 - 5.81 Mil/uL   Hemoglobin 14.6 13.0 - 17.0 g/dL   HCT 45.4 39.0 - 52.0 %   MCV 71.6 (L) 78.0 - 100.0 fl   MCHC 32.1 30.0 - 36.0 g/dL   RDW 21.4 (H) 11.5 - 15.5 %   Platelets 283.0 150.0 - 400.0 K/uL   Neutrophils Relative % 45.5 43.0 - 77.0 %   Lymphocytes Relative 36.4 12.0 - 46.0 %   Monocytes Relative 8.7 3.0 - 12.0 %   Eosinophils Relative 8.4 (H) 0.0 - 5.0 %   Basophils Relative 1.0 0.0 - 3.0 %   Neutro Abs 2.3 1.4 - 7.7 K/uL   Lymphs Abs 1.9 0.7 - 4.0 K/uL   Monocytes Absolute 0.4 0.1 - 1.0  K/uL   Eosinophils Absolute 0.4 0.0 - 0.7 K/uL   Basophils Absolute 0.1 0.0 - 0.1 K/uL  PSA, Medicare  Result Value Ref Range   PSA 1.18 0.10 - 4.00 ng/ml      Assessment & Plan:   Problem List Items Addressed This Visit    TOBACCO ABUSE    Continue to encourage cessation. Discussed lung cancer screening CT and pt interested - so will refer.       Relevant Orders   Ambulatory Referral for Lung Cancer Scre   Obesity, Class I, BMI 30-34.9    Discussed healthy diet and lifestyle changes to affect sustainable weight loss. rec low sodium diet.      Medicare annual wellness visit, subsequent - Primary    I have personally reviewed the Medicare Annual Wellness questionnaire and have noted 1. The patient's medical and social history 2. Their use of alcohol, tobacco or illicit drugs 3. Their current medications and supplements 4. The patient's functional ability including ADL's, fall risks, home safety risks and hearing or visual impairment. Cognitive function has been assessed and addressed as indicated.  5. Diet and physical activity 6. Evidence for depression or mood disorders The patients weight, height, BMI have been recorded in the chart. I have made referrals, counseling and provided education to the patient based on review of the above and I have provided the pt with a written personalized care plan for preventive services. Provider list updated.. See scanned questionairre as needed for further documentation. Reviewed preventative protocols and updated unless pt declined.       HYPERTENSION, BENIGN ESSENTIAL    Chronic, stable. Continue current regimen.       HYPERCHOLESTEROLEMIA, PURE    Chronic, stable. Continue pravastatin.      Hearing loss    Planned audiology f/u for hearing aides.      Health maintenance examination    Preventative protocols reviewed and updated unless pt declined. Discussed healthy diet and lifestyle.       Headache    Presumed sinusitis.  Improving with ibuprofen and augmentin and flonase. Continue current regimen, but suggested hold NSAID to see if HA returns - then low threshold to check CT given comorbidities.      COPD, severe ([place])    Continue spiriva. Continue to encourage smoking cessation.      Beta thalassemia minor  Stable period.      Advanced care planning/counseling discussion    Advanced directives: has living will at home for himself and wife. HCPOA - son. Asked to bring copy      RESOLVED: Acute cholecystitis    Healing well from last month's surgery.          Follow up plan: Return in about 1 year (around 01/17/2017), or as needed, for medicare wellness visit.

## 2016-01-20 ENCOUNTER — Telehealth: Payer: Self-pay | Admitting: Family Medicine

## 2016-01-20 ENCOUNTER — Ambulatory Visit
Admission: RE | Admit: 2016-01-20 | Discharge: 2016-01-20 | Disposition: A | Discharge status: Home / Self Care | Payer: Medicare Other | Source: Ambulatory Visit | Attending: Family Medicine | Admitting: Family Medicine

## 2016-01-20 DIAGNOSIS — R208 Other disturbances of skin sensation: Secondary | ICD-10-CM | POA: Diagnosis not present

## 2016-01-20 DIAGNOSIS — G3189 Other specified degenerative diseases of nervous system: Secondary | ICD-10-CM | POA: Diagnosis not present

## 2016-01-20 DIAGNOSIS — R51 Headache: Secondary | ICD-10-CM | POA: Insufficient documentation

## 2016-01-20 DIAGNOSIS — R200 Anesthesia of skin: Secondary | ICD-10-CM

## 2016-01-20 DIAGNOSIS — R519 Headache, unspecified: Secondary | ICD-10-CM

## 2016-01-20 IMAGING — CT CT HEAD W/O CM
1 series · 16 of 29 positions shown, 20 images · non-contrast
Comparison: None.

CLINICAL DATA: Frontal headache and left facial numbness since
[DATE]

EXAM:
CT HEAD WITHOUT CONTRAST
TECHNIQUE: Contiguous axial images were obtained from the base of the skull
through the vertex without intravenous contrast.

[Series 2: head wo · axial · 0.43mm/px · z∈[-53,+77]mm · 16 of 29 slices shown, 20 images]
[im 2/29  brain]
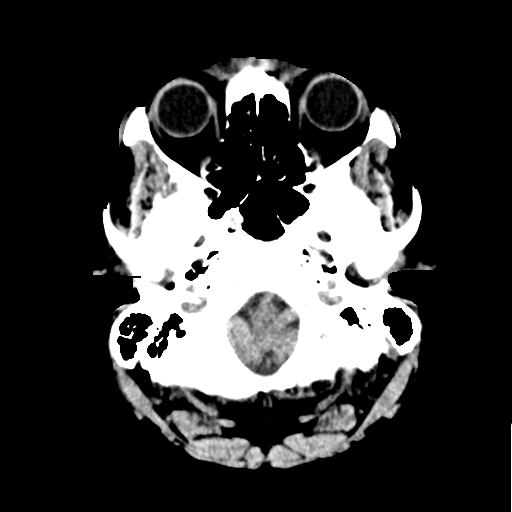
[im 2/29  bone]
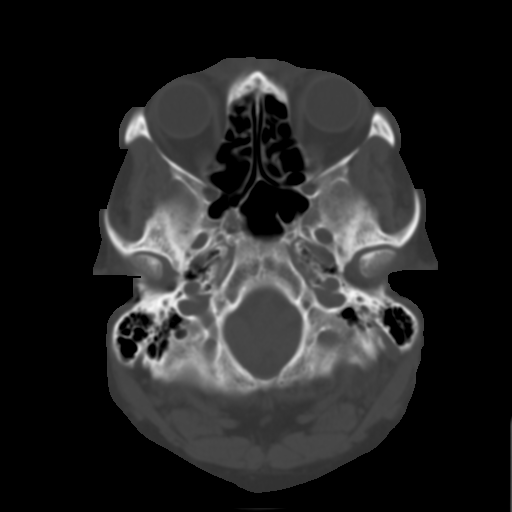
[im 4/29  brain]
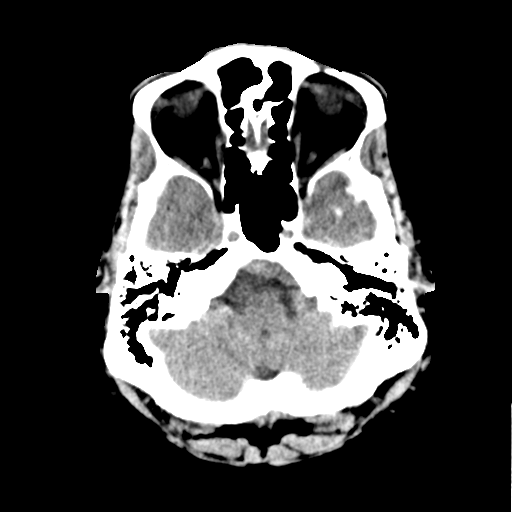
[im 6/29  brain]
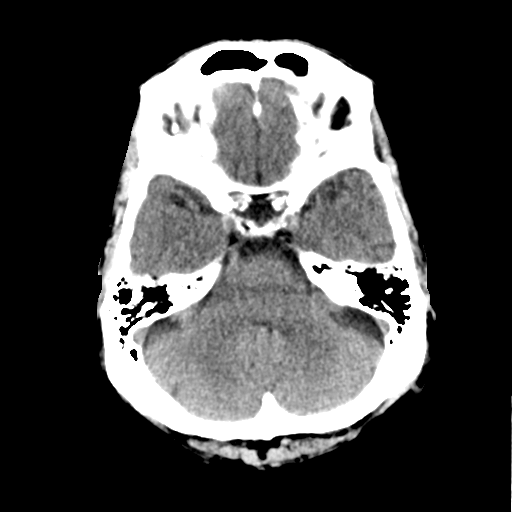
[im 7/29  brain]
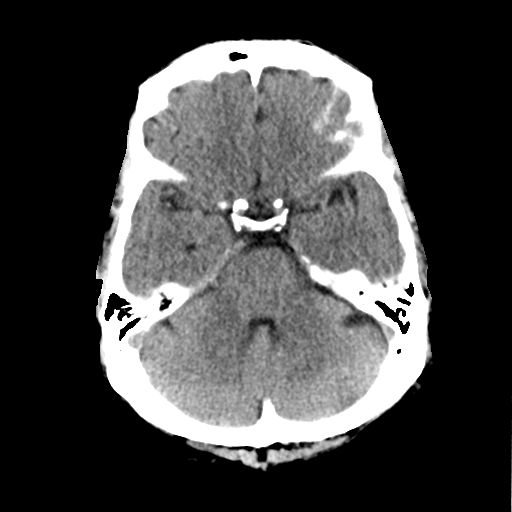
[im 9/29  brain]
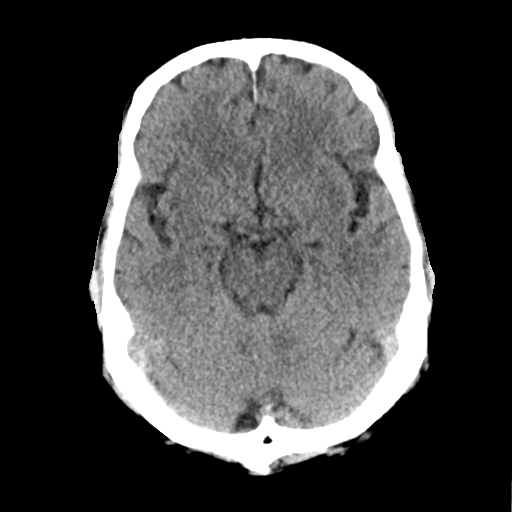
[im 9/29  bone]
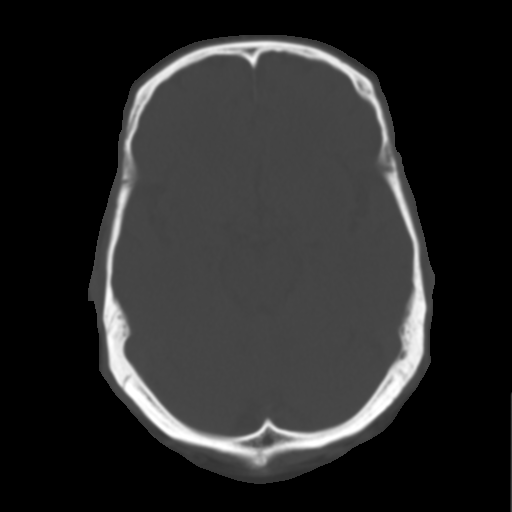
[im 11/29  brain]
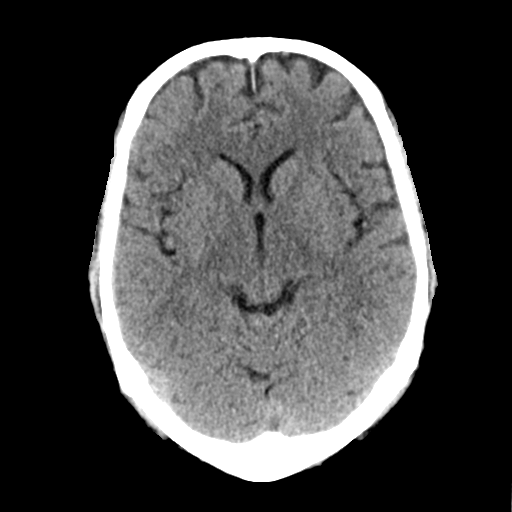
[im 12/29  brain]
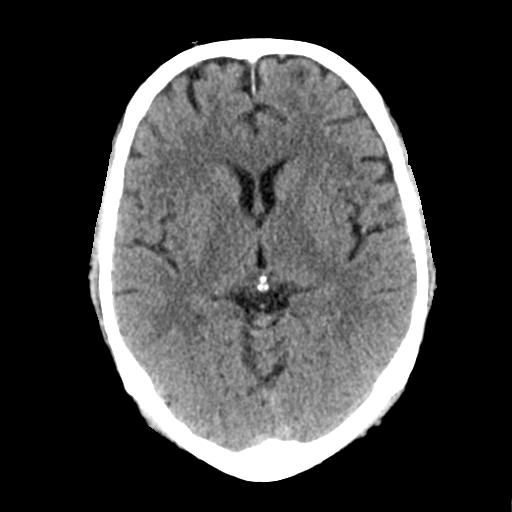
[im 14/29  brain]
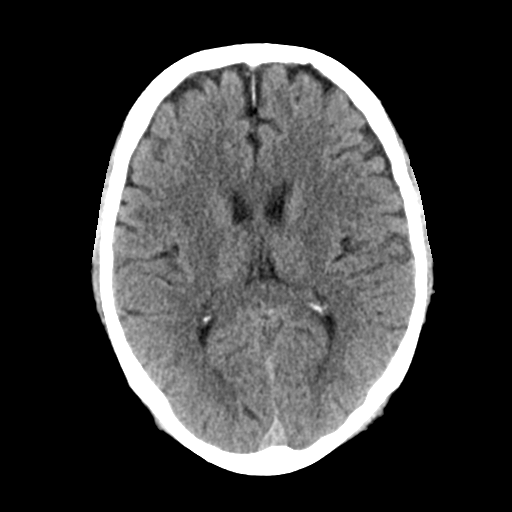
[im 16/29  brain]
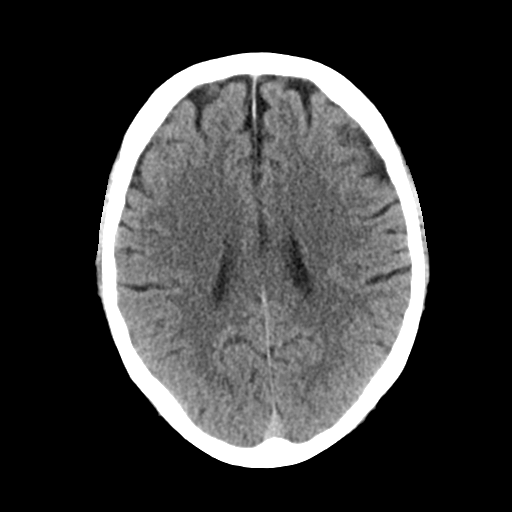
[im 16/29  bone]
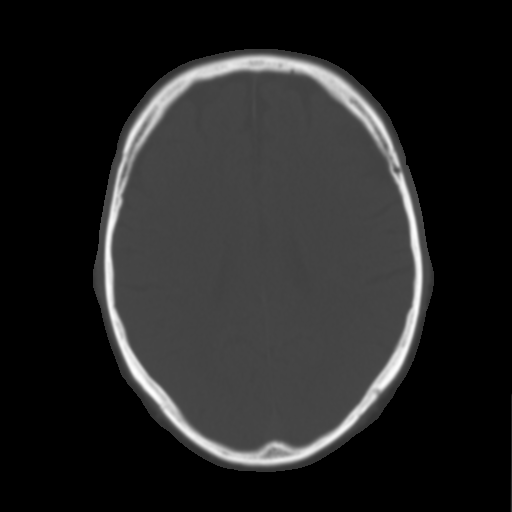
[im 18/29  brain]
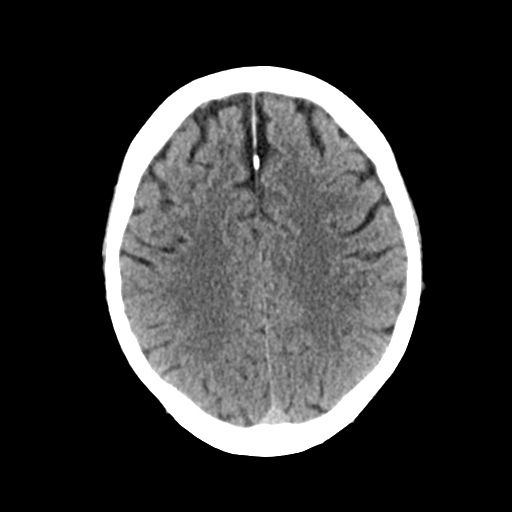
[im 19/29  brain]
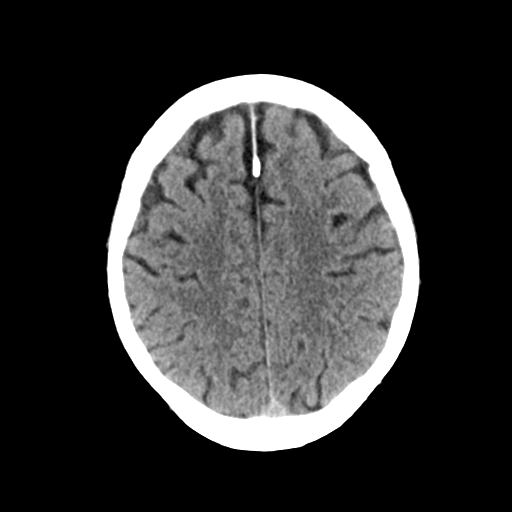
[im 21/29  brain]
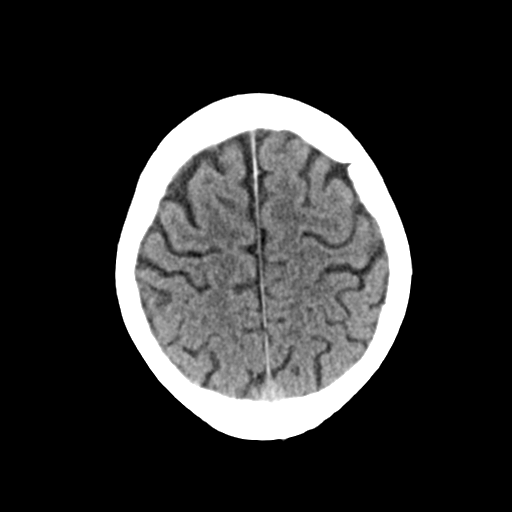
[im 23/29  brain]
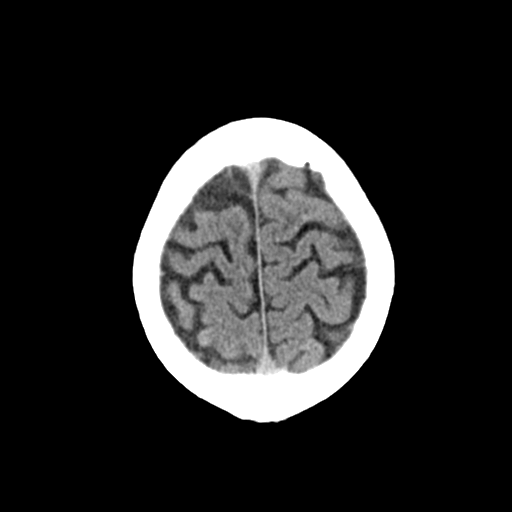
[im 23/29  bone]
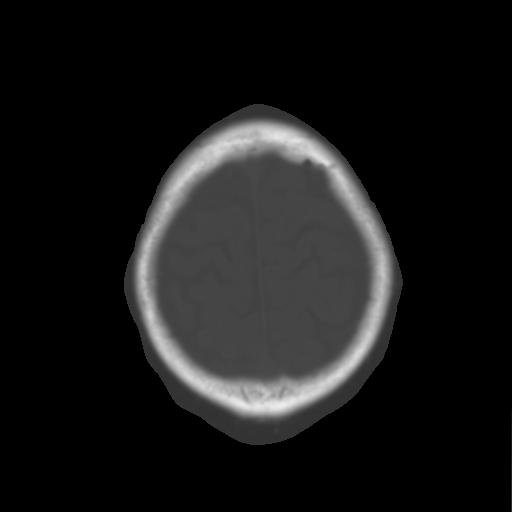
[im 24/29  brain]
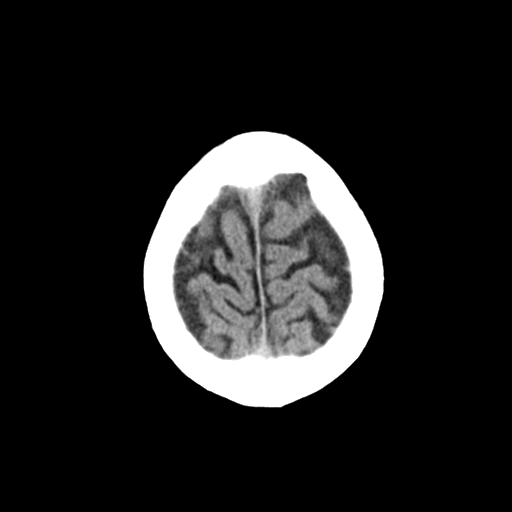
[im 26/29  brain]
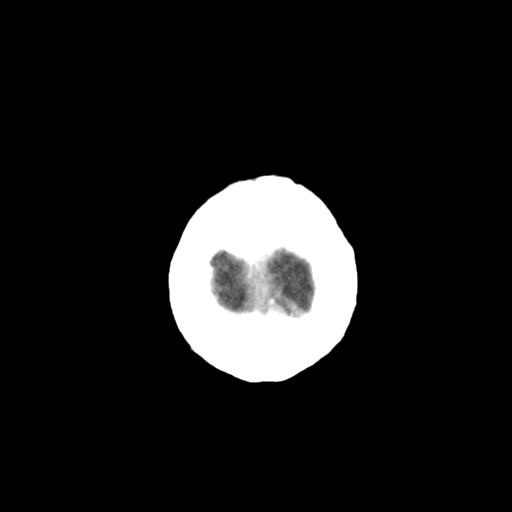
[im 28/29  brain]
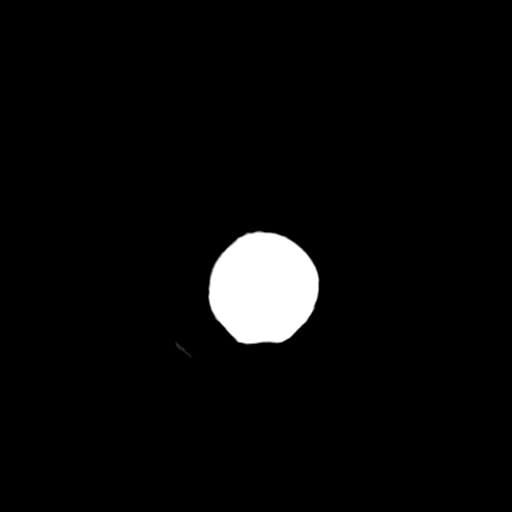

[16 of 29 positions shown; findings below may reference images not displayed]

FINDINGS: There is no midline shift, hydrocephalus, or mass. No acute
hemorrhage or acute transcortical infarct is identified. There is
chronic diffuse atrophy. The bony calvarium is intact. There is
minimal mucoperiosteal thickening of bilateral ethmoid sinuses.
IMPRESSION: No focal acute intracranial abnormality identified.

Mild mucoperiosteal thickening of bilateral ethmoid sinuses.

Chronic diffuse atrophy.

## 2016-01-20 NOTE — Telephone Encounter (Signed)
Patient said he would like to have a CT done.  The left side of his face is numb on and off.  Patient would like to go to [place]. Patient can go anytime except on Friday from 8:30-9:30.

## 2016-01-20 NOTE — Telephone Encounter (Signed)
CT scan ordered

## 2016-01-20 NOTE — Telephone Encounter (Signed)
Patient scheduled for todat at [facility], Call report requested and patient aware.

## 2016-01-21 ENCOUNTER — Encounter: Payer: Self-pay | Admitting: Acute Care

## 2016-01-21 ENCOUNTER — Telehealth: Payer: Self-pay | Admitting: Acute Care

## 2016-01-21 ENCOUNTER — Telehealth: Payer: Self-pay | Admitting: Family Medicine

## 2016-01-21 NOTE — Telephone Encounter (Signed)
Pt called wanting to know results of CT yesterday. Please advise  [phone] (cell)

## 2016-01-21 NOTE — Telephone Encounter (Signed)
Spoke to patient to make appointment.  Patient states he smokes 1/2ppd for 48 years. Patient stated he always has smoked 1/2ppd and no more. With this information patient is a 24 pack year smoker.  Patient does not qualify for this program. Requirements for the program is a 30 pack year smoker.  Patient was informed.

## 2016-01-21 NOTE — Telephone Encounter (Signed)
Pt wanted to know if it was serious and I read Dr. Bosie Clos result comment. Pt will finish 10 day course of abx and will let us know if after that he does not feel better. Pt does not need call back. Please add to result note that pt was informed. Thanks

## 2016-02-04 DIAGNOSIS — L218 Other seborrheic dermatitis: Secondary | ICD-10-CM | POA: Diagnosis not present

## 2016-03-09 ENCOUNTER — Other Ambulatory Visit: Payer: Self-pay | Admitting: Family Medicine

## 2016-05-11 DIAGNOSIS — D124 Benign neoplasm of descending colon: Secondary | ICD-10-CM | POA: Diagnosis not present

## 2016-05-11 DIAGNOSIS — Z86010 Personal history of colonic polyps: Secondary | ICD-10-CM | POA: Diagnosis not present

## 2016-05-11 DIAGNOSIS — D123 Benign neoplasm of transverse colon: Secondary | ICD-10-CM | POA: Diagnosis not present

## 2016-05-11 DIAGNOSIS — D125 Benign neoplasm of sigmoid colon: Secondary | ICD-10-CM | POA: Diagnosis not present

## 2016-05-11 DIAGNOSIS — K5730 Diverticulosis of large intestine without perforation or abscess without bleeding: Secondary | ICD-10-CM | POA: Diagnosis not present

## 2016-05-11 DIAGNOSIS — D126 Benign neoplasm of colon, unspecified: Secondary | ICD-10-CM | POA: Diagnosis not present

## 2016-05-11 HISTORY — PX: COLONOSCOPY: SHX174

## 2016-05-11 LAB — HM COLONOSCOPY

## 2016-06-18 ENCOUNTER — Encounter: Payer: Self-pay | Admitting: Family Medicine

## 2016-09-06 ENCOUNTER — Other Ambulatory Visit: Payer: Self-pay | Admitting: Family Medicine

## 2016-12-20 DIAGNOSIS — D485 Neoplasm of uncertain behavior of skin: Secondary | ICD-10-CM | POA: Diagnosis not present

## 2016-12-20 DIAGNOSIS — L219 Seborrheic dermatitis, unspecified: Secondary | ICD-10-CM | POA: Diagnosis not present

## 2016-12-20 DIAGNOSIS — L930 Discoid lupus erythematosus: Secondary | ICD-10-CM | POA: Diagnosis not present

## 2016-12-22 DIAGNOSIS — L308 Other specified dermatitis: Secondary | ICD-10-CM | POA: Diagnosis not present

## 2017-01-05 ENCOUNTER — Other Ambulatory Visit: Payer: Self-pay | Admitting: Family Medicine

## 2017-01-16 ENCOUNTER — Other Ambulatory Visit: Payer: Self-pay | Admitting: Family Medicine

## 2017-01-16 DIAGNOSIS — Z125 Encounter for screening for malignant neoplasm of prostate: Secondary | ICD-10-CM

## 2017-01-16 DIAGNOSIS — E7800 Pure hypercholesterolemia, unspecified: Secondary | ICD-10-CM

## 2017-01-16 DIAGNOSIS — D563 Thalassemia minor: Secondary | ICD-10-CM

## 2017-01-18 ENCOUNTER — Telehealth: Payer: Self-pay | Admitting: Family Medicine

## 2017-01-18 ENCOUNTER — Other Ambulatory Visit (INDEPENDENT_AMBULATORY_CARE_PROVIDER_SITE_OTHER): Payer: Medicare Other

## 2017-01-18 ENCOUNTER — Other Ambulatory Visit: Payer: Medicare Other

## 2017-01-18 DIAGNOSIS — E7800 Pure hypercholesterolemia, unspecified: Secondary | ICD-10-CM

## 2017-01-18 DIAGNOSIS — Z125 Encounter for screening for malignant neoplasm of prostate: Secondary | ICD-10-CM

## 2017-01-18 DIAGNOSIS — D563 Thalassemia minor: Secondary | ICD-10-CM | POA: Diagnosis not present

## 2017-01-18 LAB — LIPID PANEL
CHOL/HDL RATIO: 4
CHOLESTEROL: 195 mg/dL (ref 0–200)
HDL: 48 mg/dL (ref >39.00)
LDL Cholesterol: 131 mg/dL — ABNORMAL HIGH (ref 0–99)
NonHDL: 147.4
TRIGLYCERIDES: 80 mg/dL (ref 0.0–149.0)
VLDL: 16 mg/dL (ref 0.0–40.0)

## 2017-01-18 LAB — CBC WITH DIFFERENTIAL/PLATELET
BASOS ABS: 0.1 10*3/uL (ref 0.0–0.1)
Basophils Relative: 2.1 % (ref 0.0–3.0)
EOS ABS: 0.3 10*3/uL (ref 0.0–0.7)
Eosinophils Relative: 5 % (ref 0.0–5.0)
HEMATOCRIT: 47.6 % (ref 39.0–52.0)
Hemoglobin: 15.7 g/dL (ref 13.0–17.0)
LYMPHS ABS: 2.3 10*3/uL (ref 0.7–4.0)
LYMPHS PCT: 45.1 % (ref 12.0–46.0)
MCHC: 32.9 g/dL (ref 30.0–36.0)
MCV: 73.1 fl — ABNORMAL LOW (ref 78.0–100.0)
Monocytes Absolute: 0.5 10*3/uL (ref 0.1–1.0)
Monocytes Relative: 10 % (ref 3.0–12.0)
NEUTROS ABS: 2 10*3/uL (ref 1.4–7.7)
NEUTROS PCT: 37.8 % — AB (ref 43.0–77.0)
PLATELETS: 194 10*3/uL (ref 150.0–400.0)
RBC: 6.52 Mil/uL — ABNORMAL HIGH (ref 4.22–5.81)
RDW: 19.8 % — ABNORMAL HIGH (ref 11.5–15.5)
WBC: 5.2 10*3/uL (ref 4.0–10.5)

## 2017-01-18 LAB — BASIC METABOLIC PANEL
BUN: 10 mg/dL (ref 6–23)
CALCIUM: 9.6 mg/dL (ref 8.4–10.5)
CO2: 32 mEq/L (ref 19–32)
Chloride: 107 mEq/L (ref 96–112)
Creatinine, Ser: 0.99 mg/dL (ref 0.40–1.50)
GFR: 96.34 mL/min (ref >60.00)
GLUCOSE: 93 mg/dL (ref 70–99)
Potassium: 4.2 mEq/L (ref 3.5–5.1)
SODIUM: 141 meq/L (ref 135–145)

## 2017-01-18 LAB — PSA, MEDICARE: PSA: 0.89 ng/mL (ref 0.10–4.00)

## 2017-01-18 NOTE — Telephone Encounter (Signed)
Patient said he received a call.  He thought it might be about his lab results.

## 2017-01-18 NOTE — Telephone Encounter (Signed)
Disregard message. It was a reminder call.

## 2017-01-19 ENCOUNTER — Ambulatory Visit (INDEPENDENT_AMBULATORY_CARE_PROVIDER_SITE_OTHER): Payer: Medicare Other | Admitting: Family Medicine

## 2017-01-19 ENCOUNTER — Encounter: Payer: Self-pay | Admitting: Family Medicine

## 2017-01-19 DIAGNOSIS — E669 Obesity, unspecified: Secondary | ICD-10-CM

## 2017-01-19 DIAGNOSIS — E7800 Pure hypercholesterolemia, unspecified: Secondary | ICD-10-CM

## 2017-01-19 DIAGNOSIS — D563 Thalassemia minor: Secondary | ICD-10-CM

## 2017-01-19 DIAGNOSIS — Z7189 Other specified counseling: Secondary | ICD-10-CM

## 2017-01-19 DIAGNOSIS — F341 Dysthymic disorder: Secondary | ICD-10-CM

## 2017-01-19 DIAGNOSIS — I10 Essential (primary) hypertension: Secondary | ICD-10-CM

## 2017-01-19 DIAGNOSIS — Z0000 Encounter for general adult medical examination without abnormal findings: Secondary | ICD-10-CM

## 2017-01-19 DIAGNOSIS — F17200 Nicotine dependence, unspecified, uncomplicated: Secondary | ICD-10-CM

## 2017-01-19 DIAGNOSIS — J449 Chronic obstructive pulmonary disease, unspecified: Secondary | ICD-10-CM

## 2017-01-19 MED ORDER — AMLODIPINE BESYLATE 10 MG PO TABS: ORAL_TABLET | ORAL | 3 refills | Status: DC

## 2017-01-19 MED ORDER — PRAVASTATIN SODIUM 40 MG PO TABS: ORAL_TABLET | ORAL | 3 refills | Status: DC

## 2017-01-19 NOTE — Assessment & Plan Note (Signed)
Chronic, stable. Continue current regimen. 

## 2017-01-19 NOTE — Assessment & Plan Note (Signed)
Discussed healthy diet and lifestyle changes to affect sustainable weight loss  

## 2017-01-19 NOTE — Assessment & Plan Note (Signed)
Chronic, stable. Continue spiriva. No recent exacerbations.

## 2017-01-19 NOTE — Progress Notes (Signed)
Pre visit review using our clinic review tool, if applicable. No additional management support is needed unless otherwise documented below in the visit note. 

## 2017-01-19 NOTE — Progress Notes (Signed)
BP 126/72   Pulse 72   Temp 97.8 F (36.6 C) (Oral)   Ht 5\' 9"  (1.753 m)   Wt 236 lb 4 oz (107.2 kg)   BMI 34.89 kg/m    CC: CPE Subjective:    Patient ID: Dominic Turner, male    DOB: [birth year], 71 y.o.   MRN: [id number]  HPI: Dominic Turner is a 71 y.o. male presenting on 01/19/2017 for Annual Exam   Some ongoing hip pain.   Sees wife every day who lives at [place] - she is doing well.   Smoking - 1/2 ppd. COPD - compliant with spiriva. Has tried nicorette gum. May consider nicoderm patch. Tried chantix - caused rash.   Failed hearing screen Sees eye doctor yearly No falls in past year Denies depression  Preventative: COLONOSCOPY 05/2016; TA x3, diverticulosis, single angiodysplastic lesion, rpt 3 yrs (Buccini) Prostate - normal yearly - will space out Q2 yrs Lung cancer screening - referred last year, not eligible, endorsed 24 PY hx.  Flu shot yearly Pneumovax - 20009, 2015. prevnar 2016 Td 2009  Shingles shot - 10/2013 Advanced directives: has living will at home for himself and wife. HCPOA - son. Asked to bring copy Seat belt use discussed Sunscreen use discussed. No changing moles in skin. Smoker - 1/2 ppd Alcohol - none rec drugs - none  Lives alone. Wife lives at [facility] in [place] in [place] (memory unit). Sees wife daily.  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 Interim medical history since our last visit reviewed. Allergies and medications reviewed and updated. Current Outpatient Prescriptions on File Prior to Visit  Medication Sig  . amLODipine (NORVASC) 10 MG tablet TAKE 1 TABLET (10 MG TOTAL) BY MOUTH DAILY.  Marland Kitchen aspirin 81 MG tablet Take 81 mg by mouth daily.    . clobetasol (TEMOVATE) 0.05 % external solution Apply 1 application topically.  .  fluticasone (FLONASE) 50 MCG/ACT nasal spray PLACE 2 SPRAYS INTO BOTH NOSTRILS DAILY.  Marland Kitchen ibuprofen (ADVIL,MOTRIN) 600 MG tablet Take 1 tablet (600 mg total) by mouth 3 (three) times daily as needed.  . Multiple Vitamin Essential TABS Take 1 tablet by mouth daily.    . pravastatin (PRAVACHOL) 40 MG tablet TAKE 1 TABLET (40 MG TOTAL) BY MOUTH DAILY.  Marland Kitchen tiotropium (SPIRIVA HANDIHALER) 18 MCG inhalation capsule Place 1 capsule (18 mcg total) into inhaler and inhale at bedtime.  . triamcinolone cream (KENALOG) 0.1 % Apply 1 application topically 2 (two) times daily.   No current facility-administered medications on file prior to visit.     Review of Systems  Constitutional: Negative for activity change, appetite change, chills, fatigue, fever and unexpected weight change.  HENT: Negative for hearing loss.   Eyes: Negative for visual disturbance.  Respiratory: Negative for cough, chest tightness, shortness of breath and wheezing.   Cardiovascular: Negative for chest pain, palpitations and leg swelling.  Gastrointestinal: Positive for diarrhea (recent gi bug). Negative for abdominal distention, abdominal pain, blood in stool, constipation, nausea and vomiting.  Genitourinary: Negative for difficulty urinating and hematuria.  Musculoskeletal: Negative for arthralgias, myalgias and neck pain.  Skin: Negative for rash.  Neurological: Negative for dizziness, seizures, syncope and headaches.  Hematological: Negative for adenopathy. Does not bruise/bleed easily.  Psychiatric/Behavioral: Negative for dysphoric mood. The patient is not nervous/anxious.    Per  HPI unless specifically indicated in ROS section     Objective:    BP 126/72   Pulse 72   Temp 97.8 F (36.6 C) (Oral)   Ht 5\' 9"  (1.753 m)   Wt 236 lb 4 oz (107.2 kg)   BMI 34.89 kg/m   Wt Readings from Last 3 Encounters:  01/19/17 236 lb 4 oz (107.2 kg)  01/18/16 234 lb 12 oz (106.5 kg)  01/17/16 234 lb 12 oz (106.5 kg)    Physical  Exam  Constitutional: He is oriented to person, place, and time. He appears well-developed and well-nourished. No distress.  HENT:  Head: Normocephalic and atraumatic.  Right Ear: Hearing, tympanic membrane, external ear and ear canal normal.  Left Ear: Hearing, tympanic membrane, external ear and ear canal normal.  Nose: Nose normal.  Mouth/Throat: Uvula is midline, oropharynx is clear and moist and mucous membranes are normal. No oropharyngeal exudate, posterior oropharyngeal edema or posterior oropharyngeal erythema.  Eyes: Conjunctivae and EOM are normal. Pupils are equal, round, and reactive to light. No scleral icterus.  Neck: Normal range of motion. Neck supple. Carotid bruit is not present. No thyromegaly present.  Cardiovascular: Normal rate, regular rhythm, normal heart sounds and intact distal pulses.   No murmur heard. Pulses:      Radial pulses are 2+ on the right side, and 2+ on the left side.  Pulmonary/Chest: Effort normal and breath sounds normal. No respiratory distress. He has no wheezes. He has no rales.  Abdominal: Soft. Bowel sounds are normal. He exhibits no distension and no mass. There is no tenderness. There is no rebound and no guarding.  Genitourinary: Rectum normal and prostate normal. Rectal exam shows no external hemorrhoid, no internal hemorrhoid, no fissure, no mass, no tenderness and anal tone normal. Prostate is not enlarged (20gm) and not tender.  Musculoskeletal: Normal range of motion. He exhibits no edema.  Lymphadenopathy:    He has no cervical adenopathy.  Neurological: He is alert and oriented to person, place, and time.  CN grossly intact, station and gait intact Recall 3/3 Calculation 4/5 serial 3s  Skin: Skin is warm and dry. No rash noted.  Psychiatric: He has a normal mood and affect. His behavior is normal. Judgment and thought content normal.  Nursing note and vitals reviewed.  Results for orders placed or performed in visit on 01/18/17    Lipid panel  Result Value Ref Range   Cholesterol 195 0 - 200 mg/dL   Triglycerides 80.0 0.0 - 149.0 mg/dL   HDL 48.00 >39.00 mg/dL   VLDL 16.0 0.0 - 40.0 mg/dL   LDL Cholesterol 131 (H) 0 - 99 mg/dL   Total CHOL/HDL Ratio 4    NonHDL [id number]   Basic metabolic panel  Result Value Ref Range   Sodium 141 135 - 145 mEq/L   Potassium 4.2 3.5 - 5.1 mEq/L   Chloride 107 96 - 112 mEq/L   CO2 32 19 - 32 mEq/L   Glucose, Bld 93 70 - 99 mg/dL   BUN 10 6 - 23 mg/dL   Creatinine, Ser 0.99 0.40 - 1.50 mg/dL   Calcium 9.6 8.4 - 10.5 mg/dL   GFR 96.34 >60.00 mL/min  PSA, Medicare  Result Value Ref Range   PSA 0.89 0.10 - 4.00 ng/ml  CBC with Differential/Platelet  Result Value Ref Range   WBC 5.2 4.0 - 10.5 K/uL   RBC 6.52 (H) 4.22 - 5.81 Mil/uL   Hemoglobin 15.7 13.0 - 17.0 g/dL  HCT 47.6 39.0 - 52.0 %   MCV 73.1 (L) 78.0 - 100.0 fl   MCHC 32.9 30.0 - 36.0 g/dL   RDW 19.8 (H) 11.5 - 15.5 %   Platelets 194.0 150.0 - 400.0 K/uL   Neutrophils Relative % 37.8 (L) 43.0 - 77.0 %   Lymphocytes Relative 45.1 12.0 - 46.0 %   Monocytes Relative 10.0 3.0 - 12.0 %   Eosinophils Relative 5.0 0.0 - 5.0 %   Basophils Relative 2.1 0.0 - 3.0 %   Neutro Abs 2.0 1.4 - 7.7 K/uL   Lymphs Abs 2.3 0.7 - 4.0 K/uL   Monocytes Absolute 0.5 0.1 - 1.0 K/uL   Eosinophils Absolute 0.3 0.0 - 0.7 K/uL   Basophils Absolute 0.1 0.0 - 0.1 K/uL      Assessment & Plan:   Problem List Items Addressed This Visit    Advanced care planning/counseling discussion    Advanced directives: has living will at home for himself and wife. HCPOA - son. Asked to bring copy      RESOLVED: ANXIETY DEPRESSION    No trouble with this in years - will resolve.  H/o caregiver burnout.       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       COPD, severe (HCC)    Chronic, stable. Continue spiriva. No recent exacerbations.       Health maintenance examination    Preventative protocols reviewed and updated unless  pt declined. Discussed healthy diet and lifestyle.       HYPERCHOLESTEROLEMIA, PURE    Chronic, stable. Continue current regimen.       HYPERTENSION, BENIGN ESSENTIAL    Chronic, stable. Continue current regimen.       Medicare annual wellness visit, subsequent    I have personally reviewed the Medicare Annual Wellness questionnaire and have noted 1. The patient's medical and social history 2. Their use of alcohol, tobacco or illicit drugs 3. Their current medications and supplements 4. The patient's functional ability including ADL's, fall risks, home safety risks and hearing or visual impairment. Cognitive function has been assessed and addressed as indicated.  5. Diet and physical activity 6. Evidence for depression or mood disorders The patients weight, height, BMI have been recorded in the chart. I have made referrals, counseling and provided education to the patient based on review of the above and I have provided the pt with a written personalized care plan for preventive services. Provider list updated.. See scanned questionairre as needed for further documentation. Reviewed preventative protocols and updated unless pt declined.       Obesity, Class I, BMI 30-34.9    Discussed healthy diet and lifestyle changes to affect sustainable weight loss.       TOBACCO ABUSE    Continue to encourage cessation. Told not eligible for lung cancer screening.           Follow up plan: No Follow-up on file.  Ria Bush, MD

## 2017-01-19 NOTE — Assessment & Plan Note (Signed)
No trouble with this in years - will resolve.  H/o caregiver burnout.

## 2017-01-19 NOTE — Assessment & Plan Note (Signed)
Continue to encourage cessation. Told not eligible for lung cancer screening.

## 2017-01-19 NOTE — Assessment & Plan Note (Signed)
Advanced directives: has living will at home for himself and wife. HCPOA - son. Asked to bring copy

## 2017-01-19 NOTE — Assessment & Plan Note (Signed)
Preventative protocols reviewed and updated unless pt declined. Discussed healthy diet and lifestyle.  

## 2017-01-19 NOTE — Patient Instructions (Addendum)
Sign release for records from Dr Lupton's office Bring me copy of your living will.  You are doing well today. Return as needed or in 1 year for next physical.  Health Maintenance, Male A healthy lifestyle and preventative care can promote health and wellness.  Maintain regular health, dental, and eye exams.  Eat a healthy diet. Foods like vegetables, fruits, whole grains, low-fat dairy products, and lean protein foods contain the nutrients you need and are low in calories. Decrease your intake of foods high in solid fats, added sugars, and salt. Get information about a proper diet from your health care provider, if necessary.  Regular physical exercise is one of the most important things you can do for your health. Most adults should get at least 150 minutes of moderate-intensity exercise (any activity that increases your heart rate and causes you to sweat) each week. In addition, most adults need muscle-strengthening exercises on 2 or more days a week.   Maintain a healthy weight. The body mass index (BMI) is a screening tool to identify possible weight problems. It provides an estimate of body fat based on height and weight. Your health care provider can find your BMI and can help you achieve or maintain a healthy weight. For males 20 years and older:  A BMI below 18.5 is considered underweight.  A BMI of 18.5 to 24.9 is normal.  A BMI of 25 to 29.9 is considered overweight.  A BMI of 30 and above is considered obese.  Maintain normal blood lipids and cholesterol by exercising and minimizing your intake of saturated fat. Eat a balanced diet with plenty of fruits and vegetables. Blood tests for lipids and cholesterol should begin at age 72 and be repeated every 5 years. If your lipid or cholesterol levels are high, you are over age 13, or you are at high risk for heart disease, you may need your cholesterol levels checked more frequently.Ongoing high lipid and cholesterol levels should be  treated with medicines if diet and exercise are not working.  If you smoke, find out from your health care provider how to quit. If you do not use tobacco, do not start.  Lung cancer screening is recommended for adults aged 47-80 years who are at high risk for developing lung cancer because of a history of smoking. A yearly low-dose CT scan of the lungs is recommended for people who have at least a 30-pack-year history of smoking and are current smokers or have quit within the past 15 years. A pack year of smoking is smoking an average of 1 pack of cigarettes a day for 1 year (for example, a 30-pack-year history of smoking could mean smoking 1 pack a day for 30 years or 2 packs a day for 15 years). Yearly screening should continue until the smoker has stopped smoking for at least 15 years. Yearly screening should be stopped for people who develop a health problem that would prevent them from having lung cancer treatment.  If you choose to drink alcohol, do not have more than 2 drinks per day. One drink is considered to be 12 oz (360 mL) of beer, 5 oz (150 mL) of wine, or 1.5 oz (45 mL) of liquor.  Avoid the use of street drugs. Do not share needles with anyone. Ask for help if you need support or instructions about stopping the use of drugs.  High blood pressure causes heart disease and increases the risk of stroke. High blood pressure is more likely to  develop in:  People who have blood pressure in the end of the normal range (100-139/85-89 mm Hg).  People who are overweight or obese.  People who are African American.  If you are 59-52 years of age, have your blood pressure checked every 3-5 years. If you are 60 years of age or older, have your blood pressure checked every year. You should have your blood pressure measured twice-once when you are at a hospital or clinic, and once when you are not at a hospital or clinic. Record the average of the two measurements. To check your blood pressure when  you are not at a hospital or clinic, you can use:  An automated blood pressure machine at a pharmacy.  A home blood pressure monitor.  If you are 22-24 years old, ask your health care provider if you should take aspirin to prevent heart disease.  Diabetes screening involves taking a blood sample to check your fasting blood sugar level. This should be done once every 3 years after age 33 if you are at a normal weight and without risk factors for diabetes. Testing should be considered at a younger age or be carried out more frequently if you are overweight and have at least 1 risk factor for diabetes.  Colorectal cancer can be detected and often prevented. Most routine colorectal cancer screening begins at the age of 40 and continues through age 76. However, your health care provider may recommend screening at an earlier age if you have risk factors for colon cancer. On a yearly basis, your health care provider may provide home test kits to check for hidden blood in the stool. A small camera at the end of a tube may be used to directly examine the colon (sigmoidoscopy or colonoscopy) to detect the earliest forms of colorectal cancer. Talk to your health care provider about this at age 32 when routine screening begins. A direct exam of the colon should be repeated every 5-10 years through age 57, unless early forms of precancerous polyps or small growths are found.  People who are at an increased risk for hepatitis B should be screened for this virus. You are considered at high risk for hepatitis B if:  You were born in a country where hepatitis B occurs often. Talk with your health care provider about which countries are considered high risk.  Your parents were born in a high-risk country and you have not received a shot to protect against hepatitis B (hepatitis B vaccine).  You have HIV or AIDS.  You use needles to inject street drugs.  You live with, or have sex with, someone who has hepatitis  B.  You are a man who has sex with other men (MSM).  You get hemodialysis treatment.  You take certain medicines for conditions like cancer, organ transplantation, and autoimmune conditions.  Hepatitis C blood testing is recommended for all people born from [birth year] through 1964 and any individual with known risk factors for hepatitis C.  Healthy men should no longer receive prostate-specific antigen (PSA) blood tests as part of routine cancer screening. Talk to your health care provider about prostate cancer screening.  Testicular cancer screening is not recommended for adolescents or adult males who have no symptoms. Screening includes self-exam, a health care provider exam, and other screening tests. Consult with your health care provider about any symptoms you have or any concerns you have about testicular cancer.  Practice safe sex. Use condoms and avoid high-risk sexual practices to reduce  the spread of sexually transmitted infections (STIs).  You should be screened for STIs, including gonorrhea and chlamydia if:  You are sexually active and are younger than 24 years.  You are older than 24 years, and your health care provider tells you that you are at risk for this type of infection.  Your sexual activity has changed since you were last screened, and you are at an increased risk for chlamydia or gonorrhea. Ask your health care provider if you are at risk.  If you are at risk of being infected with HIV, it is recommended that you take a prescription medicine daily to prevent HIV infection. This is called pre-exposure prophylaxis (PrEP). You are considered at risk if:  You are a man who has sex with other men (MSM).  You are a heterosexual man who is sexually active with multiple partners.  You take drugs by injection.  You are sexually active with a partner who has HIV.  Talk with your health care provider about whether you are at high risk of being infected with HIV. If you  choose to begin PrEP, you should first be tested for HIV. You should then be tested every 3 months for as long as you are taking PrEP.  Use sunscreen. Apply sunscreen liberally and repeatedly throughout the day. You should seek shade when your shadow is shorter than you. Protect yourself by wearing long sleeves, pants, a wide-brimmed hat, and sunglasses year round whenever you are outdoors.  Tell your health care provider of new moles or changes in moles, especially if there is a change in shape or color. Also, tell your health care provider if a mole is larger than the size of a pencil eraser.  A one-time screening for abdominal aortic aneurysm (AAA) and surgical repair of large AAAs by ultrasound is recommended for men aged 58-75 years who are current or former smokers.  Stay current with your vaccines (immunizations). This information is not intended to replace advice given to you by your health care provider. Make sure you discuss any questions you have with your health care provider. Document Released: 05/25/2008 Document Revised: 12/18/2014 Document Reviewed: 08/31/2015 Elsevier Interactive Patient Education  2017 [employer].

## 2017-01-19 NOTE — Assessment & Plan Note (Signed)
Stable period. Presumed dx.

## 2017-01-19 NOTE — Assessment & Plan Note (Signed)

## 2017-01-20 ENCOUNTER — Encounter: Payer: Self-pay | Admitting: Family Medicine

## 2017-01-20 DIAGNOSIS — L219 Seborrheic dermatitis, unspecified: Secondary | ICD-10-CM | POA: Insufficient documentation

## 2017-01-20 DIAGNOSIS — L930 Discoid lupus erythematosus: Secondary | ICD-10-CM | POA: Insufficient documentation

## 2017-02-01 DIAGNOSIS — L0291 Cutaneous abscess, unspecified: Secondary | ICD-10-CM | POA: Diagnosis not present

## 2017-02-26 ENCOUNTER — Encounter: Payer: Self-pay | Admitting: Family Medicine

## 2017-02-26 ENCOUNTER — Ambulatory Visit (INDEPENDENT_AMBULATORY_CARE_PROVIDER_SITE_OTHER): Payer: Medicare Other | Admitting: Family Medicine

## 2017-02-26 VITALS — BP 136/70 | HR 89 | Temp 98.3°F | Wt 236.0 lb

## 2017-02-26 DIAGNOSIS — J0110 Acute frontal sinusitis, unspecified: Secondary | ICD-10-CM

## 2017-02-26 DIAGNOSIS — H5203 Hypermetropia, bilateral: Secondary | ICD-10-CM | POA: Diagnosis not present

## 2017-02-26 DIAGNOSIS — H11153 Pinguecula, bilateral: Secondary | ICD-10-CM | POA: Diagnosis not present

## 2017-02-26 MED ORDER — PROMETHAZINE-DM 6.25-15 MG/5ML PO SYRP: 5.0000 mL | ORAL_SOLUTION | Freq: Four times a day (QID) | ORAL | 0 refills | Status: DC | PRN

## 2017-02-26 MED ORDER — AMOXICILLIN-POT CLAVULANATE 875-125 MG PO TABS
1.0000 | ORAL_TABLET | Freq: Two times a day (BID) | ORAL | 0 refills | Status: AC
Start: 2017-02-26 — End: 2017-03-12

## 2017-02-26 NOTE — Patient Instructions (Signed)
Great to see you.  Take antibiotic (Augmentin) as directed.  Drink lots of fluids.    Treat sympotmatically with Mucinex, nasal saline irrigation, and Tylenol/Ibuprofen.   You can use warm compresses.  Cough suppressant at night.   Call if not improving as expected in 5-7 days.

## 2017-02-26 NOTE — Progress Notes (Signed)
Pre visit review using our clinic review tool, if applicable. No additional management support is needed unless otherwise documented below in the visit note. 

## 2017-02-26 NOTE — Progress Notes (Signed)
SUBJECTIVE:  Dominic Turner is a 71 y.o. male -pt of Dr. Darnell Level, new to me, who complains of coryza, congestion, dry cough and bilateral sinus pain for 7 days. He denies a history of anorexia and chest pain and denies a history of asthma. Patient admits to smoke cigarettes.   Taking flonase and mucinex with only mild improvement of symptoms.  Current Outpatient Prescriptions on File Prior to Visit  Medication Sig Dispense Refill  . amLODipine (NORVASC) 10 MG tablet TAKE 1 TABLET (10 MG TOTAL) BY MOUTH DAILY. 90 tablet 3  . aspirin 81 MG tablet Take 81 mg by mouth daily.      . clobetasol (TEMOVATE) 0.05 % external solution Apply 1 application topically.    . fluticasone (FLONASE) 50 MCG/ACT nasal spray PLACE 2 SPRAYS INTO BOTH NOSTRILS DAILY. 16 g 6  . ibuprofen (ADVIL,MOTRIN) 600 MG tablet Take 1 tablet (600 mg total) by mouth 3 (three) times daily as needed. 20 tablet 0  . Multiple Vitamin Essential TABS Take 1 tablet by mouth daily.      . pravastatin (PRAVACHOL) 40 MG tablet TAKE 1 TABLET (40 MG TOTAL) BY MOUTH DAILY. 90 tablet 3  . tiotropium (SPIRIVA HANDIHALER) 18 MCG inhalation capsule Place 1 capsule (18 mcg total) into inhaler and inhale at bedtime. 90 capsule 3  . triamcinolone cream (KENALOG) 0.1 % Apply 1 application topically 2 (two) times daily. 30 g 0   No current facility-administered medications on file prior to visit.     Allergies  Allergen Reactions  . Aspirin Other (See Comments)    REACTION: gi upset; baby ASA ok  . Sertraline Hcl Other (See Comments)    REACTION: sexual dysfunction, etc  . Sulfonamide Derivatives Other (See Comments)    Reaction: unknown  . Wellbutrin [Bupropion] Rash    Face rash and difficulty sleeping    Past Medical History:  Diagnosis Date  . Acute cholecystitis 12/2015  . ANXIETY DEPRESSION 10/23/2008  . 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 Bilateral high frequency sensorineural hearing  loss    rec hearing aides by ENT  . COPD, severe ([place]) 03/2014   Moderately severe obstruction, with low vital capacity. Post bronchodilator test not improved.  . Diverticulosis 02/2013   by colonoscopy  . Essential hypertension, benign   . History of adenomatous polyp of colon 02/2013   rec rpt 3 yrs  . Overweight(278.02)   . 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 Pure hypercholesterolemia   . Tobacco use disorder   . Unspecified gastritis and gastroduodenitis without mention of hemorrhage   . Unspecified sleep apnea     Past Surgical History:  Procedure Laterality Date  . CATARACT EXTRACTION, BILATERAL Bilateral   . CHOLECYSTECTOMY N/A 12/30/2015   Procedure: LAPAROSCOPIC CHOLECYSTECTOMY;  Surgeon: Florene Glen, MD;  Location: [facility] ORS;  Service: General;  Laterality: N/A;  . COLONOSCOPY  03/03/2013   tubular adenoma x3, diverticulosis, rpt 3 yrs (Dr. Cristina Gong)  . COLONOSCOPY  05/2016   TA x3, diverticulosis, single angiodysplastic lesion, rpt 3 yrs (Buccini)    Family History  Problem Relation Age of Onset  . Cirrhosis Father     died in 40's  . Hypertension Mother   . Heart attack Sister   . Stroke Sister   . Heart attack Maternal Grandmother   . Stroke Maternal Grandmother   . Cancer Neg Hx   . Thyroid disease Sister   .  Diabetes Sister     Social History   Social History  . Marital status: Married    Spouse name: N/A  . Number of children: 2  . Years of education: N/A   Occupational History  . truck driver    Social History Main Topics  . Smoking status: Current Every Day Smoker    Packs/day: 0.50    Years: 48.00    Types: Cigarettes    Start date: 12/12/1983  . Smokeless tobacco: Never Used  . Alcohol use No  . Drug use: No  . Sexual activity: Not on file   Other Topics Concern  . Not on file   Social History Narrative   Lives alone. Wife lives at [facility] in [place] in [place] (memory unit). Sees wife daily.    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  OBJECTIVE: BP 136/70 (BP Location: Left Arm, Patient Position: Sitting, Cuff Size: Large)   Pulse 89   Temp 98.3 F (36.8 C) (Oral)   Wt 236 lb (107 kg)   SpO2 96%   BMI 34.85 kg/m   He appears well, vital signs are as noted. Ears normal.  Throat and pharynx normal.  Neck supple. No adenopathy in the neck. Nose is congested. Sinuses tender. The chest is clear, without wheezes or rales.  ASSESSMENT:  sinusitis  PLAN: Symptomatic therapy suggested: push fluids, rest and return office visit prn if symptoms persist or worsen.Call or return to clinic prn if these symptoms worsen or fail to improve as anticipated.

## 2017-04-30 ENCOUNTER — Ambulatory Visit (INDEPENDENT_AMBULATORY_CARE_PROVIDER_SITE_OTHER): Payer: Medicare Other | Admitting: Family Medicine

## 2017-04-30 ENCOUNTER — Encounter: Payer: Self-pay | Admitting: Family Medicine

## 2017-04-30 VITALS — BP 110/74 | HR 78 | Temp 97.9°F | Wt 237.0 lb

## 2017-04-30 DIAGNOSIS — R351 Nocturia: Secondary | ICD-10-CM | POA: Diagnosis not present

## 2017-04-30 DIAGNOSIS — R809 Proteinuria, unspecified: Secondary | ICD-10-CM

## 2017-04-30 DIAGNOSIS — M545 Low back pain, unspecified: Secondary | ICD-10-CM | POA: Insufficient documentation

## 2017-04-30 LAB — POC URINALSYSI DIPSTICK (AUTOMATED)
Bilirubin, UA: NEGATIVE
Blood, UA: NEGATIVE
GLUCOSE UA: NEGATIVE
KETONES UA: NEGATIVE
Nitrite, UA: NEGATIVE
Protein, UA: NEGATIVE
SPEC GRAV UA: 1.025 (ref 1.010–1.025)
Urobilinogen, UA: 1 E.U./dL
pH, UA: 7 (ref 5.0–8.0)

## 2017-04-30 LAB — MICROALBUMIN / CREATININE URINE RATIO
CREATININE, U: 212.5 mg/dL
MICROALB UR: 0.8 mg/dL (ref 0.0–1.9)
MICROALB/CREAT RATIO: 0.4 mg/g (ref 0.0–30.0)

## 2017-04-30 MED ORDER — TAMSULOSIN HCL 0.4 MG PO CAPS
0.4000 mg | ORAL_CAPSULE | Freq: Every day | ORAL | 3 refills | Status: DC
Start: 2017-04-30 — End: 2017-05-28

## 2017-04-30 NOTE — Assessment & Plan Note (Signed)
?  BPH related. UA with 1+ LE but micro reassuring.  Rx flomax nightly.  Update if not improving.

## 2017-04-30 NOTE — Progress Notes (Addendum)
BP 110/74 (BP Location: Left Arm, Patient Position: Sitting, Cuff Size: Large)   Pulse 78   Temp 97.9 F (36.6 C) (Oral)   Wt 237 lb (107.5 kg)   SpO2 97%   BMI 35.00 kg/m    CC: back pain Subjective:    Patient ID: Dominic Turner, male    DOB: 14-Jul-1946, 71 y.o.   MRN: [id number]  HPI: Dominic Turner is a 71 y.o. male presenting on 04/30/2017 for Back Pain (Started about a week ago. Having alot of Nocturia. Home Nurse did a UA 2-3 weeks ago and said he had protein in his urine.)   1 wk h/o lower lumbar pain with some pain down L leg - associated am stiffness. Endorses increasing nocturia - 3-4 times a night. This has also increased over the past week. Mild odor to urine, urgency. No dysuria, frequency, hematuria, fevers/chills, nausea/vomiting, flank pain.  Denies inciting trauma/injury.  No numbness/weakness of legs.   Saw home nurse a few weeks ago - told he had protein in his urine.   Relevant past medical, surgical, family and social history reviewed and updated as indicated. Interim medical history since our last visit reviewed. Allergies and medications reviewed and updated. Outpatient Medications Prior to Visit  Medication Sig Dispense Refill  . amLODipine (NORVASC) 10 MG tablet TAKE 1 TABLET (10 MG TOTAL) BY MOUTH DAILY. 90 tablet 3  . aspirin 81 MG tablet Take 81 mg by mouth daily.      . clobetasol (TEMOVATE) 0.05 % external solution Apply 1 application topically.    . fluticasone (FLONASE) 50 MCG/ACT nasal spray PLACE 2 SPRAYS INTO BOTH NOSTRILS DAILY. 16 g 6  . ibuprofen (ADVIL,MOTRIN) 600 MG tablet Take 1 tablet (600 mg total) by mouth 3 (three) times daily as needed. (Patient taking differently: Take 600 mg by mouth daily as needed. ) 20 tablet 0  . Multiple Vitamin Essential TABS Take 1 tablet by mouth daily.      . pravastatin (PRAVACHOL) 40 MG tablet TAKE 1 TABLET (40 MG TOTAL) BY MOUTH DAILY. 90 tablet 3  . tiotropium (SPIRIVA HANDIHALER) 18 MCG inhalation  capsule Place 1 capsule (18 mcg total) into inhaler and inhale at bedtime. 90 capsule 3  . triamcinolone cream (KENALOG) 0.1 % Apply 1 application topically 2 (two) times daily. 30 g 0  . promethazine-dextromethorphan (PROMETHAZINE-DM) 6.25-15 MG/5ML syrup Take 5 mLs by mouth 4 (four) times daily as needed. 118 mL 0   No facility-administered medications prior to visit.      Per HPI unless specifically indicated in ROS section below Review of Systems     Objective:    BP 110/74 (BP Location: Left Arm, Patient Position: Sitting, Cuff Size: Large)   Pulse 78   Temp 97.9 F (36.6 C) (Oral)   Wt 237 lb (107.5 kg)   SpO2 97%   BMI 35.00 kg/m   Wt Readings from Last 3 Encounters:  04/30/17 237 lb (107.5 kg)  02/26/17 236 lb (107 kg)  01/19/17 236 lb 4 oz (107.2 kg)    Physical Exam  Constitutional: He appears well-developed and well-nourished. No distress.  Cardiovascular: Normal rate, normal heart sounds and intact distal pulses.   No murmur heard. Pulmonary/Chest: Effort normal and breath sounds normal. No respiratory distress. He has no wheezes. He has no rales.  Abdominal: Soft. Normal appearance and bowel sounds are normal. He exhibits no distension and no mass. There is no hepatosplenomegaly. There is no tenderness. There is no rigidity, no  rebound, no guarding, no CVA tenderness and negative Murphy's sign.  Musculoskeletal: He exhibits no edema.  Discomfort mid lumbar midline spine L lumbar paraspinous mm tenderness Neg SLR bilaterally (left side with pain isolated to L lower back. No pain with int/ext rotation at hip.  Skin: Skin is warm and dry. No rash noted.  Nursing note and vitals reviewed.  Results for orders placed or performed in visit on 04/30/17  POCT Urinalysis Dipstick (Automated)  Result Value Ref Range   Color, UA Yellow    Clarity, UA clear    Glucose, UA negative    Bilirubin, UA negative    Ketones, UA negative    Spec Grav, UA 1.025 1.010 - 1.025    Blood, UA negative    pH, UA 7.0 5.0 - 8.0   Protein, UA negative    Urobilinogen, UA 1.0 0.2 or 1.0 E.U./dL   Nitrite, UA negative    Leukocytes, UA Small (1+) (A) Negative   Micro: WBC 1-5 RBC rare Bact tr Casts none Epi rare UCx not sent    Assessment & Plan:   Problem List Items Addressed This Visit    Lower back pain - Primary    Anticipate lumbar strain. Supportive care reviewed.  Rx ibuprofen PRN, stretching exercises provided today.  Update if not improving with treatment.       Relevant Medications   acetaminophen (TYLENOL) 500 MG tablet   Other Relevant Orders   POCT Urinalysis Dipstick (Automated) (Completed)   Nocturia    ?BPH related. UA with 1+ LE but micro reassuring.  Rx flomax nightly.  Update if not improving.       Relevant Orders   POCT Urinalysis Dipstick (Automated) (Completed)    Other Visit Diagnoses    Proteinuria, unspecified type       Relevant Orders   Microalbumin / creatinine urine ratio       Follow up plan: Return if symptoms worsen or fail to improve.  Ria Bush, MD

## 2017-04-30 NOTE — Patient Instructions (Signed)
For back pain - I think you have lumbar strain. Treat with ibuprofen 600mg  as needed, do stretching exercises provided today, may use ice or heating pad as needed. We will recheck protein in the urine today as well.  For night time awakenings - start flomax for possible prostate enlargement.

## 2017-04-30 NOTE — Assessment & Plan Note (Signed)
Anticipate lumbar strain. Supportive care reviewed.  Rx ibuprofen PRN, stretching exercises provided today.  Update if not improving with treatment.

## 2017-05-01 ENCOUNTER — Telehealth: Payer: Self-pay | Admitting: Family Medicine

## 2017-05-01 NOTE — Telephone Encounter (Signed)
Patient returned Direce's call.

## 2017-05-02 NOTE — Telephone Encounter (Signed)
Noted on lab report that Dominic Turner notified pt of results.

## 2017-05-02 NOTE — Telephone Encounter (Signed)
I notified patient of lab results.

## 2017-05-28 ENCOUNTER — Other Ambulatory Visit: Payer: Self-pay

## 2017-05-28 MED ORDER — TAMSULOSIN HCL 0.4 MG PO CAPS: 0.4000 mg | ORAL_CAPSULE | Freq: Every day | ORAL | 0 refills | Status: DC

## 2017-05-28 NOTE — Telephone Encounter (Signed)
Pharmacy sends fax request for 90 day r/x refills on Tamsulosin 0.4mg  for patient.  Last OV: 04/30/17 Last Refill: #30, 3 refills on 04/30/17.    Will approve for #90, 0 additional refills and send note to pharmacy to d/c original 30 day supply script sent in on 04/30/17.

## 2017-05-30 ENCOUNTER — Other Ambulatory Visit: Payer: Self-pay | Admitting: *Deleted

## 2017-08-22 DIAGNOSIS — L218 Other seborrheic dermatitis: Secondary | ICD-10-CM | POA: Diagnosis not present

## 2017-08-28 ENCOUNTER — Ambulatory Visit: Payer: Medicare Other | Admitting: Family Medicine

## 2017-08-28 DIAGNOSIS — Z0289 Encounter for other administrative examinations: Secondary | ICD-10-CM

## 2017-08-30 ENCOUNTER — Ambulatory Visit: Payer: Medicare Other

## 2017-09-10 ENCOUNTER — Ambulatory Visit (INDEPENDENT_AMBULATORY_CARE_PROVIDER_SITE_OTHER): Payer: Medicare Other | Admitting: Podiatry

## 2017-09-10 ENCOUNTER — Encounter: Payer: Self-pay | Admitting: Podiatry

## 2017-09-10 VITALS — BP 149/80 | HR 86 | Ht 69.0 in | Wt 225.0 lb

## 2017-09-10 DIAGNOSIS — L84 Corns and callosities: Secondary | ICD-10-CM | POA: Diagnosis not present

## 2017-09-10 DIAGNOSIS — L989 Disorder of the skin and subcutaneous tissue, unspecified: Secondary | ICD-10-CM | POA: Diagnosis not present

## 2017-09-10 DIAGNOSIS — M7751 Other enthesopathy of right foot: Secondary | ICD-10-CM | POA: Diagnosis not present

## 2017-09-10 MED ORDER — BETAMETHASONE SOD PHOS & ACET 6 (3-3) MG/ML IJ SUSP: 3.0000 mg | Freq: Once | INTRAMUSCULAR | Status: AC

## 2017-09-11 NOTE — Progress Notes (Signed)
   Subjective: 71 year old male presents to the office today as a new patient with a chief complaint of a painful callus lesion of the lateral right 5th digit that has been present for several months. Patient states that the pain is ongoing and is affecting their ability to ambulate without pain. Taking his shoes off also increases the pain. He has been soaking the foot and using OTC corn treatment with no significant relief. Patient presents today for further treatment and evaluation.   Past Medical History:  Diagnosis Date  . Acute cholecystitis 12/2015  . ANXIETY DEPRESSION 10/23/2008  . 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 Bilateral high frequency sensorineural hearing loss    rec hearing aides by ENT  . 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 Post bronchodilator test not improved.  . Diverticulosis 02/2013   by colonoscopy  . Essential hypertension, benign   . History of adenomatous polyp of colon 02/2013   rec rpt 3 yrs  . Overweight(278.02)   . Positive PPD, treated 2014   s/p rifampin x34mo  . Pure hypercholesterolemia   . Tobacco use disorder   . Unspecified gastritis and gastroduodenitis without mention of hemorrhage   . Unspecified sleep apnea      Objective:  Physical Exam General: Alert and oriented x3 in no acute distress  Dermatology: Hyperkeratotic lesion present on the right fifth toe. Pain on palpation with a central nucleated core noted.  Skin is warm, dry and supple bilateral lower extremities. Negative for open lesions or macerations.  Vascular: Palpable pedal pulses bilaterally. No edema or erythema noted. Capillary refill within normal limits.  Neurological: Epicritic and protective threshold grossly intact bilaterally.   Musculoskeletal Exam: Pain on palpation at the keratotic lesion noted. Pain on palpation and ROM to the right fifth toe. Range of motion within  normal limits bilateral. Muscle strength 5/5 in all groups bilateral.  Assessment: #1 Porokeratosis right fifth toe #2 Capsulitis right 5th toe   Plan of Care:  #1 Patient evaluated.  #2 Excisional debridement of keratoic lesion using a chisel blade was performed without incident.  #3 Dressed area with light dressing. #4 Injection of 0.5 mLs Celestone Soluspan injected into the right fifth toe. #5 Recommended good shoe gear. #6 Return to clinic when necessary.   Edrick Kins, DPM [facility]  Dr. Edrick Kins, [address]                                        [place], [place] [address]                Office [phone]  Fax [phone]

## 2017-09-14 ENCOUNTER — Ambulatory Visit (INDEPENDENT_AMBULATORY_CARE_PROVIDER_SITE_OTHER): Payer: Medicare Other

## 2017-09-14 DIAGNOSIS — Z23 Encounter for immunization: Secondary | ICD-10-CM | POA: Diagnosis not present

## 2017-09-18 ENCOUNTER — Other Ambulatory Visit: Payer: Self-pay | Admitting: Family Medicine

## 2017-11-04 ENCOUNTER — Other Ambulatory Visit: Payer: Self-pay | Admitting: Family Medicine

## 2017-12-11 DIAGNOSIS — N400 Enlarged prostate without lower urinary tract symptoms: Secondary | ICD-10-CM | POA: Insufficient documentation

## 2018-01-22 ENCOUNTER — Other Ambulatory Visit: Payer: Self-pay | Admitting: Family Medicine

## 2018-01-22 DIAGNOSIS — Z125 Encounter for screening for malignant neoplasm of prostate: Secondary | ICD-10-CM

## 2018-01-22 DIAGNOSIS — E7800 Pure hypercholesterolemia, unspecified: Secondary | ICD-10-CM

## 2018-01-22 DIAGNOSIS — D563 Thalassemia minor: Secondary | ICD-10-CM

## 2018-01-24 ENCOUNTER — Ambulatory Visit (INDEPENDENT_AMBULATORY_CARE_PROVIDER_SITE_OTHER): Payer: Medicare Other

## 2018-01-24 VITALS — BP 146/78 | HR 96 | Temp 98.4°F | Ht 68.5 in | Wt 236.5 lb

## 2018-01-24 DIAGNOSIS — Z0000 Encounter for general adult medical examination without abnormal findings: Secondary | ICD-10-CM | POA: Diagnosis not present

## 2018-01-24 DIAGNOSIS — E7800 Pure hypercholesterolemia, unspecified: Secondary | ICD-10-CM | POA: Diagnosis not present

## 2018-01-24 DIAGNOSIS — Z125 Encounter for screening for malignant neoplasm of prostate: Secondary | ICD-10-CM

## 2018-01-24 DIAGNOSIS — D563 Thalassemia minor: Secondary | ICD-10-CM

## 2018-01-24 LAB — CBC WITH DIFFERENTIAL/PLATELET
Basophils Absolute: 0.1 10*3/uL (ref 0.0–0.1)
Basophils Relative: 1.4 % (ref 0.0–3.0)
EOS ABS: 0.2 10*3/uL (ref 0.0–0.7)
EOS PCT: 5 % (ref 0.0–5.0)
HEMATOCRIT: 45.7 % (ref 39.0–52.0)
Hemoglobin: 15 g/dL (ref 13.0–17.0)
LYMPHS PCT: 30.9 % (ref 12.0–46.0)
Lymphs Abs: 1.5 10*3/uL (ref 0.7–4.0)
MCHC: 32.8 g/dL (ref 30.0–36.0)
MCV: 72.3 fl — AB (ref 78.0–100.0)
Monocytes Absolute: 0.6 10*3/uL (ref 0.1–1.0)
Monocytes Relative: 11.5 % (ref 3.0–12.0)
NEUTROS ABS: 2.5 10*3/uL (ref 1.4–7.7)
Neutrophils Relative %: 51.2 % (ref 43.0–77.0)
PLATELETS: 169 10*3/uL (ref 150.0–400.0)
RBC: 6.32 Mil/uL — ABNORMAL HIGH (ref 4.22–5.81)
RDW: 20.9 % — AB (ref 11.5–15.5)
WBC: 4.8 10*3/uL (ref 4.0–10.5)

## 2018-01-24 LAB — COMPREHENSIVE METABOLIC PANEL
ALBUMIN: 3.9 g/dL (ref 3.5–5.2)
ALT: 10 U/L (ref 0–53)
AST: 11 U/L (ref 0–37)
Alkaline Phosphatase: 81 U/L (ref 39–117)
BUN: 10 mg/dL (ref 6–23)
CALCIUM: 9.2 mg/dL (ref 8.4–10.5)
CHLORIDE: 104 meq/L (ref 96–112)
CO2: 30 meq/L (ref 19–32)
Creatinine, Ser: 1 mg/dL (ref 0.40–1.50)
GFR: 94.95 mL/min (ref >60.00)
Glucose, Bld: 94 mg/dL (ref 70–99)
POTASSIUM: 4 meq/L (ref 3.5–5.1)
Sodium: 140 mEq/L (ref 135–145)
Total Bilirubin: 0.9 mg/dL (ref 0.2–1.2)
Total Protein: 6 g/dL (ref 6.0–8.3)

## 2018-01-24 LAB — LIPID PANEL
CHOL/HDL RATIO: 5
CHOLESTEROL: 166 mg/dL (ref 0–200)
HDL: 36.6 mg/dL — ABNORMAL LOW (ref >39.00)
LDL CALC: 113 mg/dL — AB (ref 0–99)
NonHDL: 129.25
TRIGLYCERIDES: 80 mg/dL (ref 0.0–149.0)
VLDL: 16 mg/dL (ref 0.0–40.0)

## 2018-01-24 LAB — PSA: PSA: 0.78 ng/mL (ref 0.10–4.00)

## 2018-01-24 NOTE — Progress Notes (Signed)
Subjective:   Dominic Turner is a 72 y.o. male who presents for Medicare Annual/Subsequent preventive examination.  Review of Systems:  N/A Cardiac Risk Factors include: advanced age (>79men, >63 women);male gender;obesity (BMI >30kg/m2);hypertension;smoking/ tobacco exposure     Objective:    Vitals: BP (!) 146/78 (BP Location: Left Arm, Patient Position: Sitting, Cuff Size: Normal)   Pulse 96   Temp 98.4 F (36.9 C) (Oral)   Ht 5' 8.5" (1.74 m) Comment: no shoes  Wt 236 lb 8 oz (107.3 kg)   SpO2 99%   BMI 35.44 kg/m   Body mass index is 35.44 kg/m.  Advanced Directives 01/24/2018 12/30/2015  Does Patient Have a Medical Advance Directive? Yes Yes  Type of Paramedic of [place];Living will [place];Living will  Does patient want to make changes to medical advance directive? - No - Patient declined  Copy of [place] in Chart? No - copy requested -    Tobacco Social History   Tobacco Use  Smoking Status Current Every Day Smoker  . Packs/day: 0.50  . Years: 48.00  . Pack years: 24.00  . Types: Cigarettes  . Start date: 12/12/1983  Smokeless Tobacco Never Used     Ready to quit: No Counseling given: No   Clinical Intake:  Pre-visit preparation completed: Yes  Pain : No/denies pain Pain Score: 0-No pain     Nutritional Status: BMI > 30  Obese Nutritional Risks: None Diabetes: No  How often do you need to have someone help you when you read instructions, pamphlets, or other written materials from your doctor or pharmacy?: 1 - Never What is the last grade level you completed in school?: 12th grade  Interpreter Needed?: No  Comments: pt and son live together; spouse is in nursing home Information entered by :: LPinson, LPN  Past Medical History:  Diagnosis Date  . Acute cholecystitis 12/2015  . ANXIETY DEPRESSION 10/23/2008  . 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 Bilateral high frequency sensorineural hearing loss    rec hearing aides by ENT  . 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 Post bronchodilator test not improved.  . Diverticulosis 02/2013   by colonoscopy  . Essential hypertension, benign   . History of adenomatous polyp of colon 02/2013   rec rpt 3 yrs  . Overweight(278.02)   . Positive PPD, treated 2014   s/p rifampin x65mo  . Pure hypercholesterolemia   . Tobacco use disorder   . Unspecified gastritis and gastroduodenitis without mention of hemorrhage   . Unspecified sleep apnea    Past Surgical History:  Procedure Laterality Date  . CATARACT EXTRACTION, BILATERAL Bilateral   . CHOLECYSTECTOMY N/A 12/30/2015   Procedure: LAPAROSCOPIC CHOLECYSTECTOMY;  Surgeon: Florene Glen, MD;  Location: [facility] ORS;  Service: General;  Laterality: N/A;  . COLONOSCOPY  03/03/2013   tubular adenoma x3, diverticulosis, rpt 3 yrs (Dr. Cristina Gong)  . COLONOSCOPY  05/2016   TA x3, diverticulosis, single angiodysplastic lesion, rpt 3 yrs (Buccini)   Family History  Problem Relation Age of Onset  . Cirrhosis Father        died in 2's  . Hypertension Mother   . Heart attack Sister   . Stroke Sister   . Heart attack Maternal Grandmother   . Stroke Maternal Grandmother   . Thyroid disease Sister   . Diabetes Sister   .  Cancer Neg Hx    Social History   Socioeconomic History  . Marital status: Married    Spouse name: None  . Number of children: 2  . Years of education: None  . Highest education level: None  Social Needs  . Financial resource strain: None  . Food insecurity - worry: None  . Food insecurity - inability: None  . Transportation needs - medical: None  . Transportation needs - non-medical: None  Occupational History  . Occupation: truck Geophysicist/field seismologist  Tobacco Use  . Smoking status: Current Every Day Smoker    Packs/day: 0.50    Years: 48.00    Pack years: 24.00     Types: Cigarettes    Start date: 12/12/1983  . Smokeless tobacco: Never Used  Substance and Sexual Activity  . Alcohol use: No    Alcohol/week: 0.0 oz  . Drug use: No  . Sexual activity: No  Other Topics Concern  . None  Social History Narrative   Lives alone. Wife lives at [facility] in [place] in [place] (memory unit). Sees wife daily.    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 acetaminophen (TYLENOL) 500 MG tablet Take 500 mg by mouth every 6 (six) hours as needed.  Marland Kitchen amLODipine (NORVASC) 10 MG tablet TAKE 1 TABLET (10 MG TOTAL) BY MOUTH DAILY.  Marland Kitchen aspirin 81 MG tablet Take 81 mg by mouth daily.    . clobetasol (TEMOVATE) 0.05 % external solution Apply 1 application topically.  Marland Kitchen DOCUSATE SODIUM PO Take 1 capsule by mouth daily.  . fluticasone (FLONASE) 50 MCG/ACT nasal spray PLACE 2 SPRAYS INTO BOTH NOSTRILS DAILY.  Marland Kitchen ibuprofen (ADVIL,MOTRIN) 600 MG tablet Take 1 tablet (600 mg total) by mouth 3 (three) times daily as needed. (Patient taking differently: Take 600 mg by mouth daily as needed. )  . Multiple Vitamin Essential TABS Take 1 tablet by mouth daily.    . pravastatin (PRAVACHOL) 40 MG tablet TAKE 1 TABLET (40 MG TOTAL) BY MOUTH DAILY.  . tamsulosin (FLOMAX) 0.4 MG CAPS capsule TAKE 1 CAPSULE BY MOUTH EVERY DAY  . tiotropium (SPIRIVA HANDIHALER) 18 MCG inhalation capsule Place 1 capsule (18 mcg total) into inhaler and inhale at bedtime.  . triamcinolone cream (KENALOG) 0.1 % Apply 1 application topically 2 (two) times daily.   Facility-Administered Encounter Medications as of 01/24/2018  Medication  . betamethasone acetate-betamethasone sodium phosphate (CELESTONE) injection 3 mg    Activities of Daily Living In your present state of health, do you have any difficulty performing the following  activities: 01/24/2018  Hearing? Y  Vision? N  Difficulty concentrating or making decisions? N  Walking or climbing stairs? N  Dressing or bathing? N  Doing errands, shopping? N  Preparing Food and eating ? N  Using the Toilet? N  In the past six months, have you accidently leaked urine? Y  Comment pt reports intermittent episodes of incontinence  Do you have problems with loss of bowel control? N  Managing your Medications? N  Managing your Finances? N  Housekeeping or managing your Housekeeping? N  Some recent data might be hidden    Patient Care Team: Ria Bush, MD as PCP - General   Assessment:   This is a routine wellness examination for Katsumi.   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 Follow up with Primary Care Provider     Starting 01/24/2018, I will continue to take medications as prescribed and to keep appointments with PCP as scheduled.        Fall Risk Fall Risk  01/24/2018 01/19/2017 01/18/2016 01/15/2015 01/09/2014  Falls in the past year? No No No No No    Depression Screen PHQ 2/9 Scores 01/24/2018 01/19/2017 01/18/2016 01/15/2015  PHQ - 2 Score 0 0 0 0  PHQ- 9 Score 0 - - -    Cognitive Function MMSE - Mini Mental State Exam 01/24/2018  Orientation to time 5  Orientation to Place 5  Registration 3  Attention/ Calculation 0  Recall 2  Recall-comments unable to recall 1 of 3 words  Language- name 2 objects 0  Language- repeat 1  Language- follow 3 step command 1  Language- follow 3 step command-comments unable  to follow 2 steps of 3 step command  Language- read & follow direction 0  Write a sentence 0  Copy design 0  Total score 17     PLEASE NOTE: A Mini-Cog screen was completed. Maximum score is 20. A value of 0 denotes this part of Folstein MMSE was not completed or the patient failed this part of the Mini-Cog screening.   Mini-Cog Screening Orientation to Time - Max 5 pts Orientation to Place - Max 5 pts Registration - Max 3 pts Recall - Max 3 pts Language Repeat - Max 1 pts Language Follow 3 Step Command - Max 3 pts   Immunization History  Administered Date(s) Administered  . Influenza Split 11/15/2012  . Influenza Whole 12/06/1999, 10/11/2013  . Influenza, High Dose Seasonal PF 09/12/2015  . Influenza,inj,Quad PF,6+ Mos 10/02/2014, 09/14/2017  . Influenza-Unspecified 09/18/2016  . PPD Test 02/07/2013  . Pneumococcal Conjugate-13 01/15/2015  . Pneumococcal Polysaccharide-23 12/05/2002, 06/26/2008, 01/09/2014  . Td 03/11/1998, 06/26/2008  . Zoster 10/11/2013    Screening Tests Health Maintenance  Topic Date Due  . DTaP/Tdap/Td (1 - Tdap) 06/26/2018 (Originally 06/27/2008)  . TETANUS/TDAP  06/26/2018  . COLONOSCOPY  05/12/2019  . INFLUENZA VACCINE  Completed  . Hepatitis C Screening  Completed  . PNA vac Low Risk Adult  Completed      Plan:     I have personally reviewed, addressed, and noted the following in the patient's chart:  A. Medical and social history B. Use of alcohol, tobacco or illicit drugs  C. Current medications and supplements D. Functional ability and status E.  Nutritional status F.  Physical activity G. Advance directives H. List of other physicians I.  Hospitalizations, surgeries, and ER visits in previous 12 months J.  [place] to include hearing, vision, cognitive, depression L. Referrals and appointments - none  In addition, I have reviewed and discussed with patient certain preventive protocols, quality metrics, and best  practice recommendations. A written personalized care plan for preventive services as well as general preventive health recommendations were provided to patient.  See attached  scanned questionnaire for additional information.   Signed,   Lindell Noe, MHA, BS, LPN Health Coach

## 2018-01-24 NOTE — Patient Instructions (Addendum)
Mr. Blick , Thank you for taking time to come for your Medicare Wellness Visit. I appreciate your ongoing commitment to your health goals. Please review the following plan we discussed and let me know if I can assist you in the future.   These are the goals we discussed: Goals    . Follow up with Primary Care Provider     Starting 01/24/2018, I will continue to take medications as prescribed and to keep appointments with PCP as scheduled.        This is a list of the screening recommended for you and due dates:  Health Maintenance  Topic Date Due  . DTaP/Tdap/Td vaccine (1 - Tdap) 06/26/2018*  . Tetanus Vaccine  06/26/2018  . Colon Cancer Screening  05/12/2019  . Flu Shot  Completed  .  Hepatitis C: One time screening is recommended by Center for Disease Control  (CDC) for  adults born from [birth year] through 1964.   Completed  . Pneumonia vaccines  Completed  *Topic was postponed. The date shown is not the original due date.     Preventive Care for Adults  A healthy lifestyle and preventive care can promote health and wellness. Preventive health guidelines for adults include the following key practices.  . A routine yearly physical is a good way to check with your health care provider about your health and preventive screening. It is a chance to share any concerns and updates on your health and to receive a thorough exam.  . Visit your dentist for a routine exam and preventive care every 6 months. Brush your teeth twice a day and floss once a day. Good oral hygiene prevents tooth decay and gum disease.  . The frequency of eye exams is based on your age, health, family medical history, use  of contact lenses, and other factors. Follow your health care provider's recommendations for frequency of eye exams.  . Eat a healthy diet. Foods like vegetables, fruits, whole grains, low-fat dairy products, and lean protein foods contain the nutrients you need without too many calories.  Decrease your intake of foods high in solid fats, added sugars, and salt. Eat the right amount of calories for you. Get information about a proper diet from your health care provider, if necessary.  . Regular physical exercise is one of the most important things you can do for your health. Most adults should get at least 150 minutes of moderate-intensity exercise (any activity that increases your heart rate and causes you to sweat) each week. In addition, most adults need muscle-strengthening exercises on 2 or more days a week.  Silver Sneakers may be a benefit available to you. To determine eligibility, you may visit the website: www.silversneakers.com or contact program at 4018040041 Mon-Fri between 8AM-8PM.   . Maintain a healthy weight. The body mass index (BMI) is a screening tool to identify possible weight problems. It provides an estimate of body fat based on height and weight. Your health care provider can find your BMI and can help you achieve or maintain a healthy weight.   For adults 20 years and older: ? A BMI below 18.5 is considered underweight. ? A BMI of 18.5 to 24.9 is normal. ? A BMI of 25 to 29.9 is considered overweight. ? A BMI of 30 and above is considered obese.   . Maintain normal blood lipids and cholesterol levels by exercising and minimizing your intake of saturated fat. Eat a balanced diet with plenty of fruit and vegetables. Blood  tests for lipids and cholesterol should begin at age 18 and be repeated every 5 years. If your lipid or cholesterol levels are high, you are over 50, or you are at high risk for heart disease, you may need your cholesterol levels checked more frequently. Ongoing high lipid and cholesterol levels should be treated with medicines if diet and exercise are not working.  . If you smoke, find out from your health care provider how to quit. If you do not use tobacco, please do not start.  . If you choose to drink alcohol, please do not consume  more than 2 drinks per day. One drink is considered to be 12 ounces (355 mL) of beer, 5 ounces (148 mL) of wine, or 1.5 ounces (44 mL) of liquor.  . If you are 74-55 years old, ask your health care provider if you should take aspirin to prevent strokes.  . Use sunscreen. Apply sunscreen liberally and repeatedly throughout the day. You should seek shade when your shadow is shorter than you. Protect yourself by wearing long sleeves, pants, a wide-brimmed hat, and sunglasses year round, whenever you are outdoors.  . Once a month, do a whole body skin exam, using a mirror to look at the skin on your back. Tell your health care provider of new moles, moles that have irregular borders, moles that are larger than a pencil eraser, or moles that have changed in shape or color.

## 2018-01-24 NOTE — Progress Notes (Signed)
PCP notes:   Health maintenance:  No gaps identified.   Abnormal screenings:   Mini-Cog score: 17/20 MMSE - Mini Mental State Exam 01/24/2018  Orientation to time 5  Orientation to Place 5  Registration 3  Attention/ Calculation 0  Recall 2  Recall-comments unable to recall 1 of 3 words  Language- name 2 objects 0  Language- repeat 1  Language- follow 3 step command 1  Language- follow 3 step command-comments unable to follow 2 steps of 3 step command  Language- read & follow direction 0  Write a sentence 0  Copy design 0  Total score 17     Hearing - failed  Hearing Screening   125Hz  250Hz  500Hz  1000Hz  2000Hz  3000Hz  4000Hz  6000Hz  8000Hz   Right ear:   40 40 0  0    Left ear:   40 40 0  0      Patient concerns:   Constipation - pt takes stool softener daily. States he has hx of IBS and feels condition is getting worse.  Nurse concerns:  None  Next PCP appt:   01/25/18 @ 0830

## 2018-01-24 NOTE — Progress Notes (Signed)
Pre visit review using our clinic review tool, if applicable. No additional management support is needed unless otherwise documented below in the visit note. 

## 2018-01-25 ENCOUNTER — Ambulatory Visit (INDEPENDENT_AMBULATORY_CARE_PROVIDER_SITE_OTHER): Payer: Medicare Other | Admitting: Family Medicine

## 2018-01-25 ENCOUNTER — Encounter: Payer: Self-pay | Admitting: Family Medicine

## 2018-01-25 ENCOUNTER — Ambulatory Visit (INDEPENDENT_AMBULATORY_CARE_PROVIDER_SITE_OTHER)
Admission: RE | Admit: 2018-01-25 | Discharge: 2018-01-25 | Disposition: A | Discharge status: Home / Self Care | Payer: Medicare Other | Source: Ambulatory Visit | Attending: Family Medicine | Admitting: Family Medicine

## 2018-01-25 VITALS — BP 128/74 | HR 67 | Temp 98.2°F | Ht 69.0 in | Wt 236.0 lb

## 2018-01-25 DIAGNOSIS — D563 Thalassemia minor: Secondary | ICD-10-CM

## 2018-01-25 DIAGNOSIS — Z7189 Other specified counseling: Secondary | ICD-10-CM | POA: Diagnosis not present

## 2018-01-25 DIAGNOSIS — J449 Chronic obstructive pulmonary disease, unspecified: Secondary | ICD-10-CM

## 2018-01-25 DIAGNOSIS — Z0001 Encounter for general adult medical examination with abnormal findings: Secondary | ICD-10-CM | POA: Diagnosis not present

## 2018-01-25 DIAGNOSIS — F17200 Nicotine dependence, unspecified, uncomplicated: Secondary | ICD-10-CM

## 2018-01-25 DIAGNOSIS — J986 Disorders of diaphragm: Secondary | ICD-10-CM

## 2018-01-25 DIAGNOSIS — I10 Essential (primary) hypertension: Secondary | ICD-10-CM

## 2018-01-25 DIAGNOSIS — E7800 Pure hypercholesterolemia, unspecified: Secondary | ICD-10-CM

## 2018-01-25 DIAGNOSIS — E669 Obesity, unspecified: Secondary | ICD-10-CM

## 2018-01-25 IMAGING — DX DG CHEST 2V
2 series · 2 of 2 positions shown · non-contrast
Comparison: [DATE]

CLINICAL DATA: Follow-up right hemidiaphragm elevation

EXAM:
CHEST  2 VIEW

[chest pa]
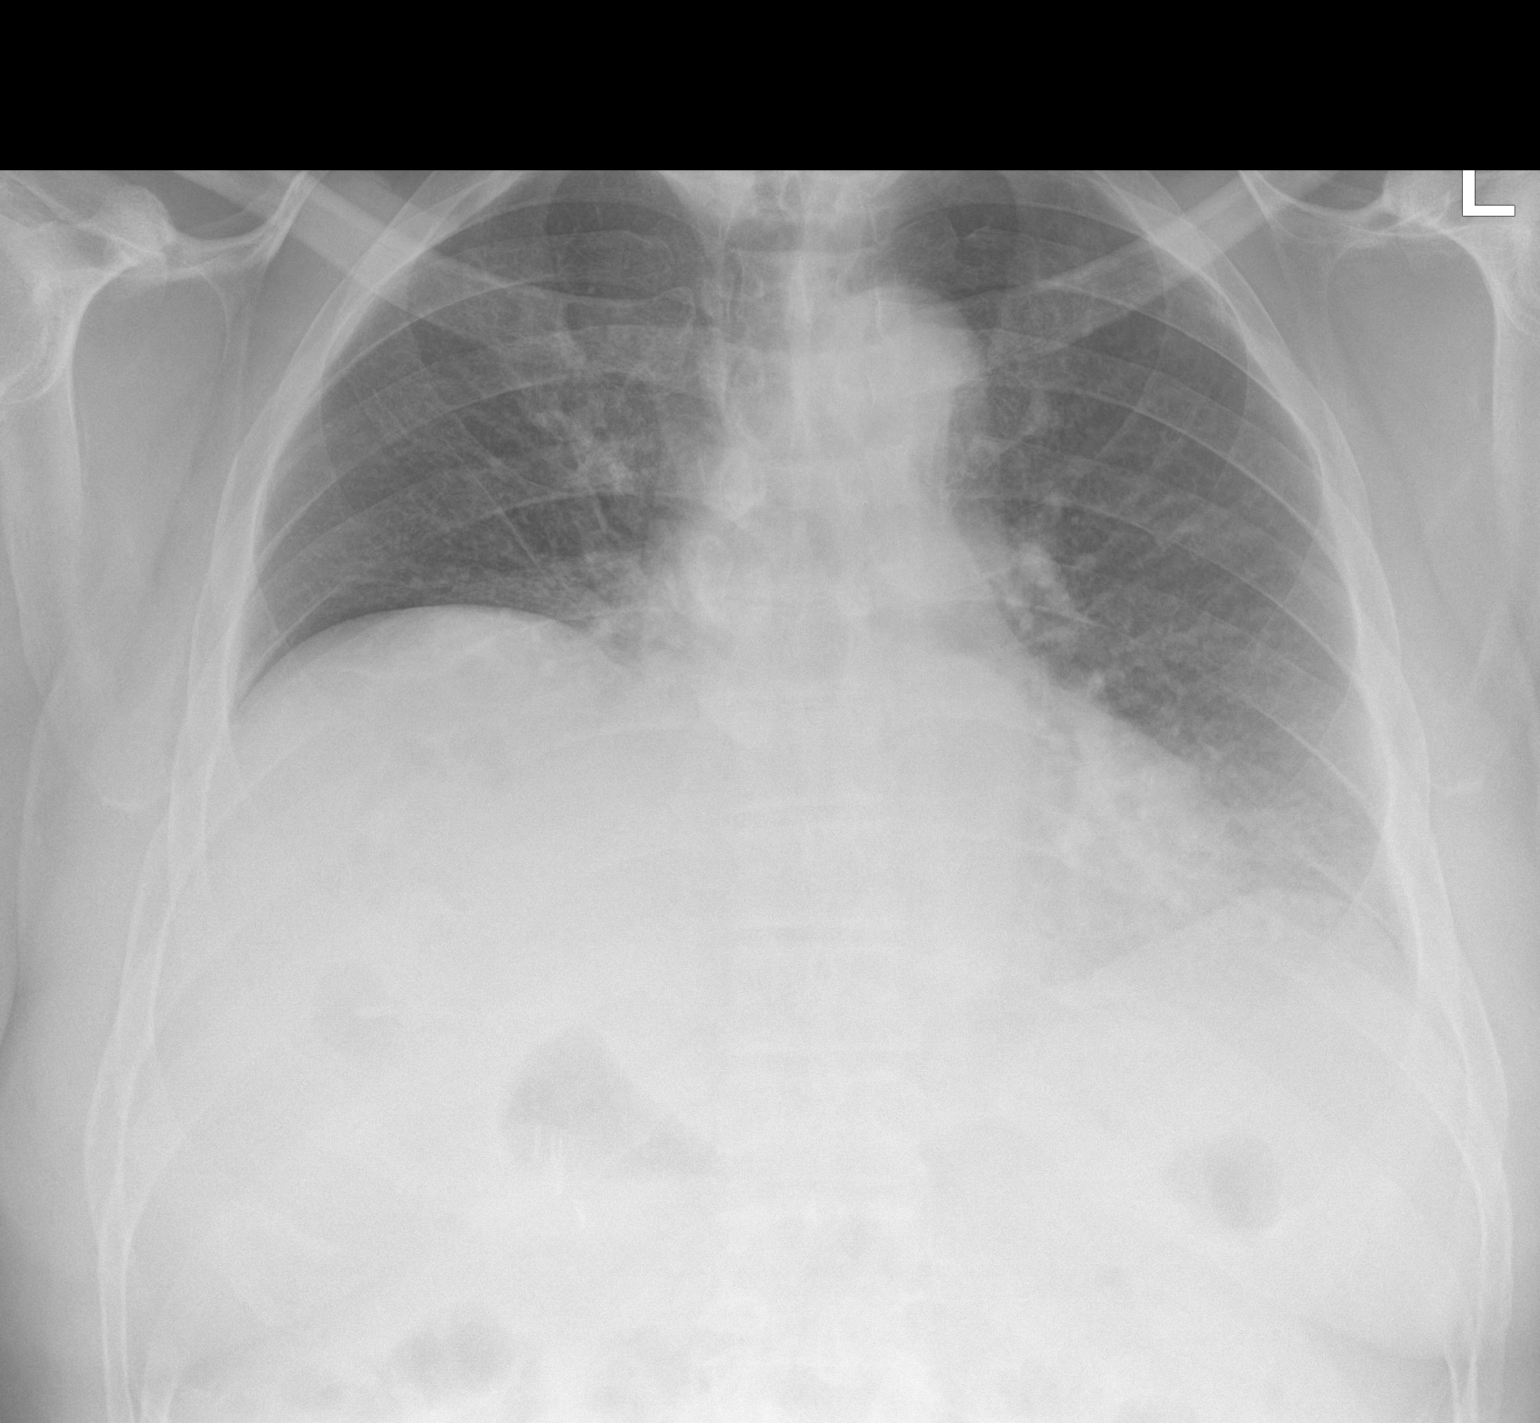

[chest lat]
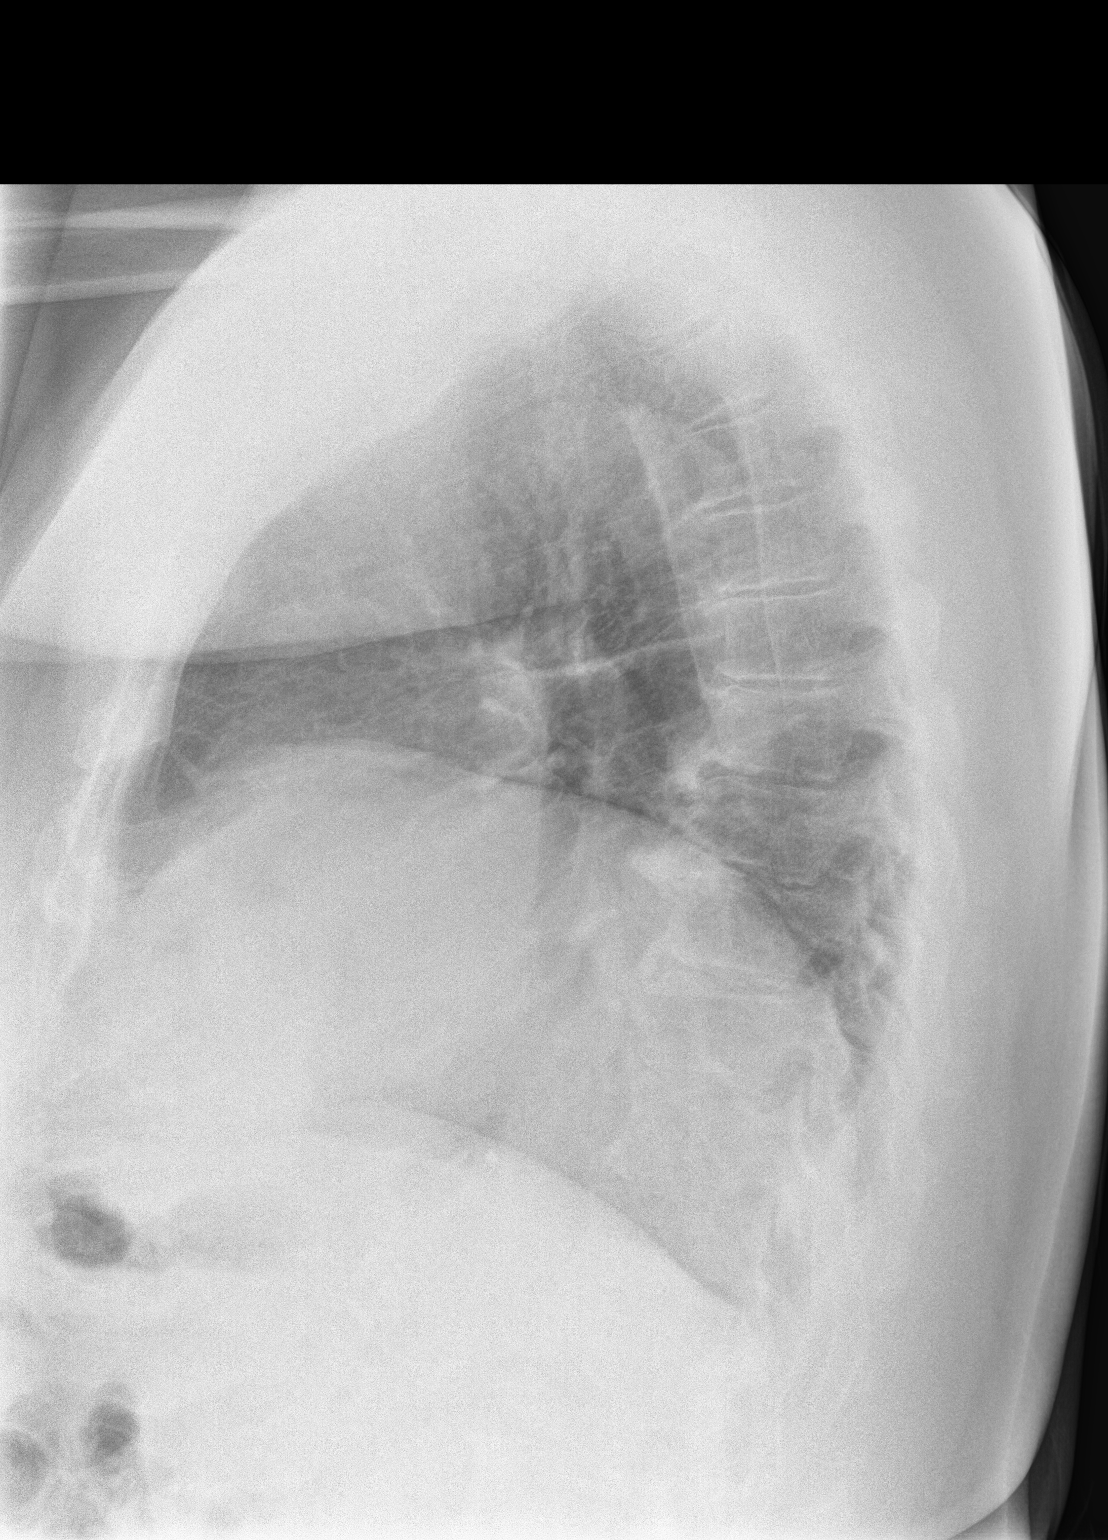

[2 of 2 positions shown; findings below may reference images not displayed]

FINDINGS: Elevated right hemidiaphragm, stable. No confluent airspace
opacities. Heart is normal size. No effusions or acute bony
abnormality.
IMPRESSION: No change in the elevated right hemidiaphragm.  No active disease.

## 2018-01-25 MED ORDER — AMLODIPINE BESYLATE 10 MG PO TABS: ORAL_TABLET | ORAL | 3 refills | Status: DC

## 2018-01-25 MED ORDER — PRAVASTATIN SODIUM 40 MG PO TABS: ORAL_TABLET | ORAL | 3 refills | Status: DC

## 2018-01-25 MED ORDER — FLUTICASONE PROPIONATE 50 MCG/ACT NA SUSP: 2.0000 | Freq: Every day | NASAL | 3 refills | Status: DC

## 2018-01-25 NOTE — Assessment & Plan Note (Signed)
Encourage continued cessation attempts.  Not eligible for lung cancer screening (~25 PY hx)

## 2018-01-25 NOTE — Patient Instructions (Addendum)
Make sure you have good fiber in diet or consider soluble fiber supplement like metamucil or benefiber - increase water. Let me know if not improving to consider medication.  If interested, check with pharmacy about new 2 shot shingles series (shingrix).  Bring [place] copy of your living will to update your chart.  Chest xray today to follow right hemidiaphragm elevation.  You are doing well today. Return as needed or in 1 year for next physical.  Health Maintenance, Male A healthy lifestyle and preventive care is important for your health and wellness. Ask your health care provider about what schedule of regular examinations is right for you. What should I know about weight and diet? Eat a Healthy Diet  Eat plenty of vegetables, fruits, whole grains, low-fat dairy products, and lean protein.  Do not eat a lot of foods high in solid fats, added sugars, or salt.  Maintain a Healthy Weight Regular exercise can help you achieve or maintain a healthy weight. You should:  Do at least 150 minutes of exercise each week. The exercise should increase your heart rate and make you sweat (moderate-intensity exercise).  Do strength-training exercises at least twice a week.  Watch Your Levels of Cholesterol and Blood Lipids  Have your blood tested for lipids and cholesterol every 5 years starting at 71 years of age. If you are at high risk for heart disease, you should start having your blood tested when you are 71 years old. You may need to have your cholesterol levels checked more often if: ? Your lipid or cholesterol levels are high. ? You are older than 71 years of age. ? You are at high risk for heart disease.  What should I know about cancer screening? Many types of cancers can be detected early and may often be prevented. Lung Cancer  You should be screened every year for lung cancer if: ? You are a current smoker who has smoked for at least 30 years. ? You are a former smoker who has quit  within the past 15 years.  Talk to your health care provider about your screening options, when you should start screening, and how often you should be screened.  Colorectal Cancer  Routine colorectal cancer screening usually begins at 71 years of age and should be repeated every 5-10 years until you are 71 years old. You may need to be screened more often if early forms of precancerous polyps or small growths are found. Your health care provider may recommend screening at an earlier age if you have risk factors for colon cancer.  Your health care provider may recommend using home test kits to check for hidden blood in the stool.  A small camera at the end of a tube can be used to examine your colon (sigmoidoscopy or colonoscopy). This checks for the earliest forms of colorectal cancer.  Prostate and Testicular Cancer  Depending on your age and overall health, your health care provider may do certain tests to screen for prostate and testicular cancer.  Talk to your health care provider about any symptoms or concerns you have about testicular or prostate cancer.  Skin Cancer  Check your skin from head to toe regularly.  Tell your health care provider about any new moles or changes in moles, especially if: ? There is a change in a mole's size, shape, or color. ? You have a mole that is larger than a pencil eraser.  Always use sunscreen. Apply sunscreen liberally and repeat throughout the  day.  Protect yourself by wearing long sleeves, pants, a wide-brimmed hat, and sunglasses when outside.  What should I know about heart disease, diabetes, and high blood pressure?  If you are 38-35 years of age, have your blood pressure checked every 3-5 years. If you are 2 years of age or older, have your blood pressure checked every year. You should have your blood pressure measured twice-once when you are at a hospital or clinic, and once when you are not at a hospital or clinic. Record the average  of the two measurements. To check your blood pressure when you are not at a hospital or clinic, you can use: ? An automated blood pressure machine at a pharmacy. ? A home blood pressure monitor.  Talk to your health care provider about your target blood pressure.  If you are between 50-21 years old, ask your health care provider if you should take aspirin to prevent heart disease.  Have regular diabetes screenings by checking your fasting blood sugar level. ? If you are at a normal weight and have a low risk for diabetes, have this test once every three years after the age of 2. ? If you are overweight and have a high risk for diabetes, consider being tested at a younger age or more often.  A one-time screening for abdominal aortic aneurysm (AAA) by ultrasound is recommended for men aged 67-75 years who are current or former smokers. What should I know about preventing infection? Hepatitis B If you have a higher risk for hepatitis B, you should be screened for this virus. Talk with your health care provider to find out if you are at risk for hepatitis B infection. Hepatitis C Blood testing is recommended for:  Everyone born from [birth year] through 1964.  Anyone with known risk factors for hepatitis C.  Sexually Transmitted Diseases (STDs)  You should be screened each year for STDs including gonorrhea and chlamydia if: ? You are sexually active and are younger than 71 years of age. ? You are older than 71 years of age and your health care provider tells you that you are at risk for this type of infection. ? Your sexual activity has changed since you were last screened and you are at an increased risk for chlamydia or gonorrhea. Ask your health care provider if you are at risk.  Talk with your health care provider about whether you are at high risk of being infected with HIV. Your health care provider may recommend a prescription medicine to help prevent HIV infection.  What else can I  do?  Schedule regular health, dental, and eye exams.  Stay current with your vaccines (immunizations).  Do not use any tobacco products, such as cigarettes, chewing tobacco, and e-cigarettes. If you need help quitting, ask your health care provider.  Limit alcohol intake to no more than 2 drinks per day. One drink equals 12 ounces of beer, 5 ounces of wine, or 1 ounces of hard liquor.  Do not use street drugs.  Do not share needles.  Ask your health care provider for help if you need support or information about quitting drugs.  Tell your health care provider if you often feel depressed.  Tell your health care provider if you have ever been abused or do not feel safe at home. This information is not intended to replace advice given to you by your health care provider. Make sure you discuss any questions you have with your health care provider. Document Released:  05/25/2008 Document Revised: 07/26/2016 Document Reviewed: 08/31/2015 Elsevier Interactive Patient Education  [employer].

## 2018-01-25 NOTE — Progress Notes (Signed)
BP 128/74 (BP Location: Left Arm, Patient Position: Sitting, Cuff Size: Large)   Pulse 67   Temp 98.2 F (36.8 C) (Oral)   Ht 5\' 9"  (1.753 m)   Wt 236 lb (107 kg)   SpO2 96%   BMI 34.85 kg/m    CC: CPE Subjective:    Patient ID: Dominic Turner, male    DOB: 1946-10-23, 72 y.o.   MRN: [id number]  HPI: Dominic Turner is a 72 y.o. male presenting on 01/25/2018 for Annual Exam (Pt 2.)   Sees wife every day who lives at [place] - she is doing well.   Smoking - 1/2 ppd. COPD - compliant with spiriva. Has tried nicorette gum. May consider nicoderm patch. Tried chantix - caused rash.   Saw Lesia yesterday for medicare wellness visit. Note reviewed.  Mini-cog 17/20.  Pt worried about worsening IBS - constipation. Managed with colace 100mg  once daily. Good water and fiber. Occasional ex lax.  Preventative: COLONOSCOPY 05/2016; TA x3, diverticulosis, single angiodysplastic lesion, rpt 3 yrs (Buccini) Prostate - normal Q2 yrs next due 2020 Lung cancer screening - referred last year, not eligible, endorsed 24 PY hx.  Flu shot yearly Pneumovax - 20009, 2015. prevnar 2016 Td 2009  zostavax - 10/2013  shingrix - discussed Advanced directives: has living will at home for himself and wife. HCPOA - son. Asked to bring copy. Doesn't want prolonged life support. Would want sons to make decision for temporary life support.  Seat belt use discussed Sunscreen use discussed. No changing moles in skin. Smoker - 1/2 ppd Alcohol - none  Lives alone. Wife lives at [facility] in [place] in [place] (memory unit). Sees wife daily.  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 Interim medical history since our last visit reviewed. Allergies and medications reviewed and updated. Outpatient Medications Prior  to Visit  Medication Sig Dispense Refill  . acetaminophen (TYLENOL) 500 MG tablet Take 500 mg by mouth every 6 (six) hours as needed.    Marland Kitchen aspirin 81 MG tablet Take 81 mg by mouth daily.      . clobetasol (TEMOVATE) 0.05 % external solution Apply 1 application topically.    Mariane Baumgarten Calcium (STOOL SOFTENER PO) Take 1 tablet by mouth daily.    Marland Kitchen DOCUSATE SODIUM PO Take 1 capsule by mouth daily.    Marland Kitchen ibuprofen (ADVIL,MOTRIN) 600 MG tablet Take 1 tablet (600 mg total) by mouth 3 (three) times daily as needed. (Patient taking differently: Take 600 mg by mouth daily as needed. ) 20 tablet 0  . Multiple Vitamin Essential TABS Take 1 tablet by mouth daily.      . tamsulosin (FLOMAX) 0.4 MG CAPS capsule TAKE 1 CAPSULE BY MOUTH EVERY DAY 90 capsule 1  . tiotropium (SPIRIVA HANDIHALER) 18 MCG inhalation capsule Place 1 capsule (18 mcg total) into inhaler and inhale at bedtime. 90 capsule 3  . triamcinolone cream (KENALOG) 0.1 % Apply 1 application topically 2 (two) times daily. 30 g 0  . amLODipine (NORVASC) 10 MG tablet TAKE 1 TABLET (10 MG TOTAL) BY MOUTH DAILY. 90 tablet 3  . fluticasone (FLONASE) 50 MCG/ACT nasal spray PLACE 2 SPRAYS INTO BOTH NOSTRILS DAILY. 16 g 6  . pravastatin (PRAVACHOL) 40 MG tablet TAKE 1 TABLET (40 MG TOTAL) BY MOUTH DAILY. 90 tablet 3   Facility-Administered Medications  Prior to Visit  Medication Dose Route Frequency Provider Last Rate Last Dose  . betamethasone acetate-betamethasone sodium phosphate (CELESTONE) injection 3 mg  3 mg Intramuscular Once Edrick Kins, DPM         Per HPI unless specifically indicated in ROS section below Review of Systems  Constitutional: Negative for activity change, appetite change, chills, fatigue, fever and unexpected weight change.  HENT: Negative for hearing loss.   Eyes: Negative for visual disturbance.  Respiratory: Negative for cough, chest tightness, shortness of breath and wheezing.   Cardiovascular: Negative for chest pain,  palpitations and leg swelling.  Gastrointestinal: Positive for constipation. Negative for abdominal distention, abdominal pain, blood in stool, diarrhea, nausea and vomiting.  Genitourinary: Negative for difficulty urinating and hematuria.  Musculoskeletal: Negative for arthralgias, myalgias and neck pain.  Skin: Negative for rash.  Neurological: Negative for dizziness, seizures, syncope and headaches.  Hematological: Negative for adenopathy. Does not bruise/bleed easily.  Psychiatric/Behavioral: Negative for dysphoric mood. The patient is not nervous/anxious.        Objective:    BP 128/74 (BP Location: Left Arm, Patient Position: Sitting, Cuff Size: Large)   Pulse 67   Temp 98.2 F (36.8 C) (Oral)   Ht 5\' 9"  (1.753 m)   Wt 236 lb (107 kg)   SpO2 96%   BMI 34.85 kg/m   Wt Readings from Last 3 Encounters:  01/25/18 236 lb (107 kg)  01/24/18 236 lb 8 oz (107.3 kg)  09/10/17 225 lb (102.1 kg)    Physical Exam  Constitutional: He is oriented to person, place, and time. He appears well-developed and well-nourished. No distress.  HENT:  Head: Normocephalic and atraumatic.  Right Ear: Hearing, tympanic membrane, external ear and ear canal normal.  Left Ear: Hearing, tympanic membrane, external ear and ear canal normal.  Nose: Nose normal.  Mouth/Throat: Uvula is midline, oropharynx is clear and moist and mucous membranes are normal. No oropharyngeal exudate, posterior oropharyngeal edema or posterior oropharyngeal erythema.  Eyes: Conjunctivae and EOM are normal. Pupils are equal, round, and reactive to light. No scleral icterus.  Neck: Normal range of motion. Neck supple. No thyromegaly present.  Cardiovascular: Normal rate, regular rhythm, normal heart sounds and intact distal pulses.  No murmur heard. Pulses:      Radial pulses are 2+ on the right side, and 2+ on the left side.  Pulmonary/Chest: Effort normal. No respiratory distress. He has decreased breath sounds (chronic) in  the right lower field. He has no wheezes. He has no rhonchi. He has no rales.  Abdominal: Soft. Bowel sounds are normal. He exhibits no distension and no mass. There is no tenderness. There is no rebound and no guarding.  Musculoskeletal: Normal range of motion. He exhibits no edema.  Lymphadenopathy:    He has no cervical adenopathy.  Neurological: He is alert and oriented to person, place, and time.  CN grossly intact, station and gait intact  Skin: Skin is warm and dry. No rash noted.  Psychiatric: He has a normal mood and affect. His behavior is normal. Judgment and thought content normal.  Nursing note and vitals reviewed.  Results for orders placed or performed in visit on 01/24/18  PSA  Result Value Ref Range   PSA 0.78 0.10 - 4.00 ng/mL  CBC with Differential/Platelet  Result Value Ref Range   WBC 4.8 4.0 - 10.5 K/uL   RBC 6.32 (H) 4.22 - 5.81 Mil/uL   Hemoglobin 15.0 13.0 - 17.0 g/dL   HCT  45.7 39.0 - 52.0 %   MCV 72.3 (L) 78.0 - 100.0 fl   MCHC 32.8 30.0 - 36.0 g/dL   RDW 20.9 (H) 11.5 - 15.5 %   Platelets 169.0 150.0 - 400.0 K/uL   Neutrophils Relative % 51.2 43.0 - 77.0 %   Lymphocytes Relative 30.9 12.0 - 46.0 %   Monocytes Relative 11.5 3.0 - 12.0 %   Eosinophils Relative 5.0 0.0 - 5.0 %   Basophils Relative 1.4 0.0 - 3.0 %   Neutro Abs 2.5 1.4 - 7.7 K/uL   Lymphs Abs 1.5 0.7 - 4.0 K/uL   Monocytes Absolute 0.6 0.1 - 1.0 K/uL   Eosinophils Absolute 0.2 0.0 - 0.7 K/uL   Basophils Absolute 0.1 0.0 - 0.1 K/uL  Comprehensive metabolic panel  Result Value Ref Range   Sodium 140 135 - 145 mEq/L   Potassium 4.0 3.5 - 5.1 mEq/L   Chloride 104 96 - 112 mEq/L   CO2 30 19 - 32 mEq/L   Glucose, Bld 94 70 - 99 mg/dL   BUN 10 6 - 23 mg/dL   Creatinine, Ser 1.00 0.40 - 1.50 mg/dL   Total Bilirubin 0.9 0.2 - 1.2 mg/dL   Alkaline Phosphatase 81 39 - 117 U/L   AST 11 0 - 37 U/L   ALT 10 0 - 53 U/L   Total Protein 6.0 6.0 - 8.3 g/dL   Albumin 3.9 3.5 - 5.2 g/dL   Calcium  9.2 8.4 - 10.5 mg/dL   GFR 94.95 >60.00 mL/min  Lipid panel  Result Value Ref Range   Cholesterol 166 0 - 200 mg/dL   Triglycerides 80.0 0.0 - 149.0 mg/dL   HDL 36.60 (L) >39.00 mg/dL   VLDL 16.0 0.0 - 40.0 mg/dL   LDL Cholesterol 113 (H) 0 - 99 mg/dL   Total CHOL/HDL Ratio 5    NonHDL 129.25       Assessment & Plan:   Problem List Items Addressed This Visit    Acquired elevated diaphragm    Chronic. Update films.       Relevant Orders   DG Chest 2 View   Advanced care planning/counseling discussion    Advanced directives: has living will at home for himself and wife. HCPOA - son. Asked to bring copy. Doesn't want prolonged life support. Would want sons to make decision for temporary life support.       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       COPD, severe ([place])    Chronic, stable on spiriva without respiratory symptoms.       Relevant Medications   fluticasone (FLONASE) 50 MCG/ACT nasal spray   Other Relevant Orders   DG Chest 2 View   Encounter for general adult medical examination with abnormal findings - Primary    Preventative protocols reviewed and updated unless pt declined. Discussed healthy diet and lifestyle.       HYPERCHOLESTEROLEMIA, PURE    Chronic, stable. Continue current regimen.  The 10-year ASCVD risk score Mikey Bussing DC Brooke Bonito., et al., 2013) is: 31.6%   Values used to calculate the score:     Age: 37 years     Sex: Male     Is Non-Hispanic African American: Yes     Diabetic: No     Tobacco smoker: Yes     Systolic Blood Pressure: 626 mmHg     Is BP treated: Yes     HDL Cholesterol: 36.6 mg/dL  Total Cholesterol: 166 mg/dL       Relevant Medications   amLODipine (NORVASC) 10 MG tablet   pravastatin (PRAVACHOL) 40 MG tablet   HYPERTENSION, BENIGN ESSENTIAL    Chronic, stable. Continue current regimen.       Relevant Medications   amLODipine (NORVASC) 10 MG tablet   pravastatin (PRAVACHOL) 40 MG tablet   Obesity,  Class I, BMI 30-34.9    Discussed healthy diet and lifestyle changes to affect sustainable weight loss. Encouraged regular walking regimen.       TOBACCO ABUSE    Encourage continued cessation attempts.  Not eligible for lung cancer screening (~25 PY hx)      Relevant Orders   DG Chest 2 View       Meds ordered this encounter  Medications  . fluticasone (FLONASE) 50 MCG/ACT nasal spray    Sig: Place 2 sprays into both nostrils daily.    Dispense:  48 g    Refill:  3  . amLODipine (NORVASC) 10 MG tablet    Sig: TAKE 1 TABLET (10 MG TOTAL) BY MOUTH DAILY.    Dispense:  90 tablet    Refill:  3  . pravastatin (PRAVACHOL) 40 MG tablet    Sig: TAKE 1 TABLET (40 MG TOTAL) BY MOUTH DAILY.    Dispense:  90 tablet    Refill:  3   Orders Placed This Encounter  Procedures  . DG Chest 2 View    Standing Status:   Future    Standing Expiration Date:   03/26/2019    Order Specific Question:   Reason for Exam (SYMPTOM  OR DIAGNOSIS REQUIRED)    Answer:   f/u R hemidiaphragm elevation    Order Specific Question:   Preferred imaging location?    Answer:   [facility]    Order Specific Question:   Radiology Contrast Protocol - do NOT remove file path    Answer:   [email or web address]    Follow up plan: Return in about 1 year (around 01/25/2019).  Ria Bush, MD

## 2018-01-25 NOTE — Assessment & Plan Note (Signed)
Chronic, stable. Continue current regimen.  The 10-year ASCVD risk score Mikey Bussing DC Brooke Bonito., et al., 2013) is: 31.6%   Values used to calculate the score:     Age: 72 years     Sex: Male     Is Non-Hispanic African American: Yes     Diabetic: No     Tobacco smoker: Yes     Systolic Blood Pressure: 003 mmHg     Is BP treated: Yes     HDL Cholesterol: 36.6 mg/dL     Total Cholesterol: 166 mg/dL

## 2018-01-25 NOTE — Assessment & Plan Note (Addendum)
Advanced directives: has living will at home for himself and wife. HCPOA - son. Asked to bring copy. Doesn't want prolonged life support. Would want sons to make decision for temporary life support.

## 2018-01-25 NOTE — Assessment & Plan Note (Signed)
Chronic, stable. Continue current regimen. 

## 2018-01-25 NOTE — Assessment & Plan Note (Signed)
Chronic, stable. Presumed dx.

## 2018-01-25 NOTE — Assessment & Plan Note (Signed)
Preventative protocols reviewed and updated unless pt declined. Discussed healthy diet and lifestyle.  

## 2018-01-25 NOTE — Assessment & Plan Note (Signed)
Chronic, stable on spiriva without respiratory symptoms.

## 2018-01-25 NOTE — Assessment & Plan Note (Signed)
Discussed healthy diet and lifestyle changes to affect sustainable weight loss. Encouraged regular walking regimen.

## 2018-01-25 NOTE — Assessment & Plan Note (Signed)
Chronic. Update films.

## 2018-01-26 ENCOUNTER — Other Ambulatory Visit: Payer: Self-pay | Admitting: Family Medicine

## 2018-01-26 DIAGNOSIS — J986 Disorders of diaphragm: Secondary | ICD-10-CM

## 2018-01-27 NOTE — Progress Notes (Signed)
I reviewed health advisor's note, was available for consultation, and agree with documentation and plan.  

## 2018-02-26 DIAGNOSIS — H5203 Hypermetropia, bilateral: Secondary | ICD-10-CM | POA: Diagnosis not present

## 2018-02-26 DIAGNOSIS — H52223 Regular astigmatism, bilateral: Secondary | ICD-10-CM | POA: Diagnosis not present

## 2018-02-26 DIAGNOSIS — H524 Presbyopia: Secondary | ICD-10-CM | POA: Diagnosis not present

## 2018-02-26 DIAGNOSIS — H11423 Conjunctival edema, bilateral: Secondary | ICD-10-CM | POA: Diagnosis not present

## 2018-07-24 ENCOUNTER — Ambulatory Visit: Payer: Self-pay | Admitting: *Deleted

## 2018-07-24 NOTE — Telephone Encounter (Signed)
Pt called to access symptoms; he states that he bought a partial and it broke off; he thinks he must have swallowed it when he was eating this morning; recommendations made per nurse triage protocol home care advice; he verbalizes understanding; will route to office for notification of this encounter.   Reason for Disposition . Harmless swallowed FB and no symptoms  Answer Assessment - Initial Assessment Questions 1. OBJECT: "What is it?"      Clip off of partial 2. SIZE: "How large is it?" (inches or cm, or compare it to standard coins)      Tip of pt's little finger  3. ONSET: "How long ago did you swallow it?" (e.g., minutes, hours)      07/24/18 at 0800 4. MECHANISM: "Tell me how it happened."      Thinks maybe he swallowed it when he was eating this morning 5. OTHER SYMPTOMS: "Are there any other symptoms?" (e.g., pain in neck or chest, difficulty breathing, difficulty swallowing)     none 6. PREGNANCY: "Is there any chance you are pregnant?" "When was your last menstrual period?"     n/a  Protocols used: SWALLOWED FOREIGN BODY-A-AH

## 2018-10-31 ENCOUNTER — Ambulatory Visit: Payer: Medicare Other | Admitting: Family Medicine

## 2018-10-31 ENCOUNTER — Encounter: Payer: Self-pay | Admitting: Family Medicine

## 2018-10-31 VITALS — BP 148/78 | HR 92 | Temp 98.1°F | Ht 69.0 in | Wt 237.2 lb

## 2018-10-31 DIAGNOSIS — J208 Acute bronchitis due to other specified organisms: Secondary | ICD-10-CM | POA: Diagnosis not present

## 2018-10-31 DIAGNOSIS — J449 Chronic obstructive pulmonary disease, unspecified: Secondary | ICD-10-CM | POA: Diagnosis not present

## 2018-10-31 DIAGNOSIS — R0981 Nasal congestion: Secondary | ICD-10-CM

## 2018-10-31 DIAGNOSIS — R059 Cough, unspecified: Secondary | ICD-10-CM

## 2018-10-31 DIAGNOSIS — R05 Cough: Secondary | ICD-10-CM

## 2018-10-31 MED ORDER — PREDNISONE 10 MG PO TABS: 10.0000 mg | ORAL_TABLET | Freq: Every day | ORAL | 0 refills | Status: AC

## 2018-10-31 MED ORDER — ALBUTEROL SULFATE HFA 108 (90 BASE) MCG/ACT IN AERS: INHALATION_SPRAY | RESPIRATORY_TRACT | 0 refills | Status: AC

## 2018-10-31 MED ORDER — ALBUTEROL SULFATE (2.5 MG/3ML) 0.083% IN NEBU: 2.5000 mg | INHALATION_SOLUTION | Freq: Once | RESPIRATORY_TRACT | Status: AC

## 2018-10-31 NOTE — Patient Instructions (Signed)
Continue to use Mucinex as you have been doing for cough and flonase for nasal congestion

## 2018-10-31 NOTE — Progress Notes (Signed)
Subjective:    Patient ID: Dominic Turner, male    DOB: 06/16/1946, 72 y.o.   MRN: [id number]  HPI  Presents to clinic c/o suspected bronchitis; has cough, congestion. Symptoms present x 1 to 2 days.  Patient states he started taking liquid Mucinex, and this has helped him be able to bring up some phlegm with his cough rather than it staying in chest.  States that the phlegm has been light yellow/white.  Also reports some nasal congestion and some sinus pressure for the past 1 to 2 days.  Patient does take a Flonase every day.  Denies any fever or chills.  Denies any nausea, vomiting or diarrhea.   Patient does have COPD and he takes Spiriva every morning.  Patient Active Problem List   Diagnosis Date Noted  . 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 Nocturia 04/30/2017  . Seborrheic dermatitis of scalp 01/20/2017  . 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 Advanced care planning/counseling discussion 01/15/2015  . Encounter for general adult medical examination with abnormal findings 01/15/2015  . COPD, severe ([place]) 04/09/2014  . 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 Medicare annual wellness visit, subsequent 12/27/2012  . Hearing loss 12/27/2012  . Tinea capitis 04/17/2012  . ERECTILE DYSFUNCTION 03/25/2009  . OBSTRUCTIVE SLEEP APNEA 06/26/2008  . GASTRITIS 10/09/2007  . COLONIC POLYPS, HX OF 10/09/2007  . PROSTATITIS, HX OF 10/09/2007  . HYPERCHOLESTEROLEMIA, PURE 05/03/2007  . Obesity, Class I, BMI 30-34.9 05/03/2007  . TOBACCO ABUSE 05/03/2007  . HYPERTENSION, BENIGN ESSENTIAL 05/03/2007   Social History   Tobacco Use  . Smoking status: Current Every Day Smoker    Packs/day: 0.50    Years: 48.00    Pack years: 24.00    Types: Cigarettes    Start date: 12/12/1983  . Smokeless tobacco: Never Used  Substance Use Topics  . Alcohol use: No    Alcohol/week: 0.0 standard drinks   Review of Systems   Constitutional: Negative for  chills, fatigue and fever.  HENT: +sinus congestion/pressure.   Eyes: Negative.   Respiratory: +cough, chest congestion.  Cardiovascular: Negative for chest pain, palpitations and leg swelling.  Gastrointestinal: Negative for abdominal pain, diarrhea, nausea and vomiting.  Genitourinary: Negative for dysuria, frequency and urgency.  Musculoskeletal: Negative for arthralgias and myalgias.  Skin: Negative for color change, pallor and rash.  Neurological: Negative for syncope, light-headedness and headaches.  Psychiatric/Behavioral: The patient is not nervous/anxious.       Objective:   Physical Exam  Constitutional: He is oriented to person, place, and time. He appears well-nourished. No distress.  HENT:  Head: Normocephalic and atraumatic.  Eyes: Conjunctivae and EOM are normal. No scleral icterus.  Neck: Neck supple. No JVD present. No tracheal deviation present.  Cardiovascular: Normal rate and regular rhythm.  Pulmonary/Chest: Effort normal. No respiratory distress. He has wheezes (faint, upper lobes. Some rhonchi upper lobes).  Lymphadenopathy:    He has no cervical adenopathy.  Neurological: He is alert and oriented to person, place, and time.  Skin: Skin is warm and dry. He is not diaphoretic. No pallor.  Psychiatric: He has a normal mood and affect. His behavior is normal.  Nursing note and vitals reviewed.     Vitals:   10/31/18 1013  BP: (!) 148/78  Pulse: 92  Temp: 98.1 F (36.7 C)  SpO2: 96%   Assessment & Plan:   Viral bronchitis, COPD, cough- explained to patient that his symptoms been present for 1 to  2 days most likely makes a viral in nature.  He will be given albuterol treatment x1 in clinic.  He will take steroid burst, and use albuterol inhaler at home.  Patient encouraged to continue using his Mucinex cough syrup as this has already been effective for him.  Offered chest x-ray today in clinic, but after discussing with patient we both agreed that we will not  do chest x-ray today and he will get chest x-ray if his symptoms persist longer than 7 to 10 days.  Nasal congestion-patient will continue using his Flonase nasal spray, also suggest that he try saline nasal rinses as these can be quite effective for congestion improvement.  Keep regularly scheduled appointment with PCP as planned.  Return to clinic sooner if any issues arise.

## 2019-02-03 ENCOUNTER — Other Ambulatory Visit: Payer: Self-pay | Admitting: Family Medicine

## 2019-02-03 DIAGNOSIS — Z125 Encounter for screening for malignant neoplasm of prostate: Secondary | ICD-10-CM

## 2019-02-03 DIAGNOSIS — E7800 Pure hypercholesterolemia, unspecified: Secondary | ICD-10-CM

## 2019-02-03 DIAGNOSIS — L930 Discoid lupus erythematosus: Secondary | ICD-10-CM

## 2019-02-03 DIAGNOSIS — I10 Essential (primary) hypertension: Secondary | ICD-10-CM

## 2019-02-03 DIAGNOSIS — D563 Thalassemia minor: Secondary | ICD-10-CM

## 2019-02-04 ENCOUNTER — Ambulatory Visit (INDEPENDENT_AMBULATORY_CARE_PROVIDER_SITE_OTHER): Payer: Medicare Other

## 2019-02-04 VITALS — BP 138/74 | HR 75 | Temp 97.6°F | Ht 69.5 in | Wt 236.8 lb

## 2019-02-04 DIAGNOSIS — E7800 Pure hypercholesterolemia, unspecified: Secondary | ICD-10-CM | POA: Diagnosis not present

## 2019-02-04 DIAGNOSIS — D563 Thalassemia minor: Secondary | ICD-10-CM | POA: Diagnosis not present

## 2019-02-04 DIAGNOSIS — Z125 Encounter for screening for malignant neoplasm of prostate: Secondary | ICD-10-CM

## 2019-02-04 DIAGNOSIS — I10 Essential (primary) hypertension: Secondary | ICD-10-CM

## 2019-02-04 DIAGNOSIS — L930 Discoid lupus erythematosus: Secondary | ICD-10-CM

## 2019-02-04 DIAGNOSIS — Z0000 Encounter for general adult medical examination without abnormal findings: Secondary | ICD-10-CM

## 2019-02-04 LAB — COMPREHENSIVE METABOLIC PANEL
ALT: 10 U/L (ref 0–53)
AST: 12 U/L (ref 0–37)
Albumin: 4.1 g/dL (ref 3.5–5.2)
Alkaline Phosphatase: 97 U/L (ref 39–117)
BILIRUBIN TOTAL: 0.7 mg/dL (ref 0.2–1.2)
BUN: 7 mg/dL (ref 6–23)
CO2: 27 meq/L (ref 19–32)
CREATININE: 1 mg/dL (ref 0.40–1.50)
Calcium: 9.5 mg/dL (ref 8.4–10.5)
Chloride: 101 mEq/L (ref 96–112)
GFR: 89.07 mL/min (ref >60.00)
GLUCOSE: 80 mg/dL (ref 70–99)
Potassium: 3.7 mEq/L (ref 3.5–5.1)
Sodium: 138 mEq/L (ref 135–145)
TOTAL PROTEIN: 6.1 g/dL (ref 6.0–8.3)

## 2019-02-04 LAB — CBC WITH DIFFERENTIAL/PLATELET
BASOS ABS: 0.1 10*3/uL (ref 0.0–0.1)
Basophils Relative: 1.6 % (ref 0.0–3.0)
EOS ABS: 0.2 10*3/uL (ref 0.0–0.7)
Eosinophils Relative: 4.2 % (ref 0.0–5.0)
HEMATOCRIT: 48 % (ref 39.0–52.0)
Hemoglobin: 15.5 g/dL (ref 13.0–17.0)
LYMPHS ABS: 2 10*3/uL (ref 0.7–4.0)
LYMPHS PCT: 38.6 % (ref 12.0–46.0)
MCHC: 32.2 g/dL (ref 30.0–36.0)
MCV: 71.7 fl — ABNORMAL LOW (ref 78.0–100.0)
Monocytes Absolute: 0.6 10*3/uL (ref 0.1–1.0)
Monocytes Relative: 11.7 % (ref 3.0–12.0)
NEUTROS ABS: 2.2 10*3/uL (ref 1.4–7.7)
NEUTROS PCT: 43.9 % (ref 43.0–77.0)
PLATELETS: 191 10*3/uL (ref 150.0–400.0)
RBC: 6.7 Mil/uL — AB (ref 4.22–5.81)
RDW: 21.5 % — ABNORMAL HIGH (ref 11.5–15.5)
WBC: 5.1 10*3/uL (ref 4.0–10.5)

## 2019-02-04 LAB — MICROALBUMIN / CREATININE URINE RATIO
CREATININE, U: 198.8 mg/dL
MICROALB UR: 0.8 mg/dL (ref 0.0–1.9)
MICROALB/CREAT RATIO: 0.4 mg/g (ref 0.0–30.0)

## 2019-02-04 LAB — LIPID PANEL
CHOL/HDL RATIO: 4
Cholesterol: 159 mg/dL (ref 0–200)
HDL: 43.3 mg/dL (ref >39.00)
LDL Cholesterol: 97 mg/dL (ref 0–99)
NONHDL: 116.15
Triglycerides: 98 mg/dL (ref 0.0–149.0)
VLDL: 19.6 mg/dL (ref 0.0–40.0)

## 2019-02-04 LAB — PSA, MEDICARE: PSA: 0.84 ng/ml (ref 0.10–4.00)

## 2019-02-04 NOTE — Progress Notes (Signed)
PCP notes:   Health maintenance:  Tetanus vaccine - postponed/insurance  Abnormal screenings:   Hearing - failed  Hearing Screening   125Hz  250Hz  500Hz  1000Hz  2000Hz  3000Hz  4000Hz  6000Hz  8000Hz   Right ear:   40 40 0  0    Left ear:   40 40 0  0     Patient concerns:   Rash on left side of cheek.  Nurse concerns:  None  Next PCP appt:   02/05/19 @ 1030

## 2019-02-04 NOTE — Progress Notes (Signed)
Subjective:   Dominic Turner is a 73 y.o. male who presents for Medicare Annual/Subsequent preventive examination.  Review of Systems:  N/A Cardiac Risk Factors include: advanced age (>12men, >30 women);male gender;smoking/ tobacco exposure;hypertension;obesity (BMI >30kg/m2)     Objective:    Vitals: BP 138/74 (BP Location: Right Arm, Patient Position: Sitting, Cuff Size: Normal)   Pulse 75   Temp 97.6 F (36.4 C) (Oral)   Ht 5' 9.5" (1.765 m) Comment: shoes  Wt 236 lb 12.8 oz (107.4 kg)   SpO2 96%   BMI 34.47 kg/m   Body mass index is 34.47 kg/m.  Advanced Directives 02/04/2019 01/24/2018 12/30/2015  Does Patient Have a Medical Advance Directive? Yes Yes Yes  Type of Paramedic of [place];Living will [place];Living will [place];Living will  Does patient want to make changes to medical advance directive? - - No - Patient declined  Copy of [place] in Chart? No - copy requested No - copy requested -    Tobacco Social History   Tobacco Use  Smoking Status Current Every Day Smoker  . Packs/day: 0.50  . Years: 48.00  . Pack years: 24.00  . Types: Cigarettes  . Start date: 12/12/1983  Smokeless Tobacco Never Used     Ready to quit: No Counseling given: No   Clinical Intake:  Pre-visit preparation completed: Yes  Pain : No/denies pain Pain Score: 0-No pain     Nutritional Status: BMI > 30  Obese Nutritional Risks: None Diabetes: No  How often do you need to have someone help you when you read instructions, pamphlets, or other written materials from your doctor or pharmacy?: 1 - Never What is the last grade level you completed in school?: 12th grade  Interpreter Needed?: No  Comments: pt is married but lives alone; spouse is in SNF Information entered by :: LPinson, LPN  Past Medical History:  Diagnosis Date  . Acute cholecystitis 12/2015  . ANXIETY DEPRESSION  10/23/2008  . 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 Bilateral high frequency sensorineural hearing loss    rec hearing aides by ENT  . COPD, severe ([place]) 03/2014   Moderately severe obstruction, with low vital capacity. Post bronchodilator test not improved.  . Diverticulosis 02/2013   by colonoscopy  . Essential hypertension, benign   . History of adenomatous polyp of colon 02/2013   rec rpt 3 yrs  . Overweight(278.02)   . Positive PPD, treated 2014   s/p rifampin x82mo  . Pure hypercholesterolemia   . Tobacco use disorder   . Unspecified gastritis and gastroduodenitis without mention of hemorrhage   . Unspecified sleep apnea    Past Surgical History:  Procedure Laterality Date  . CATARACT EXTRACTION, BILATERAL Bilateral   . CHOLECYSTECTOMY N/A 12/30/2015   Procedure: LAPAROSCOPIC CHOLECYSTECTOMY;  Surgeon: Florene Glen, MD;  Location: [facility] ORS;  Service: General;  Laterality: N/A;  . COLONOSCOPY  03/03/2013   tubular adenoma x3, diverticulosis, rpt 3 yrs (Dr. Cristina Gong)  . COLONOSCOPY  05/2016   TA x3, diverticulosis, single angiodysplastic lesion, rpt 3 yrs (Buccini)   Family History  Problem Relation Age of Onset  . Cirrhosis Father        died in 5's  . Hypertension Mother   . Heart attack Sister   . Stroke Sister   . Heart attack Maternal Grandmother   . Stroke Maternal Grandmother   .  Thyroid disease Sister   . Diabetes Sister   . Cancer Neg Hx    Social History   Socioeconomic History  . Marital status: Married    Spouse name: Not on file  . Number of children: 2  . Years of education: Not on file  . Highest education level: Not on file  Occupational History  . Occupation: truck Animator Needs  . Financial resource strain: Not on file  . Food insecurity:    Worry: Not on file    Inability: Not on file  . Transportation needs:    Medical: Not on file    Non-medical: Not on file  Tobacco Use  .  Smoking status: Current Every Day Smoker    Packs/day: 0.50    Years: 48.00    Pack years: 24.00    Types: Cigarettes    Start date: 12/12/1983  . Smokeless tobacco: Never Used  Substance and Sexual Activity  . Alcohol use: No    Alcohol/week: 0.0 standard drinks  . Drug use: No  . Sexual activity: Never  Lifestyle  . Physical activity:    Days per week: Not on file    Minutes per session: Not on file  . Stress: Not on file  Relationships  . Social connections:    Talks on phone: Not on file    Gets together: Not on file    Attends religious service: Not on file    Active member of club or organization: Not on file    Attends meetings of clubs or organizations: Not on file    Relationship status: Not on file  Other Topics Concern  . Not on file  Social History Narrative   Lives alone. Wife lives at [facility] in [place] in [place] (memory unit). Sees wife daily.    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 acetaminophen (TYLENOL) 500 MG tablet Take 500 mg by mouth every 6 (six) hours as needed.  Marland Kitchen albuterol (PROVENTIL HFA;VENTOLIN HFA) 108 (90 Base) MCG/ACT inhaler Take 2 puffs in AM & PM every day for next 5 days, & also can use every 4-6 hours as needed. After 5 days, use only every 4-6 hrs as needed  . amLODipine (NORVASC) 10 MG tablet TAKE 1 TABLET (10 MG TOTAL) BY MOUTH DAILY.  Marland Kitchen aspirin 81 MG tablet Take 81 mg by mouth daily.    . clobetasol (TEMOVATE) 0.05 % external solution Apply 1 application topically.  Mariane Baumgarten Calcium (STOOL SOFTENER PO) Take 1 tablet by mouth daily.  . fluticasone (FLONASE) 50 MCG/ACT nasal spray Place 2 sprays into both nostrils daily.  Marland Kitchen ibuprofen (ADVIL,MOTRIN) 600 MG tablet Take 1 tablet (600 mg total) by mouth 3 (three) times daily as needed. (Patient taking  differently: Take 600 mg by mouth daily as needed. )  . Multiple Vitamin Essential TABS Take 1 tablet by mouth daily.    . pravastatin (PRAVACHOL) 40 MG tablet TAKE 1 TABLET (40 MG TOTAL) BY MOUTH DAILY.  . tamsulosin (FLOMAX) 0.4 MG CAPS capsule TAKE 1 CAPSULE BY MOUTH EVERY DAY  . tiotropium (SPIRIVA HANDIHALER) 18 MCG inhalation capsule Place 1 capsule (18 mcg total) into inhaler and inhale at bedtime.  . triamcinolone cream (KENALOG) 0.1 % Apply 1 application topically 2 (two) times daily.  Facility-Administered Encounter Medications as of 02/04/2019  Medication  . albuterol (PROVENTIL) (2.5 MG/3ML) 0.083% nebulizer solution 2.5 mg  . betamethasone acetate-betamethasone sodium phosphate (CELESTONE) injection 3 mg    Activities of Daily Living In your present state of health, do you have any difficulty performing the following activities: 02/04/2019  Hearing? N  Vision? N  Difficulty concentrating or making decisions? N  Walking or climbing stairs? N  Dressing or bathing? N  Doing errands, shopping? N  Preparing Food and eating ? N  Using the Toilet? N  In the past six months, have you accidently leaked urine? N  Do you have problems with loss of bowel control? N  Managing your Medications? N  Managing your Finances? N  Housekeeping or managing your Housekeeping? N  Some recent data might be hidden    Patient Care Team: Ria Bush, MD as PCP - General   Assessment:   This is a routine wellness examination for [place].   Hearing Screening   125Hz  250Hz  500Hz  1000Hz  2000Hz  3000Hz  4000Hz  6000Hz  8000Hz   Right ear:   40 40 0  0    Left ear:   40 40 0  0    Vision Screening Comments: Vision exam in 2019    Exercise Activities and Dietary recommendations Current Exercise Habits: Home exercise routine, Type of exercise: walking, Time (Minutes): 30, Frequency (Times/Week): 7, Weekly Exercise (Minutes/Week): 210, Intensity: Mild, Exercise limited by: None identified  Goals     . Follow up with Primary Care Provider     Starting 02/04/19, I will continue to take medications as prescribed and to keep appointments with PCP as scheduled.        Fall Risk Fall Risk  02/04/2019 01/24/2018 01/19/2017 01/18/2016 01/15/2015  Falls in the past year? 0 No No No No   Depression Screen PHQ 2/9 Scores 02/04/2019 01/24/2018 01/19/2017 01/18/2016  PHQ - 2 Score 0 0 0 0  PHQ- 9 Score 0 0 - -    Cognitive Function MMSE - Mini Mental State Exam 02/04/2019 01/24/2018  Orientation to time 5 5  Orientation to Place 5 5  Registration 3 3  Attention/ Calculation 0 0  Recall 3 2  Recall-comments - unable to recall 1 of 3 words  Language- name 2 objects 0 0  Language- repeat 1 1  Language- follow 3 step command 3 1  Language- follow 3 step command-comments - unable to follow 2 steps of 3 step command  Language- read & follow direction 0 0  Write a sentence 0 0  Copy design 0 0  Total score 20 17     PLEASE NOTE: A Mini-Cog screen was completed. Maximum score is 20. A value of 0 denotes this part of [place] MMSE was not completed or the patient failed this part of the Mini-Cog screening.   Mini-Cog Screening Orientation to Time - Max 5 pts Orientation to Place - Max 5 pts Registration - Max 3 pts Recall - Max 3 pts Language Repeat - Max 1 pts Language Follow 3 Step Command - Max 3 pts     Immunization History  Administered Date(s) Administered  . Influenza Split 11/15/2012  . Influenza Whole 12/06/1999, 10/11/2013  . Influenza, High Dose Seasonal PF 09/12/2015, 08/11/2018  . Influenza,inj,Quad PF,6+ Mos 10/02/2014, 09/14/2017  . Influenza-Unspecified 09/18/2016  . PPD Test 02/07/2013  . Pneumococcal Conjugate-13 01/15/2015  . Pneumococcal Polysaccharide-23 12/05/2002, 06/26/2008, 01/09/2014  . Td 03/11/1998, 06/26/2008  . Zoster 10/11/2013    Screening Tests  Health Maintenance  Topic Date Due  . DTaP/Tdap/Td (1 - Tdap) 12/10/2020 (Originally 11/08/1957)  .  TETANUS/TDAP  12/10/2020 (Originally 06/26/2018)  . COLONOSCOPY  05/12/2019  . INFLUENZA VACCINE  Completed  . Hepatitis C Screening  Completed  . PNA vac Low Risk Adult  Completed      Plan:   I have personally reviewed, addressed, and noted the following in the patient's chart:  A. Medical and social history B. Use of alcohol, tobacco or illicit drugs  C. Current medications and supplements D. Functional ability and status E.  Nutritional status F.  Physical activity G. Advance directives H. List of other physicians I.  Hospitalizations, surgeries, and ER visits in previous 12 months J.  [place] to include hearing, vision, cognitive, depression L. Referrals and appointments - none  In addition, I have reviewed and discussed with patient certain preventive protocols, quality metrics, and best practice recommendations. A written personalized care plan for preventive services as well as general preventive health recommendations were provided to patient.  See attached scanned questionnaire for additional information.   Signed,   Lindell Noe, MHA, BS, LPN Health Coach

## 2019-02-04 NOTE — Patient Instructions (Signed)
Mr. Trumbull , Thank you for taking time to come for your Medicare Wellness Visit. I appreciate your ongoing commitment to your health goals. Please review the following plan we discussed and let me know if I can assist you in the future.   These are the goals we discussed: Goals    . Follow up with Primary Care Provider     Starting 02/04/19, I will continue to take medications as prescribed and to keep appointments with PCP as scheduled.        This is a list of the screening recommended for you and due dates:  Health Maintenance  Topic Date Due  . DTaP/Tdap/Td vaccine (1 - Tdap) 12/10/2020*  . Tetanus Vaccine  12/10/2020*  . Colon Cancer Screening  05/12/2019  . Flu Shot  Completed  .  Hepatitis C: One time screening is recommended by Center for Disease Control  (CDC) for  adults born from [birth year] through 1964.   Completed  . Pneumonia vaccines  Completed  *Topic was postponed. The date shown is not the original due date.   Preventive Care for Adults  A healthy lifestyle and preventive care can promote health and wellness. Preventive health guidelines for adults include the following key practices.  . A routine yearly physical is a good way to check with your health care provider about your health and preventive screening. It is a chance to share any concerns and updates on your health and to receive a thorough exam.  . Visit your dentist for a routine exam and preventive care every 6 months. Brush your teeth twice a day and floss once a day. Good oral hygiene prevents tooth decay and gum disease.  . The frequency of eye exams is based on your age, health, family medical history, use  of contact lenses, and other factors. Follow your health care provider's recommendations for frequency of eye exams.  . Eat a healthy diet. Foods like vegetables, fruits, whole grains, low-fat dairy products, and lean protein foods contain the nutrients you need without too many calories. Decrease  your intake of foods high in solid fats, added sugars, and salt. Eat the right amount of calories for you. Get information about a proper diet from your health care provider, if necessary.  . Regular physical exercise is one of the most important things you can do for your health. Most adults should get at least 150 minutes of moderate-intensity exercise (any activity that increases your heart rate and causes you to sweat) each week. In addition, most adults need muscle-strengthening exercises on 2 or more days a week.  Silver Sneakers may be a benefit available to you. To determine eligibility, you may visit the website: www.silversneakers.com or contact program at 561-114-0801 Mon-Fri between 8AM-8PM.   . Maintain a healthy weight. The body mass index (BMI) is a screening tool to identify possible weight problems. It provides an estimate of body fat based on height and weight. Your health care provider can find your BMI and can help you achieve or maintain a healthy weight.   For adults 20 years and older: ? A BMI below 18.5 is considered underweight. ? A BMI of 18.5 to 24.9 is normal. ? A BMI of 25 to 29.9 is considered overweight. ? A BMI of 30 and above is considered obese.   . Maintain normal blood lipids and cholesterol levels by exercising and minimizing your intake of saturated fat. Eat a balanced diet with plenty of fruit and vegetables. Blood tests for  lipids and cholesterol should begin at age 69 and be repeated every 5 years. If your lipid or cholesterol levels are high, you are over 50, or you are at high risk for heart disease, you may need your cholesterol levels checked more frequently. Ongoing high lipid and cholesterol levels should be treated with medicines if diet and exercise are not working.  . If you smoke, find out from your health care provider how to quit. If you do not use tobacco, please do not start.  . If you choose to drink alcohol, please do not consume more than  2 drinks per day. One drink is considered to be 12 ounces (355 mL) of beer, 5 ounces (148 mL) of wine, or 1.5 ounces (44 mL) of liquor.  . If you are 85-45 years old, ask your health care provider if you should take aspirin to prevent strokes.  . Use sunscreen. Apply sunscreen liberally and repeatedly throughout the day. You should seek shade when your shadow is shorter than you. Protect yourself by wearing long sleeves, pants, a wide-brimmed hat, and sunglasses year round, whenever you are outdoors.  . Once a month, do a whole body skin exam, using a mirror to look at the skin on your back. Tell your health care provider of new moles, moles that have irregular borders, moles that are larger than a pencil eraser, or moles that have changed in shape or color.

## 2019-02-05 ENCOUNTER — Ambulatory Visit (INDEPENDENT_AMBULATORY_CARE_PROVIDER_SITE_OTHER): Payer: Medicare Other | Admitting: Family Medicine

## 2019-02-05 ENCOUNTER — Encounter: Payer: Self-pay | Admitting: Family Medicine

## 2019-02-05 ENCOUNTER — Telehealth: Payer: Self-pay | Admitting: Family Medicine

## 2019-02-05 VITALS — BP 124/72 | HR 87 | Temp 98.2°F | Ht 69.5 in | Wt 238.0 lb

## 2019-02-05 DIAGNOSIS — F17200 Nicotine dependence, unspecified, uncomplicated: Secondary | ICD-10-CM

## 2019-02-05 DIAGNOSIS — J986 Disorders of diaphragm: Secondary | ICD-10-CM

## 2019-02-05 DIAGNOSIS — D563 Thalassemia minor: Secondary | ICD-10-CM

## 2019-02-05 DIAGNOSIS — L930 Discoid lupus erythematosus: Secondary | ICD-10-CM | POA: Diagnosis not present

## 2019-02-05 DIAGNOSIS — Z0000 Encounter for general adult medical examination without abnormal findings: Secondary | ICD-10-CM | POA: Diagnosis not present

## 2019-02-05 DIAGNOSIS — Z7189 Other specified counseling: Secondary | ICD-10-CM

## 2019-02-05 DIAGNOSIS — E7800 Pure hypercholesterolemia, unspecified: Secondary | ICD-10-CM | POA: Diagnosis not present

## 2019-02-05 DIAGNOSIS — I10 Essential (primary) hypertension: Secondary | ICD-10-CM

## 2019-02-05 DIAGNOSIS — J449 Chronic obstructive pulmonary disease, unspecified: Secondary | ICD-10-CM

## 2019-02-05 DIAGNOSIS — E669 Obesity, unspecified: Secondary | ICD-10-CM

## 2019-02-05 MED ORDER — FLUTICASONE PROPIONATE 50 MCG/ACT NA SUSP: 2.0000 | Freq: Every day | NASAL | 3 refills | Status: DC

## 2019-02-05 MED ORDER — TIOTROPIUM BROMIDE MONOHYDRATE 18 MCG IN CAPS: 18.0000 ug | ORAL_CAPSULE | Freq: Every day | RESPIRATORY_TRACT | 3 refills | Status: DC

## 2019-02-05 MED ORDER — PRAVASTATIN SODIUM 40 MG PO TABS: ORAL_TABLET | ORAL | 3 refills | Status: DC

## 2019-02-05 MED ORDER — CLOBETASOL PROPIONATE 0.05 % EX CREA: 1.0000 "application " | TOPICAL_CREAM | Freq: Two times a day (BID) | CUTANEOUS | 3 refills | Status: AC

## 2019-02-05 MED ORDER — AMLODIPINE BESYLATE 10 MG PO TABS: ORAL_TABLET | ORAL | 3 refills | Status: DC

## 2019-02-05 MED ORDER — TAMSULOSIN HCL 0.4 MG PO CAPS: 0.4000 mg | ORAL_CAPSULE | Freq: Every day | ORAL | 3 refills | Status: DC

## 2019-02-05 NOTE — Assessment & Plan Note (Addendum)
Discussed dx. Not on contributory medications. Clobetasol cream refilled. rec smoking cessation, consider vit D supplementation.

## 2019-02-05 NOTE — Progress Notes (Signed)
BP 124/72 (BP Location: Left Arm, Patient Position: Sitting, Cuff Size: Large)   Pulse 87   Temp 98.2 F (36.8 C) (Oral)   Ht 5' 9.5" (1.765 m)   Wt 238 lb (108 kg)   SpO2 96%   BMI 34.64 kg/m    CC: CPE Subjective:    Patient ID: Dominic Turner, male    DOB: 09-30-1946, 73 y.o.   MRN: [id number]  HPI: Dominic Turner is a 73 y.o. male presenting on 02/05/2019 for Annual Exam (Pt 2. Has questions about Tdap/Td vaccine.)    Saw Lesia this week for medicare wellness visit. Note reviewed.    Wife with dementia lives in [place].  Flonase helps am congestion  Recent dx discopid lupus L cheek - saw [facility] dermatology.   Smoking - 1/2 ppd. COPD - compliant with spiriva. Has tried nicorette gum. May consider nicoderm patch. Tried chantix - caused rash.  Preventative: COLONOSCOPY 05/2016;TA x3, diverticulosis, single angiodysplastic lesion, rpt 3 yrs (Buccini) Prostate - DRE normal2018, PSA reassuring. Declines further DRE  Lung cancer screening -referred 2018, not eligible, endorsed 24 PY hx. Flu shot yearly Pneumovax - 20009, 2015. prevnar 2016 Td 2009  zostavax - 10/2013  shingrix - discussed Advanced directives: has living will at home for himself and wife. HCPOA - son. Doesn't want prolonged life support. Would want sons to make decision for temporary life support. Asked to bring copy.  Seat belt use discussed Sunscreen use discussed.No changing moles in skin. Smoker - 1/2 ppd Alcohol - none Dentist q6 mo. Recent dental work.  Eye exam yearly   Lives alone. Wife lives at [facility] in [place] in [place] (memory unit). Sees wife daily.  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 Interim medical history since our last visit reviewed. Allergies and medications  reviewed and updated. Outpatient Medications Prior to Visit  Medication Sig Dispense Refill  . acetaminophen (TYLENOL) 500 MG tablet Take 500 mg by mouth every 6 (six) hours as needed.    Marland Kitchen albuterol (PROVENTIL HFA;VENTOLIN HFA) 108 (90 Base) MCG/ACT inhaler Take 2 puffs in AM & PM every day for next 5 days, & also can use every 4-6 hours as needed. After 5 days, use only every 4-6 hrs as needed 1 Inhaler 0  . aspirin 81 MG tablet Take 81 mg by mouth daily.      Mariane Baumgarten Calcium (STOOL SOFTENER PO) Take 1 tablet by mouth daily.    Marland Kitchen ibuprofen (ADVIL,MOTRIN) 600 MG tablet Take 1 tablet (600 mg total) by mouth 3 (three) times daily as needed. (Patient taking differently: Take 600 mg by mouth daily as needed. ) 20 tablet 0  . Multiple Vitamin Essential TABS Take 1 tablet by mouth daily.      Marland Kitchen amLODipine (NORVASC) 10 MG tablet TAKE 1 TABLET (10 MG TOTAL) BY MOUTH DAILY. 90 tablet 3  . clobetasol (TEMOVATE) 0.05 % external solution Apply 1 application topically.    . fluticasone (FLONASE) 50 MCG/ACT nasal spray Place 2 sprays into both nostrils daily. 48 g 3  . pravastatin (PRAVACHOL) 40 MG tablet TAKE 1 TABLET (40 MG TOTAL) BY MOUTH DAILY. 90 tablet 3  . tamsulosin (FLOMAX) 0.4 MG CAPS capsule TAKE 1 CAPSULE BY MOUTH EVERY DAY 90 capsule 1  . tiotropium (SPIRIVA HANDIHALER) 18 MCG inhalation capsule  Place 1 capsule (18 mcg total) into inhaler and inhale at bedtime. 90 capsule 3  . triamcinolone cream (KENALOG) 0.1 % Apply 1 application topically 2 (two) times daily. 30 g 0   Facility-Administered Medications Prior to Visit  Medication Dose Route Frequency Provider Last Rate Last Dose  . albuterol (PROVENTIL) (2.5 MG/3ML) 0.083% nebulizer solution 2.5 mg  2.5 mg Nebulization Once Guse, Jacquelynn Cree, FNP      . betamethasone acetate-betamethasone sodium phosphate (CELESTONE) injection 3 mg  3 mg Intramuscular Once Edrick Kins, DPM         Per HPI unless specifically indicated in ROS section  below Review of Systems  Constitutional: Negative for activity change, appetite change, chills, fatigue, fever and unexpected weight change.  HENT: Negative for hearing loss.   Eyes: Negative for visual disturbance.  Respiratory: Negative for cough, chest tightness, shortness of breath and wheezing.   Cardiovascular: Negative for chest pain, palpitations and leg swelling.  Gastrointestinal: Positive for constipation and diarrhea. Negative for abdominal distention, abdominal pain, blood in stool, nausea and vomiting.       IBS related bowels  Genitourinary: Negative for difficulty urinating and hematuria.  Musculoskeletal: Negative for arthralgias, myalgias and neck pain.  Skin: Negative for rash.  Neurological: Negative for dizziness, seizures, syncope and headaches.  Hematological: Negative for adenopathy. Does not bruise/bleed easily.  Psychiatric/Behavioral: Negative for dysphoric mood. The patient is not nervous/anxious.    Objective:    BP 124/72 (BP Location: Left Arm, Patient Position: Sitting, Cuff Size: Large)   Pulse 87   Temp 98.2 F (36.8 C) (Oral)   Ht 5' 9.5" (1.765 m)   Wt 238 lb (108 kg)   SpO2 96%   BMI 34.64 kg/m   Wt Readings from Last 3 Encounters:  02/05/19 238 lb (108 kg)  02/04/19 236 lb 12.8 oz (107.4 kg)  10/31/18 237 lb 3.2 oz (107.6 kg)    Physical Exam Vitals signs and nursing note reviewed.  Constitutional:      General: He is not in acute distress.    Appearance: Normal appearance. He is well-developed.  HENT:     Head: Normocephalic and atraumatic.     Right Ear: Hearing, tympanic membrane, ear canal and external ear normal.     Left Ear: Hearing, tympanic membrane, ear canal and external ear normal.     Nose: Nose normal.     Mouth/Throat:     Mouth: Mucous membranes are moist.     Pharynx: Uvula midline. No oropharyngeal exudate or posterior oropharyngeal erythema.  Eyes:     General: No scleral icterus.    Conjunctiva/sclera:  Conjunctivae normal.     Pupils: Pupils are equal, round, and reactive to light.  Neck:     Musculoskeletal: Normal range of motion and neck supple.     Vascular: No carotid bruit.  Cardiovascular:     Rate and Rhythm: Normal rate and regular rhythm.     Pulses: Normal pulses.          Radial pulses are 2+ on the right side and 2+ on the left side.     Heart sounds: Normal heart sounds. No murmur.  Pulmonary:     Effort: Pulmonary effort is normal. No respiratory distress.     Breath sounds: Examination of the right-lower field reveals decreased breath sounds. Decreased breath sounds present. No wheezing, rhonchi or rales.  Abdominal:     General: Abdomen is flat. Bowel sounds are normal. There is no distension.  Palpations: Abdomen is soft. There is no mass.     Tenderness: There is no abdominal tenderness. There is no guarding or rebound.  Musculoskeletal: Normal range of motion.  Lymphadenopathy:     Cervical: No cervical adenopathy.  Skin:    General: Skin is warm and dry.     Findings: Rash present.     Comments: Mild scarring rash bilateral cheeks  Neurological:     Mental Status: He is alert and oriented to person, place, and time.     Comments: CN grossly intact, station and gait intact  Psychiatric:        Behavior: Behavior normal.        Thought Content: Thought content normal.        Judgment: Judgment normal.       Results for orders placed or performed in visit on 02/04/19  PSA, Medicare  Result Value Ref Range   PSA 0.84 0.10 - 4.00 ng/ml  Microalbumin / creatinine urine ratio  Result Value Ref Range   Microalb, Ur 0.8 0.0 - 1.9 mg/dL   Creatinine,U 198.8 mg/dL   Microalb Creat Ratio 0.4 0.0 - 30.0 mg/g  CBC with Differential/Platelet  Result Value Ref Range   WBC 5.1 4.0 - 10.5 K/uL   RBC 6.70 (H) 4.22 - 5.81 Mil/uL   Hemoglobin 15.5 13.0 - 17.0 g/dL   HCT 48.0 39.0 - 52.0 %   MCV 71.7 (L) 78.0 - 100.0 fl   MCHC 32.2 30.0 - 36.0 g/dL   RDW 21.5  (H) 11.5 - 15.5 %   Platelets 191.0 150.0 - 400.0 K/uL   Neutrophils Relative % 43.9 43.0 - 77.0 %   Lymphocytes Relative 38.6 12.0 - 46.0 %   Monocytes Relative 11.7 3.0 - 12.0 %   Eosinophils Relative 4.2 0.0 - 5.0 %   Basophils Relative 1.6 0.0 - 3.0 %   Neutro Abs 2.2 1.4 - 7.7 K/uL   Lymphs Abs 2.0 0.7 - 4.0 K/uL   Monocytes Absolute 0.6 0.1 - 1.0 K/uL   Eosinophils Absolute 0.2 0.0 - 0.7 K/uL   Basophils Absolute 0.1 0.0 - 0.1 K/uL  Comprehensive metabolic panel  Result Value Ref Range   Sodium 138 135 - 145 mEq/L   Potassium 3.7 3.5 - 5.1 mEq/L   Chloride 101 96 - 112 mEq/L   CO2 27 19 - 32 mEq/L   Glucose, Bld 80 70 - 99 mg/dL   BUN 7 6 - 23 mg/dL   Creatinine, Ser 1.00 0.40 - 1.50 mg/dL   Total Bilirubin 0.7 0.2 - 1.2 mg/dL   Alkaline Phosphatase 97 39 - 117 U/L   AST 12 0 - 37 U/L   ALT 10 0 - 53 U/L   Total Protein 6.1 6.0 - 8.3 g/dL   Albumin 4.1 3.5 - 5.2 g/dL   Calcium 9.5 8.4 - 10.5 mg/dL   GFR 89.07 >60.00 mL/min  Lipid panel  Result Value Ref Range   Cholesterol 159 0 - 200 mg/dL   Triglycerides 98.0 0.0 - 149.0 mg/dL   HDL 43.30 >39.00 mg/dL   VLDL 19.6 0.0 - 40.0 mg/dL   LDL Cholesterol 97 0 - 99 mg/dL   Total CHOL/HDL Ratio 4    NonHDL 116.15    Assessment & Plan:   Problem List Items Addressed This Visit    TOBACCO ABUSE    Continue to encourage cessation. 1/2 ppd smoker.  ~26 PY hx      Obesity, Class I, BMI 30-34.9  Encouraged healthy diet and lifestyle changes to affect sustainable weight loss      HYPERTENSION, BENIGN ESSENTIAL    Chronic, stable. Continue current regimen.       Relevant Medications   amLODipine (NORVASC) 10 MG tablet   pravastatin (PRAVACHOL) 40 MG tablet   HYPERCHOLESTEROLEMIA, PURE    Chronic, stable. Continue pravastatin daily.  The 10-year ASCVD risk score Mikey Bussing DC Brooke Bonito., et al., 2013) is: 29.3%   Values used to calculate the score:     Age: 4 years     Sex: Male     Is Non-Hispanic African American: Yes      Diabetic: No     Tobacco smoker: Yes     Systolic Blood Pressure: 213 mmHg     Is BP treated: Yes     HDL Cholesterol: 43.3 mg/dL     Total Cholesterol: 159 mg/dL       Relevant Medications   amLODipine (NORVASC) 10 MG tablet   pravastatin (PRAVACHOL) 40 MG tablet   Health maintenance examination - Primary    Preventative protocols reviewed and updated unless pt declined. Discussed healthy diet and lifestyle.       Discoid lupus erythematosus    Discussed dx. Not on contributory medications. Clobetasol cream refilled. rec smoking cessation, consider vit D supplementation.       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 Consider periph smear/HgbEP      Advanced care planning/counseling discussion    Advanced directives: has living will at home for himself and wife. HCPOA - son. Doesn't want prolonged life support. Would want sons to make decision for temporary life support. Asked to bring copy.       Acquired elevated diaphragm    Chronic. Consider CT chest to eval phrenic nerve course.          Meds ordered this encounter  Medications  . amLODipine (NORVASC) 10 MG tablet    Sig: TAKE 1 TABLET (10 MG TOTAL) BY MOUTH DAILY.    Dispense:  90 tablet    Refill:  3  . fluticasone (FLONASE) 50 MCG/ACT nasal spray    Sig: Place 2 sprays into both nostrils daily.    Dispense:  48 g    Refill:  3  . pravastatin (PRAVACHOL) 40 MG tablet    Sig: TAKE 1 TABLET (40 MG TOTAL) BY MOUTH DAILY.    Dispense:  90 tablet    Refill:  3  . tamsulosin (FLOMAX) 0.4 MG CAPS capsule    Sig: Take 1 capsule (0.4 mg total) by mouth daily.    Dispense:  90 capsule    Refill:  3  . tiotropium (SPIRIVA HANDIHALER) 18 MCG inhalation capsule    Sig: Place 1  capsule (18 mcg total) into inhaler and inhale at bedtime.    Dispense:  90 capsule    Refill:  3  . clobetasol cream (TEMOVATE) 0.05 %    Sig: Apply 1 application topically 2 (two) times daily. Apply to AA for discoid lupus flare for 1 week at a time    Dispense:  30 g    Refill:  3   No orders of the defined types were placed in this encounter.   Follow up  plan: Return in about 1 year (around 02/06/2020) for medicare wellness visit, annual exam, prior fasting for blood work.  Ria Bush, MD

## 2019-02-05 NOTE — Assessment & Plan Note (Signed)
Chronic, stable. Continue pravastatin daily.  The 10-year ASCVD risk score Mikey Bussing DC Brooke Bonito., et al., 2013) is: 29.3%   Values used to calculate the score:     Age: 73 years     Sex: Male     Is Non-Hispanic African American: Yes     Diabetic: No     Tobacco smoker: Yes     Systolic Blood Pressure: 465 mmHg     Is BP treated: Yes     HDL Cholesterol: 43.3 mg/dL     Total Cholesterol: 159 mg/dL

## 2019-02-05 NOTE — Assessment & Plan Note (Signed)
Chronic. Consider CT chest to eval phrenic nerve course.

## 2019-02-05 NOTE — Assessment & Plan Note (Signed)
Preventative protocols reviewed and updated unless pt declined. Discussed healthy diet and lifestyle.  

## 2019-02-05 NOTE — Assessment & Plan Note (Addendum)
Advanced directives: has living will at home for himself and wife. HCPOA - son. Doesn't want prolonged life support. Would want sons to make decision for temporary life support.Asked to bring copy.  

## 2019-02-05 NOTE — Assessment & Plan Note (Signed)
Continue to encourage cessation. 1/2 ppd smoker.  ~26 PY hx

## 2019-02-05 NOTE — Assessment & Plan Note (Signed)
Chronic, stable. Continue current regimen. 

## 2019-02-05 NOTE — Assessment & Plan Note (Signed)
Microcytosis noted since 2014 CBC. Presumed dx. Consider periph smear/HgbEP

## 2019-02-05 NOTE — Telephone Encounter (Signed)
Pt discussed at wellness visit today with Dr. Darnell Level.

## 2019-02-05 NOTE — Patient Instructions (Addendum)
For discoid lupus - stop smoking. Consider vitamin D 1000 units daily over the counter. May use clobetasol steroid cream twice daily for 1 week for acute flares.  You will be due for colonoscopy 05/2019 (Buccini).  If interested, check with pharmacy about new 2 shot shingles series (shingrix).  Bring [place] copy of your living will.   Health Maintenance After Age 72 After age 47, you are at a higher risk for certain long-term diseases and infections as well as injuries from falls. Falls are a major cause of broken bones and head injuries in people who are older than age 78. Getting regular preventive care can help to keep you healthy and well. Preventive care includes getting regular testing and making lifestyle changes as recommended by your health care provider. Talk with your health care provider about:  Which screenings and tests you should have. A screening is a test that checks for a disease when you have no symptoms.  A diet and exercise plan that is right for you. What should I know about screenings and tests to prevent falls? Screening and testing are the best ways to find a health problem early. Early diagnosis and treatment give you the best chance of managing medical conditions that are common after age 72. Certain conditions and lifestyle choices may make you more likely to have a fall. Your health care provider may recommend:  Regular vision checks. Poor vision and conditions such as cataracts can make you more likely to have a fall. If you wear glasses, make sure to get your prescription updated if your vision changes.  Medicine review. Work with your health care provider to regularly review all of the medicines you are taking, including over-the-counter medicines. Ask your health care provider about any side effects that may make you more likely to have a fall. Tell your health care provider if any medicines that you take make you feel dizzy or sleepy.  Osteoporosis screening.  Osteoporosis is a condition that causes the bones to get weaker. This can make the bones weak and cause them to break more easily.  Blood pressure screening. Blood pressure changes and medicines to control blood pressure can make you feel dizzy.  Strength and balance checks. Your health care provider may recommend certain tests to check your strength and balance while standing, walking, or changing positions.  Foot health exam. Foot pain and numbness, as well as not wearing proper footwear, can make you more likely to have a fall.  Depression screening. You may be more likely to have a fall if you have a fear of falling, feel emotionally low, or feel unable to do activities that you used to do.  Alcohol use screening. Using too much alcohol can affect your balance and may make you more likely to have a fall. What actions can I take to lower my risk of falls? General instructions  Talk with your health care provider about your risks for falling. Tell your health care provider if: ? You fall. Be sure to tell your health care provider about all falls, even ones that seem minor. ? You feel dizzy, sleepy, or off-balance.  Take over-the-counter and prescription medicines only as told by your health care provider. These include any supplements.  Eat a healthy diet and maintain a healthy weight. A healthy diet includes low-fat dairy products, low-fat (lean) meats, and fiber from whole grains, beans, and lots of fruits and vegetables. Home safety  Remove any tripping hazards, such as rugs, cords, and  clutter.  Install safety equipment such as grab bars in bathrooms and safety rails on stairs.  Keep rooms and walkways well-lit. Activity   Follow a regular exercise program to stay fit. This will help you maintain your balance. Ask your health care provider what types of exercise are appropriate for you.  If you need a cane or walker, use it as recommended by your health care provider.  Wear  supportive shoes that have nonskid soles. Lifestyle  Do not drink alcohol if your health care provider tells you not to drink.  If you drink alcohol, limit how much you have: ? 0-1 drink a day for women. ? 0-2 drinks a day for men.  Be aware of how much alcohol is in your drink. In the [place] one drink equals one typical bottle of beer (12 oz), one-half glass of wine (5 oz), or one shot of hard liquor (1 oz).  Do not use any products that contain nicotine or tobacco, such as cigarettes and e-cigarettes. If you need help quitting, ask your health care provider. Summary  Having a healthy lifestyle and getting preventive care can help to protect your health and wellness after age 53.  Screening and testing are the best way to find a health problem early and help you avoid having a fall. Early diagnosis and treatment give you the best chance for managing medical conditions that are more common for people who are older than age 80.  Falls are a major cause of broken bones and head injuries in people who are older than age 40. Take precautions to prevent a fall at home.  Work with your health care provider to learn what changes you can make to improve your health and wellness and to prevent falls. This information is not intended to replace advice given to you by your health care provider. Make sure you discuss any questions you have with your health care provider. Document Released: 10/10/2017 Document Revised: 10/10/2017 Document Reviewed: 10/10/2017 Elsevier Interactive Patient Education  2019 [employer].

## 2019-02-05 NOTE — Telephone Encounter (Signed)
Best number [phone] (pt number)   Glenard Haring @ [facility] called to see if pt has had a tetanus vaccine and if so what kind.  Pt called insurance his ins company to see if this weill be covered and they need to know what tetanus vaccine would be given.   Please let pt know

## 2019-02-05 NOTE — Assessment & Plan Note (Signed)
Chronic, stable on spiriva and albuterol PRN

## 2019-02-05 NOTE — Assessment & Plan Note (Signed)
Encouraged healthy diet and lifestyle changes to affect sustainable weight loss.  

## 2019-02-12 ENCOUNTER — Telehealth: Payer: Self-pay

## 2019-02-12 MED ORDER — AZELASTINE HCL 0.1 % NA SOLN: 1.0000 | Freq: Two times a day (BID) | NASAL | 3 refills | Status: DC

## 2019-02-12 NOTE — Telephone Encounter (Signed)
azelastin may not be as effective as fluticasone. However ok to try this. Can take both medicines together. Update [place] with effect.

## 2019-02-12 NOTE — Telephone Encounter (Signed)
Pt said he has been on fluticasone for a long time for allergies and pt thinks has lost its effectiveness for pt. Pt said pharmacist advised a good substitute would be Azelastine.pt wants to know if Dr Darnell Level will prescribe Azelastine to [facility]. Pt last annual 02/05/19

## 2019-02-13 NOTE — Telephone Encounter (Signed)
Patient returned Lisa's call and I relayed Dr.G's message to patient.

## 2019-02-13 NOTE — Telephone Encounter (Signed)
Noted  

## 2019-02-13 NOTE — Telephone Encounter (Signed)
Attempted to contact pt.  No answer.  No vm.  Need to relay Dr. G's message.  

## 2019-03-23 NOTE — Progress Notes (Signed)
I reviewed health advisor's note, was available for consultation, and agree with documentation and plan.  

## 2019-04-22 ENCOUNTER — Telehealth: Payer: Self-pay | Admitting: Family Medicine

## 2019-04-22 MED ORDER — TIOTROPIUM BROMIDE MONOHYDRATE 18 MCG IN CAPS: 18.0000 ug | ORAL_CAPSULE | Freq: Every day | RESPIRATORY_TRACT | 3 refills | Status: DC

## 2019-04-22 MED ORDER — AMLODIPINE BESYLATE 10 MG PO TABS: ORAL_TABLET | ORAL | 3 refills | Status: DC

## 2019-04-22 MED ORDER — AZELASTINE HCL 0.1 % NA SOLN: 1.0000 | Freq: Two times a day (BID) | NASAL | 3 refills | Status: DC

## 2019-04-22 MED ORDER — PRAVASTATIN SODIUM 40 MG PO TABS: ORAL_TABLET | ORAL | 3 refills | Status: DC

## 2019-04-22 MED ORDER — FLUTICASONE PROPIONATE 50 MCG/ACT NA SUSP: 2.0000 | Freq: Every day | NASAL | 3 refills | Status: DC

## 2019-04-22 MED ORDER — TAMSULOSIN HCL 0.4 MG PO CAPS: 0.4000 mg | ORAL_CAPSULE | Freq: Every day | ORAL | 3 refills | Status: DC

## 2019-04-22 NOTE — Telephone Encounter (Signed)
E-scribed refills to [facility] as requested.

## 2019-04-22 NOTE — Telephone Encounter (Signed)
Patient wants all of his medications changed from [facility] to [facility].  With [facility] patient doesn't have a co-pay. Patient said the fax number for [facility] is [phone].

## 2019-07-02 ENCOUNTER — Encounter: Payer: Self-pay | Admitting: Optometry

## 2019-07-02 DIAGNOSIS — H353110 Nonexudative age-related macular degeneration, right eye, stage unspecified: Secondary | ICD-10-CM | POA: Diagnosis not present

## 2019-07-02 DIAGNOSIS — H5203 Hypermetropia, bilateral: Secondary | ICD-10-CM | POA: Diagnosis not present

## 2019-07-22 ENCOUNTER — Ambulatory Visit (INDEPENDENT_AMBULATORY_CARE_PROVIDER_SITE_OTHER): Payer: Medicare Other | Admitting: Family Medicine

## 2019-07-22 ENCOUNTER — Encounter: Payer: Self-pay | Admitting: Family Medicine

## 2019-07-22 ENCOUNTER — Other Ambulatory Visit: Payer: Self-pay

## 2019-07-22 VITALS — BP 120/60 | HR 96 | Temp 98.7°F | Ht 69.5 in | Wt 241.1 lb

## 2019-07-22 DIAGNOSIS — K5909 Other constipation: Secondary | ICD-10-CM | POA: Insufficient documentation

## 2019-07-22 DIAGNOSIS — J302 Other seasonal allergic rhinitis: Secondary | ICD-10-CM

## 2019-07-22 DIAGNOSIS — K5900 Constipation, unspecified: Secondary | ICD-10-CM

## 2019-07-22 DIAGNOSIS — J309 Allergic rhinitis, unspecified: Secondary | ICD-10-CM | POA: Insufficient documentation

## 2019-07-22 MED ORDER — POLYETHYLENE GLYCOL 3350 17 GM/SCOOP PO POWD: 17.0000 g | Freq: Every day | ORAL | 1 refills | Status: AC

## 2019-07-22 MED ORDER — LORATADINE 10 MG PO TABS: 10.0000 mg | ORAL_TABLET | Freq: Every day | ORAL | Status: AC

## 2019-07-22 NOTE — Assessment & Plan Note (Signed)
Already on flonase and astelin. Add claritin.

## 2019-07-22 NOTE — Assessment & Plan Note (Signed)
No red flags. Trial miralax + probiotic. Update with effect. Pt agrees with plan.

## 2019-07-22 NOTE — Patient Instructions (Addendum)
For allergies, continue flonase and astelin nasal spray. Add claritin or allegra daily.  For constipation - back off prune juice, instead use miralax 1/2-1 capful in 8 oz liquid daily, hold for diarrhea.  Consider trial of probiotic like activia (yogurt) or philips colon health capsule. Try daily for a few weeks to see if any benefit. Over the counter.  Return at your convenience for skin tag removal.

## 2019-07-22 NOTE — Progress Notes (Signed)
This visit was conducted in person.  BP 120/60 (BP Location: Left Arm, Patient Position: Sitting, Cuff Size: Large)    Pulse 96    Temp 98.7 F (37.1 C) (Temporal)    Ht 5' 9.5" (1.765 m)    Wt 241 lb 1 oz (109.3 kg)    SpO2 97%    BMI 35.09 kg/m    CC: constipation Subjective:    Patient ID: Dominic Turner, male    DOB: 1946-11-04, 73 y.o.   MRN: [id number]  HPI: Dominic Turner is a 74 y.o. male presenting on 07/22/2019 for Constipation (C/o constipation and gas at night.  Started about 2 wks ago. States prune juice helps. H/o IBS. ) and Allergies (C/o stuffy nose while sleeping and occasional A.M. sinus HA. )   Unfortunately has been unable to visit wife since March 2020 (dementia).   2 wk h/o increased gassiness, worsening constipation treating with prune juice with benefit. Continues docusate daily. Malaise after BM, sometimes nausea. No abd pain, blood in stool, vomiting. Drinks 1 cup prune juice every 1-2 days. 2 stools/day when he drinks prune juice. Strains with first bowel movement, then second BM is watery.   H/o IBS.  Has low fiber diet. He does drink plenty of water.   Increasing allergies - night time nasal congestion and sinus headache. This is despite flonase and astelin nasal spray. Not taking oral antihistamine.   COLONOSCOPY  05/2016 TA x3, diverticulosis, single angiodysplastic lesion, rpt 3 yrs (Buccini)  Due for colonoscopy.      Relevant past medical, surgical, family and social history reviewed and updated as indicated. Interim medical history since our last visit reviewed. Allergies and medications reviewed and updated. Outpatient Medications Prior to Visit  Medication Sig Dispense Refill   acetaminophen (TYLENOL) 500 MG tablet Take 500 mg by mouth every 6 (six) hours as needed.     albuterol (PROVENTIL HFA;VENTOLIN HFA) 108 (90 Base) MCG/ACT inhaler Take 2 puffs in AM & PM every day for next 5 days, & also can use every 4-6 hours as needed. After 5  days, use only every 4-6 hrs as needed 1 Inhaler 0   amLODipine (NORVASC) 10 MG tablet TAKE 1 TABLET (10 MG TOTAL) BY MOUTH DAILY. 90 tablet 3   aspirin 81 MG tablet Take 81 mg by mouth daily.       azelastine (ASTELIN) 0.1 % nasal spray Place 1 spray into both nostrils 2 (two) times daily. Use in each nostril as directed 30 mL 3   clobetasol cream (TEMOVATE) 3.38 % Apply 1 application topically 2 (two) times daily. 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     fluticasone (FLONASE) 50 MCG/ACT nasal spray Place 2 sprays into both nostrils daily. 48 g 3   ibuprofen (ADVIL,MOTRIN) 600 MG tablet Take 1 tablet (600 mg total) by mouth 3 (three) times daily as needed. (Patient taking differently: Take 600 mg by mouth daily as needed. ) 20 tablet 0   Multiple Vitamin Essential TABS Take 1 tablet by mouth daily.       pravastatin (PRAVACHOL) 40 MG tablet TAKE 1 TABLET (40 MG TOTAL) BY MOUTH DAILY. 90 tablet 3   tamsulosin (FLOMAX) 0.4 MG CAPS capsule Take 1 capsule (0.4 mg total) by mouth daily. 90 capsule 3   tiotropium (SPIRIVA HANDIHALER) 18 MCG inhalation capsule Place 1 capsule (18 mcg  total) into inhaler and inhale at bedtime. 90 capsule 3   Facility-Administered Medications Prior to Visit  Medication Dose Route Frequency Provider Last Rate Last Dose   albuterol (PROVENTIL) (2.5 MG/3ML) 0.083% nebulizer solution 2.5 mg  2.5 mg Nebulization Once Guse, Jacquelynn Cree, FNP       betamethasone acetate-betamethasone sodium phosphate (CELESTONE) injection 3 mg  3 mg Intramuscular Once Edrick Kins, DPM         Per HPI unless specifically indicated in ROS section below Review of Systems Objective:    BP 120/60 (BP Location: Left Arm, Patient Position: Sitting, Cuff Size: Large)    Pulse 96    Temp 98.7 F (37.1 C) (Temporal)    Ht 5' 9.5" (1.765 m)    Wt 241 lb 1 oz (109.3 kg)    SpO2 97%    BMI 35.09  kg/m   Wt Readings from Last 3 Encounters:  07/22/19 241 lb 1 oz (109.3 kg)  02/05/19 238 lb (108 kg)  02/04/19 236 lb 12.8 oz (107.4 kg)    Physical Exam Vitals signs and nursing note reviewed.  Constitutional:      Appearance: Normal appearance. He is obese. He is not ill-appearing.  HENT:     Head: Normocephalic and atraumatic.     Nose: Nose normal. No mucosal edema, congestion or rhinorrhea.     Right Sinus: No maxillary sinus tenderness or frontal sinus tenderness.     Left Sinus: No maxillary sinus tenderness or frontal sinus tenderness.     Comments: Dry nasal mucosa    Mouth/Throat:     Mouth: Mucous membranes are moist.     Pharynx: No posterior oropharyngeal erythema.  Abdominal:     General: Abdomen is flat. Bowel sounds are normal. There is no distension.     Palpations: Abdomen is soft. There is no mass.     Tenderness: There is no abdominal tenderness. There is no right CVA tenderness, left CVA tenderness, guarding or rebound. Negative signs include Murphy's sign.     Hernia: No hernia is present.  Skin:    General: Skin is warm and dry.     Findings: No rash.  Neurological:     Mental Status: He is alert.  Psychiatric:        Mood and Affect: Mood normal.        Behavior: Behavior normal.       Assessment & Plan:   Problem List Items Addressed This Visit    Constipation - Primary    No red flags. Trial miralax + probiotic. Update with effect. Pt agrees with plan.       Allergic rhinitis    Already on flonase and astelin. Add claritin.           Meds ordered this encounter  Medications   loratadine (CLARITIN) 10 MG tablet    Sig: Take 1 tablet (10 mg total) by mouth daily.   polyethylene glycol powder (GLYCOLAX/MIRALAX) 17 GM/SCOOP powder    Sig: Take 17 g by mouth daily.    Dispense:  3350 g    Refill:  1   No orders of the defined types were placed in this encounter.   Follow up plan: No follow-ups on file.  Ria Bush, MD

## 2019-07-28 DIAGNOSIS — Z86010 Personal history of colonic polyps: Secondary | ICD-10-CM | POA: Diagnosis not present

## 2019-07-28 DIAGNOSIS — K5901 Slow transit constipation: Secondary | ICD-10-CM | POA: Diagnosis not present

## 2019-07-29 ENCOUNTER — Ambulatory Visit (INDEPENDENT_AMBULATORY_CARE_PROVIDER_SITE_OTHER): Payer: Medicare Other | Admitting: Family Medicine

## 2019-07-29 ENCOUNTER — Other Ambulatory Visit: Payer: Self-pay

## 2019-07-29 ENCOUNTER — Encounter: Payer: Self-pay | Admitting: Family Medicine

## 2019-07-29 VITALS — BP 120/64 | HR 89 | Temp 98.2°F | Ht 69.5 in | Wt 242.1 lb

## 2019-07-29 DIAGNOSIS — L918 Other hypertrophic disorders of the skin: Secondary | ICD-10-CM | POA: Diagnosis not present

## 2019-07-29 NOTE — Progress Notes (Addendum)
S: The patient complains of symptomatic skin tag on the R upper chest. These are irritated by clothing, jewelry and rubbing.  O: Patient appears well. Large skin tag are noted on the R upper chest.   A: Skin tag   P: Skin tag are snipped off using Betadine for cleansing and sterile iris scissors. Local anesthesia with epi was used. Ethyl chloride for anesthesia and silver nitrate for cauterization. These pathognomonic lesions are not sent for pathology.

## 2019-07-29 NOTE — Patient Instructions (Signed)
Skin Tag, Adult  A skin tag (acrochordon) is a soft, extra growth of skin. Most skin tags are flesh-colored and rarely bigger than a pencil eraser. They commonly form near areas where there are folds in the skin, such as the armpit or groin. Skin tags are not dangerous, and they do not spread from person to person (are not contagious). You may have one skin tag or several. Skin tags do not require treatment. However, your health care provider may recommend removal of a skin tag if it:  Gets irritated from clothing.  Bleeds.  Is visible and unsightly. Your health care provider can remove skin tags with a simple surgical procedure or a procedure that involves freezing the skin tag. Follow these instructions at home:  Watch for any changes in your skin tag. A normal skin tag does not require any other special care at home.  Take over-the-counter and prescription medicines only as told by your health care provider.  Keep all follow-up visits as told by your health care provider. This is important. Contact a health care provider if:  You have a skin tag that: ? Becomes painful. ? Changes color. ? Bleeds. ? Swells.  You develop more skin tags. This information is not intended to replace advice given to you by your health care provider. Make sure you discuss any questions you have with your health care provider. Document Released: 12/12/2015 Document Revised: 11/09/2017 Document Reviewed: 12/12/2015 Elsevier Patient Education  2020 Elsevier Inc.  

## 2019-09-09 DIAGNOSIS — Z1159 Encounter for screening for other viral diseases: Secondary | ICD-10-CM | POA: Diagnosis not present

## 2019-09-11 HISTORY — PX: COLONOSCOPY: SHX174

## 2019-09-12 DIAGNOSIS — D120 Benign neoplasm of cecum: Secondary | ICD-10-CM | POA: Diagnosis not present

## 2019-09-12 DIAGNOSIS — K5140 Inflammatory polyps of colon without complications: Secondary | ICD-10-CM | POA: Diagnosis not present

## 2019-09-12 DIAGNOSIS — K621 Rectal polyp: Secondary | ICD-10-CM | POA: Diagnosis not present

## 2019-09-12 DIAGNOSIS — Z86010 Personal history of colonic polyps: Secondary | ICD-10-CM | POA: Diagnosis not present

## 2019-09-17 DIAGNOSIS — D120 Benign neoplasm of cecum: Secondary | ICD-10-CM | POA: Diagnosis not present

## 2019-09-17 DIAGNOSIS — K621 Rectal polyp: Secondary | ICD-10-CM | POA: Diagnosis not present

## 2019-09-17 DIAGNOSIS — K5140 Inflammatory polyps of colon without complications: Secondary | ICD-10-CM | POA: Diagnosis not present

## 2019-10-28 ENCOUNTER — Telehealth: Payer: Self-pay | Admitting: Family Medicine

## 2019-10-28 DIAGNOSIS — B350 Tinea barbae and tinea capitis: Secondary | ICD-10-CM

## 2019-10-28 DIAGNOSIS — L219 Seborrheic dermatitis, unspecified: Secondary | ICD-10-CM

## 2019-10-28 DIAGNOSIS — L930 Discoid lupus erythematosus: Secondary | ICD-10-CM

## 2019-10-28 NOTE — Telephone Encounter (Signed)
Pt called wanting to get a referral to a dermatology.  He is not happy with the one he has.  He stated it was for scalp and that you were aware of his problem

## 2019-10-29 NOTE — Telephone Encounter (Signed)
New referral placed Previously saw Lyndle Herrlich.

## 2019-10-29 NOTE — Addendum Note (Signed)
Addended by: Ria Bush on: 10/29/2019 02:36 PM   Modules accepted: Orders

## 2019-10-30 NOTE — Telephone Encounter (Signed)
Appt made and patient notified 

## 2019-11-03 ENCOUNTER — Encounter: Payer: Self-pay | Admitting: Family Medicine

## 2019-11-10 DIAGNOSIS — L930 Discoid lupus erythematosus: Secondary | ICD-10-CM | POA: Diagnosis not present

## 2019-12-30 ENCOUNTER — Telehealth: Payer: Self-pay

## 2019-12-30 NOTE — Telephone Encounter (Signed)
Pt aware.

## 2019-12-30 NOTE — Telephone Encounter (Signed)
[facility] - Client Nonclinical Telephone Record AccessNurse Client [facility] - Client Client Site [place] Physician Ria Bush - MD Contact Type Call Who Is Calling Patient / Member / Family / Caregiver Caller Name [place] Phone Number [phone] Patient Name Dominic Turner Patient DOB Nov 05, 1946 Call Type Message Only Information Provided Reason for Call Request for General Office Information Initial Comment Caller states that he needs to know when his physical is scheduled. Please let him know. Also, when will you be getting the Covid vaccines? Declined triage. Additional Comment Disp. Time Disposition Final User 12/30/2019 7:34:13 AM General Information Provided Yes Windy Canny Call Closed By: Windy Canny Transaction Date/Time: 12/30/2019 7:31:10 AM (ET)

## 2020-02-05 ENCOUNTER — Other Ambulatory Visit: Payer: Self-pay | Admitting: Family Medicine

## 2020-02-08 ENCOUNTER — Other Ambulatory Visit: Payer: Self-pay | Admitting: Family Medicine

## 2020-02-08 DIAGNOSIS — D563 Thalassemia minor: Secondary | ICD-10-CM

## 2020-02-08 DIAGNOSIS — I10 Essential (primary) hypertension: Secondary | ICD-10-CM

## 2020-02-08 DIAGNOSIS — Z125 Encounter for screening for malignant neoplasm of prostate: Secondary | ICD-10-CM

## 2020-02-08 DIAGNOSIS — E7800 Pure hypercholesterolemia, unspecified: Secondary | ICD-10-CM

## 2020-02-11 ENCOUNTER — Other Ambulatory Visit (INDEPENDENT_AMBULATORY_CARE_PROVIDER_SITE_OTHER): Payer: Medicare Other

## 2020-02-11 ENCOUNTER — Other Ambulatory Visit: Payer: Self-pay

## 2020-02-11 ENCOUNTER — Ambulatory Visit (INDEPENDENT_AMBULATORY_CARE_PROVIDER_SITE_OTHER): Payer: Medicare Other

## 2020-02-11 DIAGNOSIS — E7800 Pure hypercholesterolemia, unspecified: Secondary | ICD-10-CM | POA: Diagnosis not present

## 2020-02-11 DIAGNOSIS — Z0000 Encounter for general adult medical examination without abnormal findings: Secondary | ICD-10-CM

## 2020-02-11 DIAGNOSIS — D563 Thalassemia minor: Secondary | ICD-10-CM

## 2020-02-11 DIAGNOSIS — Z125 Encounter for screening for malignant neoplasm of prostate: Secondary | ICD-10-CM | POA: Diagnosis not present

## 2020-02-11 DIAGNOSIS — I10 Essential (primary) hypertension: Secondary | ICD-10-CM | POA: Diagnosis not present

## 2020-02-11 LAB — MICROALBUMIN / CREATININE URINE RATIO
Creatinine,U: 147.4 mg/dL
Microalb Creat Ratio: 0.5 mg/g (ref 0.0–30.0)
Microalb, Ur: <0.7 mg/dL (ref 0.0–1.9)

## 2020-02-11 LAB — CBC WITH DIFFERENTIAL/PLATELET
Basophils Absolute: 0.1 10*3/uL (ref 0.0–0.1)
Basophils Relative: 1.8 % (ref 0.0–3.0)
Eosinophils Absolute: 0.3 10*3/uL (ref 0.0–0.7)
Eosinophils Relative: 6.3 % — ABNORMAL HIGH (ref 0.0–5.0)
HCT: 47.1 % (ref 39.0–52.0)
Hemoglobin: 15.3 g/dL (ref 13.0–17.0)
Lymphocytes Relative: 41 % (ref 12.0–46.0)
Lymphs Abs: 2 10*3/uL (ref 0.7–4.0)
MCHC: 32.5 g/dL (ref 30.0–36.0)
MCV: 72 fl — ABNORMAL LOW (ref 78.0–100.0)
Monocytes Absolute: 0.6 10*3/uL (ref 0.1–1.0)
Monocytes Relative: 12.4 % — ABNORMAL HIGH (ref 3.0–12.0)
Neutro Abs: 1.8 10*3/uL (ref 1.4–7.7)
Neutrophils Relative %: 38.5 % — ABNORMAL LOW (ref 43.0–77.0)
Platelets: 170 10*3/uL (ref 150.0–400.0)
RBC: 6.54 Mil/uL — ABNORMAL HIGH (ref 4.22–5.81)
RDW: 21.3 % — ABNORMAL HIGH (ref 11.5–15.5)
WBC: 4.8 10*3/uL (ref 4.0–10.5)

## 2020-02-11 LAB — COMPREHENSIVE METABOLIC PANEL
ALT: 13 U/L (ref 0–53)
AST: 14 U/L (ref 0–37)
Albumin: 3.8 g/dL (ref 3.5–5.2)
Alkaline Phosphatase: 95 U/L (ref 39–117)
BUN: 9 mg/dL (ref 6–23)
CO2: 31 mEq/L (ref 19–32)
Calcium: 9.9 mg/dL (ref 8.4–10.5)
Chloride: 102 mEq/L (ref 96–112)
Creatinine, Ser: 1.12 mg/dL (ref 0.40–1.50)
GFR: 77.92 mL/min (ref >60.00)
Glucose, Bld: 97 mg/dL (ref 70–99)
Potassium: 4.3 mEq/L (ref 3.5–5.1)
Sodium: 138 mEq/L (ref 135–145)
Total Bilirubin: 0.6 mg/dL (ref 0.2–1.2)
Total Protein: 6 g/dL (ref 6.0–8.3)

## 2020-02-11 LAB — LIPID PANEL
Cholesterol: 182 mg/dL (ref 0–200)
HDL: 39.1 mg/dL (ref >39.00)
LDL Cholesterol: 122 mg/dL — ABNORMAL HIGH (ref 0–99)
NonHDL: 142.65
Total CHOL/HDL Ratio: 5
Triglycerides: 103 mg/dL (ref 0.0–149.0)
VLDL: 20.6 mg/dL (ref 0.0–40.0)

## 2020-02-11 LAB — PSA, MEDICARE: PSA: 0.65 ng/ml (ref 0.10–4.00)

## 2020-02-11 NOTE — Progress Notes (Signed)
Subjective:   Dominic Turner is a 74 y.o. male who presents for Medicare Annual/Subsequent preventive examination.  Review of Systems: N/A   This visit is being conducted through telemedicine via telephone at the nurse health advisor's home address due to the COVID-19 pandemic. This patient has given me verbal consent via doximity to conduct this visit, patient states they are participating from their home address. Patient and myself are on the telephone call. There is no referral for this visit. Some vital signs may be absent or patient reported.    Patient identification: identified by name, DOB, and current address   Cardiac Risk Factors include: advanced age (>52men, >49 women);male gender;hypertension;Other (see comment), Risk factor comments: hypercholesterolemia     Objective:    Vitals: There were no vitals taken for this visit.  There is no height or weight on file to calculate BMI.  Advanced Directives 02/11/2020 02/04/2019 01/24/2018 12/30/2015  Does Patient Have a Medical Advance Directive? Yes Yes Yes Yes  Type of Paramedic of [place];Living will [place];Living will [place];Living will [place];Living will  Does patient want to make changes to medical advance directive? - - - No - Patient declined  Copy of [place] in Chart? No - copy requested No - copy requested No - copy requested -    Tobacco Social History   Tobacco Use  Smoking Status Current Every Day Smoker  . Packs/day: 0.50  . Years: 48.00  . Pack years: 24.00  . Types: Cigarettes  . Start date: 12/12/1983  Smokeless Tobacco Never Used     Ready to quit: Not Answered Counseling given: Not Answered   Clinical Intake:  Pre-visit preparation completed: Yes  Pain : No/denies pain     Nutritional Risks: None Diabetes: No  How often do you need to have someone help you when you read  instructions, pamphlets, or other written materials from your doctor or pharmacy?: 1 - Never What is the last grade level you completed in school?: 12th  Interpreter Needed?: No  Information entered by :: CJohnson, LPN  Past Medical History:  Diagnosis Date  . Acute cholecystitis 12/2015  . ANXIETY DEPRESSION 10/23/2008  . 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 Bilateral high frequency sensorineural hearing loss    rec hearing aides by ENT  . 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 Post bronchodilator test not improved.  . Diverticulosis 02/2013   by colonoscopy  . Essential hypertension, benign   . History of adenomatous polyp of colon 02/2013   rec rpt 3 yrs  . Overweight(278.02)   . 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 Pure hypercholesterolemia   . Tobacco use disorder   . Unspecified gastritis and gastroduodenitis without mention of hemorrhage   . Unspecified sleep apnea    Past Surgical History:  Procedure Laterality Date  . CATARACT EXTRACTION, BILATERAL Bilateral   . CHOLECYSTECTOMY N/A 12/30/2015   Procedure: LAPAROSCOPIC CHOLECYSTECTOMY;  Surgeon: Florene Glen, MD;  Location: [facility] ORS;  Service: General;  Laterality: N/A;  . COLONOSCOPY  03/03/2013   tubular adenoma x3, diverticulosis, rpt 3 yrs (Dr. Cristina Gong)  . COLONOSCOPY  05/2016   TA x3, diverticulosis, single angiodysplastic lesion, rpt 3 yrs (Buccini)  . COLONOSCOPY  09/2019   TA, HP, rpt 5 yrs (Buccini)   Family History  Problem Relation Age of Onset  . Cirrhosis Father        died in 51's  . Hypertension Mother   . Heart attack Sister   . Stroke Sister   . Heart attack Maternal Grandmother   . Stroke Maternal Grandmother   . Thyroid disease Sister   . Diabetes Sister   . Cancer Neg Hx    Social History   Socioeconomic History  . Marital status: Married    Spouse name: Not on file  . Number  of children: 2  . Years of education: Not on file  . Highest education level: Not on file  Occupational History  . Occupation: truck Geophysicist/field seismologist  Tobacco Use  . Smoking status: Current Every Day Smoker    Packs/day: 0.50    Years: 48.00    Pack years: 24.00    Types: Cigarettes    Start date: 12/12/1983  . Smokeless tobacco: Never Used  Substance and Sexual Activity  . Alcohol use: No    Alcohol/week: 0.0 standard drinks  . Drug use: No  . Sexual activity: Never  Other Topics Concern  . Not on file  Social History Narrative   Lives alone. Wife lives at [facility] in [place] in [place] (memory unit). Sees wife daily.    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 Difficulty of Paying Living Expenses: Not hard at all  Food Insecurity: No Food Insecurity  . Worried About Charity fundraiser in the Last Year: Never true  . Ran Out of Food in the Last Year: Never true  Transportation Needs: No Transportation Needs  . Lack of Transportation (Medical): No  . Lack of Transportation (Non-Medical): No  Physical Activity: Inactive  . Days of Exercise per Week: 0 days  . Minutes of Exercise per Session: 0 min  Stress: No Stress Concern Present  . Feeling of Stress : Not at all  Social Connections:   . Frequency of Communication with Friends and Family: Not on file  . Frequency of Social Gatherings with Friends and Family: Not on file  . Attends Religious Services: Not on file  . Active Member of Clubs or Organizations: Not on file  . Attends Archivist Meetings: Not on file  . Marital Status: Not on file    Outpatient Encounter Medications as of 02/11/2020  Medication Sig  . acetaminophen (TYLENOL) 500 MG tablet Take 500 mg by mouth every 6 (six) hours as needed.  Marland Kitchen albuterol (PROVENTIL HFA;VENTOLIN HFA)  108 (90 Base) MCG/ACT inhaler Take 2 puffs in AM & PM every day for next 5 days, & also can use every 4-6 hours as needed. After 5 days, use only every 4-6 hrs as needed  . amLODipine (NORVASC) 10 MG tablet TAKE 1 TABLET BY MOUTH  DAILY  . aspirin 81 MG tablet Take 81 mg by mouth daily.    Marland Kitchen azelastine (ASTELIN) 0.1 % nasal spray Place 1 spray into both nostrils 2 (two) times daily. Use in each nostril as directed  . Docusate Calcium (STOOL SOFTENER PO) Take 1 tablet by mouth daily.  . fluticasone (FLONASE) 50 MCG/ACT nasal spray USE 2 SPRAYS IN BOTH  NOSTRILS DAILY  . ibuprofen (ADVIL,MOTRIN) 600 MG tablet Take 1 tablet (600 mg total) by mouth 3 (three)  times daily as needed. (Patient taking differently: Take 600 mg by mouth daily as needed. )  . loratadine (CLARITIN) 10 MG tablet Take 1 tablet (10 mg total) by mouth daily.  . Multiple Vitamin Essential TABS Take 1 tablet by mouth daily.    . polyethylene glycol powder (GLYCOLAX/MIRALAX) 17 GM/SCOOP powder Take 17 g by mouth daily.  . pravastatin (PRAVACHOL) 40 MG tablet TAKE 1 TABLET BY MOUTH  DAILY  . tamsulosin (FLOMAX) 0.4 MG CAPS capsule TAKE 1 CAPSULE BY MOUTH  DAILY  . tiotropium (SPIRIVA HANDIHALER) 18 MCG inhalation capsule Place 1 capsule (18 mcg total) into inhaler and inhale at bedtime.   Facility-Administered Encounter Medications as of 02/11/2020  Medication  . albuterol (PROVENTIL) (2.5 MG/3ML) 0.083% nebulizer solution 2.5 mg  . betamethasone acetate-betamethasone sodium phosphate (CELESTONE) injection 3 mg    Activities of Daily Living In your present state of health, do you have any difficulty performing the following activities: 02/11/2020  Hearing? Y  Comment plans on getting some hearing aids  Vision? N  Difficulty concentrating or making decisions? N  Walking or climbing stairs? N  Dressing or bathing? N  Doing errands, shopping? N  Preparing Food and eating ? N  Using the Toilet? N  In the past six months, have you  accidently leaked urine? N  Do you have problems with loss of bowel control? N  Managing your Medications? N  Managing your Finances? N  Housekeeping or managing your Housekeeping? N  Some recent data might be hidden    Patient Care Team: Ria Bush, MD as PCP - General   Assessment:   This is a routine wellness examination for Ewen.  Exercise Activities and Dietary recommendations Current Exercise Habits: The patient does not participate in regular exercise at present, Exercise limited by: None identified  Goals    . Follow up with Primary Care Provider     Starting 02/04/19, I will continue to take medications as prescribed and to keep appointments with PCP as scheduled.     . Patient Stated     02/11/2020, I will maintain and continue medications as prescribed.        Fall Risk Fall Risk  02/11/2020 02/04/2019 01/24/2018 01/19/2017 01/18/2016  Falls in the past year? 0 0 No No No  Number falls in past yr: 0 - - - -  Injury with Fall? 0 - - - -  Risk for fall due to : Medication side effect - - - -  Follow up Falls evaluation completed;Falls prevention discussed - - - -   Is the patient's home free of loose throw rugs in walkways, pet beds, electrical cords, etc?   yes      Grab bars in the bathroom? yes      Handrails on the stairs?   yes      Adequate lighting?   yes  Timed Get Up and Go Performed: N/A  Depression Screen PHQ 2/9 Scores 02/11/2020 02/04/2019 01/24/2018 01/19/2017  PHQ - 2 Score 0 0 0 0  PHQ- 9 Score 0 0 0 -    Cognitive Function MMSE - Mini Mental State Exam 02/11/2020 02/04/2019 01/24/2018  Orientation to time 5 5 5   Orientation to Place 5 5 5   Registration 3 3 3   Attention/ Calculation 5 0 0  Recall 3 3 2   Recall-comments - - unable to recall 1 of 3 words  Language- name 2 objects - 0 0  Language- repeat 1 1 1   Language-  follow 3 step command - 3 1  Language- follow 3 step command-comments - - unable to follow 2 steps of 3 step command  Language-  read & follow direction - 0 0  Write a sentence - 0 0  Copy design - 0 0  Total score - 20 17  Mini Cog  Mini-Cog screen was completed. Maximum score is 22. A value of 0 denotes this part of the MMSE was not completed or the patient failed this part of the Mini-Cog screening.       Immunization History  Administered Date(s) Administered  . Fluad Quad(high Dose 65+) 08/27/2019  . Influenza Split 11/15/2012  . Influenza Whole 12/06/1999, 10/11/2013  . Influenza, High Dose Seasonal PF 09/12/2015, 08/11/2018  . Influenza,inj,Quad PF,6+ Mos 10/02/2014, 09/14/2017  . Influenza-Unspecified 09/18/2016  . PPD Test 02/07/2013  . Pneumococcal Conjugate-13 01/15/2015  . Pneumococcal Polysaccharide-23 12/05/2002, 06/26/2008, 01/09/2014  . Td 03/11/1998, 06/26/2008  . Zoster 10/11/2013    Qualifies for Shingles Vaccine: Yes  Screening Tests Health Maintenance  Topic Date Due  . DTAP VACCINES (1) 01/09/1947  . DTaP/Tdap/Td (3 - Tdap) 12/10/2020 (Originally 06/26/2018)  . TETANUS/TDAP  12/10/2020 (Originally 06/26/2018)  . COLONOSCOPY  09/11/2022  . INFLUENZA VACCINE  Completed  . Hepatitis C Screening  Completed  . PNA vac Low Risk Adult  Completed   Cancer Screenings: Lung: Low Dose CT Chest recommended if Age 45-80 years, 30 pack-year currently smoking OR have quit w/in 15 years. Patient does not qualify. Colorectal: completed 09/12/2019  Additional Screenings:  Hepatitis C Screening: 01/14/2016      Plan:    Patient will maintain and continue medications as prescribed.   I have personally reviewed and noted the following in the patient's chart:   . Medical and social history . Use of alcohol, tobacco or illicit drugs  . Current medications and supplements . Functional ability and status . Nutritional status . Physical activity . Advanced directives . List of other physicians . Hospitalizations, surgeries, and ER visits in previous 12 months . Vitals . Screenings to  include cognitive, depression, and falls . Referrals and appointments  In addition, I have reviewed and discussed with patient certain preventive protocols, quality metrics, and best practice recommendations. A written personalized care plan for preventive services as well as general preventive health recommendations were provided to patient.     Andrez Grime, LPN  [phone]

## 2020-02-11 NOTE — Progress Notes (Signed)
PCP notes:  Health Maintenance: Tdap- insurance/financial   Abnormal Screenings: none   Patient concerns: none   Nurse concerns: none   Next PCP appt.: 02/13/2020 @ 10:30 am

## 2020-02-11 NOTE — Patient Instructions (Signed)
Dominic Turner , Thank you for taking time to come for your Medicare Wellness Visit. I appreciate your ongoing commitment to your health goals. Please review the following plan we discussed and let me know if I can assist you in the future.   Screening recommendations/referrals: Colonoscopy: Up to date, completed 09/12/2019 Recommended yearly ophthalmology/optometry visit for glaucoma screening and checkup Recommended yearly dental visit for hygiene and checkup  Vaccinations: Influenza vaccine: Up to date, completed 08/27/2019 Pneumococcal vaccine: Completed series Tdap vaccine: decline Shingles vaccine: discussed    Advanced directives: Please bring a copy of your POA (Power of Attorney) and/or Living Will to your next appointment.   Conditions/risks identified: hypertension, hypercholesterolemia  Next appointment: 02/13/2020 @ 10:30 am   Preventive Care 65 Years and Older, Male Preventive care refers to lifestyle choices and visits with your health care provider that can promote health and wellness. What does preventive care include?  A yearly physical exam. This is also called an annual well check.  Dental exams once or twice a year.  Routine eye exams. Ask your health care provider how often you should have your eyes checked.  Personal lifestyle choices, including:  Daily care of your teeth and gums.  Regular physical activity.  Eating a healthy diet.  Avoiding tobacco and drug use.  Limiting alcohol use.  Practicing safe sex.  Taking low doses of aspirin every day.  Taking vitamin and mineral supplements as recommended by your health care provider. What happens during an annual well check? The services and screenings done by your health care provider during your annual well check will depend on your age, overall health, lifestyle risk factors, and family history of disease. Counseling  Your health care provider may ask you questions about your:  Alcohol  use.  Tobacco use.  Drug use.  Emotional well-being.  Home and relationship well-being.  Sexual activity.  Eating habits.  History of falls.  Memory and ability to understand (cognition).  Work and work Statistician. Screening  You may have the following tests or measurements:  Height, weight, and BMI.  Blood pressure.  Lipid and cholesterol levels. These may be checked every 5 years, or more frequently if you are over 21 years old.  Skin check.  Lung cancer screening. You may have this screening every year starting at age 90 if you have a 30-pack-year history of smoking and currently smoke or have quit within the past 15 years.  Fecal occult blood test (FOBT) of the stool. You may have this test every year starting at age 6.  Flexible sigmoidoscopy or colonoscopy. You may have a sigmoidoscopy every 5 years or a colonoscopy every 10 years starting at age 2.  Prostate cancer screening. Recommendations will vary depending on your family history and other risks.  Hepatitis C blood test.  Hepatitis B blood test.  Sexually transmitted disease (STD) testing.  Diabetes screening. This is done by checking your blood sugar (glucose) after you have not eaten for a while (fasting). You may have this done every 1-3 years.  Abdominal aortic aneurysm (AAA) screening. You may need this if you are a current or former smoker.  Osteoporosis. You may be screened starting at age 45 if you are at high risk. Talk with your health care provider about your test results, treatment options, and if necessary, the need for more tests. Vaccines  Your health care provider may recommend certain vaccines, such as:  Influenza vaccine. This is recommended every year.  Tetanus, diphtheria, and acellular  pertussis (Tdap, Td) vaccine. You may need a Td booster every 10 years.  Zoster vaccine. You may need this after age 31.  Pneumococcal 13-valent conjugate (PCV13) vaccine. One dose is  recommended after age 86.  Pneumococcal polysaccharide (PPSV23) vaccine. One dose is recommended after age 41. Talk to your health care provider about which screenings and vaccines you need and how often you need them. This information is not intended to replace advice given to you by your health care provider. Make sure you discuss any questions you have with your health care provider. Document Released: 12/24/2015 Document Revised: 08/16/2016 Document Reviewed: 09/28/2015 Elsevier Interactive Patient Education  2017 [place] Prevention in the Home Falls can cause injuries. They can happen to people of all ages. There are many things you can do to make your home safe and to help prevent falls. What can I do on the outside of my home?  Regularly fix the edges of walkways and driveways and fix any cracks.  Remove anything that might make you trip as you walk through a door, such as a raised step or threshold.  Trim any bushes or trees on the path to your home.  Use bright outdoor lighting.  Clear any walking paths of anything that might make someone trip, such as rocks or tools.  Regularly check to see if handrails are loose or broken. Make sure that both sides of any steps have handrails.  Any raised decks and porches should have guardrails on the edges.  Have any leaves, snow, or ice cleared regularly.  Use sand or salt on walking paths during winter.  Clean up any spills in your garage right away. This includes oil or grease spills. What can I do in the bathroom?  Use night lights.  Install grab bars by the toilet and in the tub and shower. Do not use towel bars as grab bars.  Use non-skid mats or decals in the tub or shower.  If you need to sit down in the shower, use a plastic, non-slip stool.  Keep the floor dry. Clean up any water that spills on the floor as soon as it happens.  Remove soap buildup in the tub or shower regularly.  Attach bath mats  securely with double-sided non-slip rug tape.  Do not have throw rugs and other things on the floor that can make you trip. What can I do in the bedroom?  Use night lights.  Make sure that you have a light by your bed that is easy to reach.  Do not use any sheets or blankets that are too big for your bed. They should not hang down onto the floor.  Have a firm chair that has side arms. You can use this for support while you get dressed.  Do not have throw rugs and other things on the floor that can make you trip. What can I do in the kitchen?  Clean up any spills right away.  Avoid walking on wet floors.  Keep items that you use a lot in easy-to-reach places.  If you need to reach something above you, use a strong step stool that has a grab bar.  Keep electrical cords out of the way.  Do not use floor polish or wax that makes floors slippery. If you must use wax, use non-skid floor wax.  Do not have throw rugs and other things on the floor that can make you trip. What can I do with my stairs?  Do not leave any items on the stairs.  Make sure that there are handrails on both sides of the stairs and use them. Fix handrails that are broken or loose. Make sure that handrails are as long as the stairways.  Check any carpeting to make sure that it is firmly attached to the stairs. Fix any carpet that is loose or worn.  Avoid having throw rugs at the top or bottom of the stairs. If you do have throw rugs, attach them to the floor with carpet tape.  Make sure that you have a light switch at the top of the stairs and the bottom of the stairs. If you do not have them, ask someone to add them for you. What else can I do to help prevent falls?  Wear shoes that:  Do not have high heels.  Have rubber bottoms.  Are comfortable and fit you well.  Are closed at the toe. Do not wear sandals.  If you use a stepladder:  Make sure that it is fully opened. Do not climb a closed  stepladder.  Make sure that both sides of the stepladder are locked into place.  Ask someone to hold it for you, if possible.  Clearly mark and make sure that you can see:  Any grab bars or handrails.  First and last steps.  Where the edge of each step is.  Use tools that help you move around (mobility aids) if they are needed. These include:  Canes.  Walkers.  Scooters.  Crutches.  Turn on the lights when you go into a dark area. Replace any light bulbs as soon as they burn out.  Set up your furniture so you have a clear path. Avoid moving your furniture around.  If any of your floors are uneven, fix them.  If there are any pets around you, be aware of where they are.  Review your medicines with your doctor. Some medicines can make you feel dizzy. This can increase your chance of falling. Ask your doctor what other things that you can do to help prevent falls. This information is not intended to replace advice given to you by your health care provider. Make sure you discuss any questions you have with your health care provider. Document Released: 09/23/2009 Document Revised: 05/04/2016 Document Reviewed: 01/01/2015 Elsevier Interactive Patient Education  2017 [employer].

## 2020-02-13 ENCOUNTER — Encounter: Payer: Self-pay | Admitting: Family Medicine

## 2020-02-13 ENCOUNTER — Ambulatory Visit: Payer: Medicare Other

## 2020-02-13 ENCOUNTER — Other Ambulatory Visit: Payer: Self-pay

## 2020-02-13 ENCOUNTER — Ambulatory Visit (INDEPENDENT_AMBULATORY_CARE_PROVIDER_SITE_OTHER): Payer: Medicare Other | Admitting: Family Medicine

## 2020-02-13 ENCOUNTER — Telehealth: Payer: Self-pay | Admitting: Family Medicine

## 2020-02-13 VITALS — BP 126/64 | HR 89 | Temp 97.7°F | Ht 68.5 in | Wt 251.3 lb

## 2020-02-13 DIAGNOSIS — L930 Discoid lupus erythematosus: Secondary | ICD-10-CM

## 2020-02-13 DIAGNOSIS — J449 Chronic obstructive pulmonary disease, unspecified: Secondary | ICD-10-CM

## 2020-02-13 DIAGNOSIS — Z7189 Other specified counseling: Secondary | ICD-10-CM | POA: Diagnosis not present

## 2020-02-13 DIAGNOSIS — R3915 Urgency of urination: Secondary | ICD-10-CM

## 2020-02-13 DIAGNOSIS — Z0000 Encounter for general adult medical examination without abnormal findings: Secondary | ICD-10-CM

## 2020-02-13 DIAGNOSIS — E7800 Pure hypercholesterolemia, unspecified: Secondary | ICD-10-CM

## 2020-02-13 DIAGNOSIS — F17200 Nicotine dependence, unspecified, uncomplicated: Secondary | ICD-10-CM

## 2020-02-13 DIAGNOSIS — D563 Thalassemia minor: Secondary | ICD-10-CM

## 2020-02-13 DIAGNOSIS — I10 Essential (primary) hypertension: Secondary | ICD-10-CM

## 2020-02-13 DIAGNOSIS — R351 Nocturia: Secondary | ICD-10-CM

## 2020-02-13 LAB — POC URINALSYSI DIPSTICK (AUTOMATED)
Bilirubin, UA: NEGATIVE
Blood, UA: NEGATIVE
Glucose, UA: NEGATIVE
Ketones, UA: NEGATIVE
Leukocytes, UA: NEGATIVE
Nitrite, UA: NEGATIVE
Protein, UA: NEGATIVE
Spec Grav, UA: 1.025 (ref 1.010–1.025)
Urobilinogen, UA: 0.2 E.U./dL
pH, UA: 6 (ref 5.0–8.0)

## 2020-02-13 MED ORDER — PRAVASTATIN SODIUM 40 MG PO TABS: ORAL_TABLET | ORAL | 3 refills | Status: DC

## 2020-02-13 MED ORDER — AMLODIPINE BESYLATE 10 MG PO TABS: ORAL_TABLET | ORAL | 3 refills | Status: DC

## 2020-02-13 MED ORDER — SPIRIVA HANDIHALER 18 MCG IN CAPS: 18.0000 ug | ORAL_CAPSULE | Freq: Every day | RESPIRATORY_TRACT | 3 refills | Status: DC

## 2020-02-13 MED ORDER — FLUTICASONE PROPIONATE 50 MCG/ACT NA SUSP: NASAL | 3 refills | Status: DC

## 2020-02-13 MED ORDER — TAMSULOSIN HCL 0.4 MG PO CAPS: 0.4000 mg | ORAL_CAPSULE | Freq: Every day | ORAL | 3 refills | Status: DC

## 2020-02-13 NOTE — Progress Notes (Signed)
This visit was conducted in person.  BP 126/64 (BP Location: Left Arm, Patient Position: Sitting, Cuff Size: Large)   Pulse 89   Temp 97.7 F (36.5 C) (Temporal)   Ht 5' 8.5" (1.74 m)   Wt 251 lb 5 oz (114 kg)   SpO2 98%   BMI 37.66 kg/m    CC: CPE Subjective:    Patient ID: Dominic Turner, male    DOB: July 08, 1946, 74 y.o.   MRN: [id number]  HPI: Dominic Turner is a 74 y.o. male presenting on 02/13/2020 for Annual Exam (Prt 2. )   Saw health advisor this week for medicare wellness visit. Note reviewed.   No exam data present    Clinical Support from 02/11/2020 in [place] at [facility] Total Score  0      Fall Risk  02/11/2020 02/04/2019 01/24/2018 01/19/2017 01/18/2016  Falls in the past year? 0 0 No No No  Number falls in past yr: 0 - - - -  Injury with Fall? 0 - - - -  Risk for fall due to : Medication side effect - - - -  Follow up Falls evaluation completed;Falls prevention discussed - - - -      Wife with dementia lives in [place]. He sees her 30 min every other week.  Recent dx discopid lupus L cheek - [facility] Bumps on back of head.   COPD - compliant with spiriva.   Smoking - 1/2 ppd. Has tried nicorette gum. May consider nicoderm patch. Tried chantix - caused rash.  Preventative: COLONOSCOPY 09/2019 - TA, HP, rpt 5 yrs (Buccini) - notes increasing gas since then.  Prostate - DRE normal2018, PSA reassuring. Declines further DRE  Lung cancer screening -referred 2018, not eligible, endorsed 24 PY hx. Flu shot yearly Pneumovax - 20009, 2015. prevnar 2016 Td 2009 zostavax- 10/2013 shingrix - discussed Moderna covid vaccine - to complete tomorrow Advanced directives: has living will at home for himself and wife. HCPOA - son. Doesn't want prolonged life support. Would want sons to make decision for temporary life support.Asked to bring copy.  Seat belt use discussed Sunscreen use discussed.No changing  moles in skin. Smoker - 1/2 ppd  Alcohol - none  Dentist q6 mo. Recent dental work.  Eye exam yearly  Bowel - no constipation - some IBS managed with miralax.  Bladder - no incontinence. Notes increased urgency.   Lives alone. Wife lives at [facility] in [place] in [place] (memory unit). Sees wife daily.  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 Interim medical history since our last visit reviewed. Allergies and medications reviewed and updated. Outpatient Medications Prior to Visit  Medication Sig Dispense Refill  . acetaminophen (TYLENOL) 500 MG tablet Take 500 mg by mouth every 6 (six) hours as needed.    Marland Kitchen albuterol (PROVENTIL HFA;VENTOLIN HFA) 108 (90 Base) MCG/ACT inhaler Take 2 puffs in AM & PM every day for next 5 days, & also can use every 4-6 hours as needed. After 5 days, use only every 4-6 hrs as needed 1 Inhaler 0  . aspirin 81 MG tablet Take 81 mg by mouth daily.      Marland Kitchen azelastine (ASTELIN) 0.1 % nasal spray Place 1 spray into both nostrils 2 (two) times daily. Use in each nostril  as directed 30 mL 3  . clobetasol (TEMOVATE) 0.05 % external solution Apply topically.    Mariane Baumgarten Calcium (STOOL SOFTENER PO) Take 1 tablet by mouth daily.    . fluticasone (CUTIVATE) 0.05 % cream Apply topically. Apply to affected areas on face up to twice a day as needed    . ibuprofen (ADVIL,MOTRIN) 600 MG tablet Take 1 tablet (600 mg total) by mouth 3 (three) times daily as needed. (Patient taking differently: Take 600 mg by mouth daily as needed. ) 20 tablet 0  . loratadine (CLARITIN) 10 MG tablet Take 1 tablet (10 mg total) by mouth daily.    . Multiple Vitamin Essential TABS Take 1 tablet by mouth daily.      . polyethylene glycol powder (GLYCOLAX/MIRALAX) 17 GM/SCOOP powder Take 17 g by mouth  daily. 3350 g 1  . amLODipine (NORVASC) 10 MG tablet TAKE 1 TABLET BY MOUTH  DAILY 90 tablet 0  . fluticasone (FLONASE) 50 MCG/ACT nasal spray USE 2 SPRAYS IN BOTH  NOSTRILS DAILY 48 g 0  . pravastatin (PRAVACHOL) 40 MG tablet TAKE 1 TABLET BY MOUTH  DAILY 90 tablet 0  . tamsulosin (FLOMAX) 0.4 MG CAPS capsule TAKE 1 CAPSULE BY MOUTH  DAILY 90 capsule 0  . tiotropium (SPIRIVA HANDIHALER) 18 MCG inhalation capsule Place 1 capsule (18 mcg total) into inhaler and inhale at bedtime. 90 capsule 3   Facility-Administered Medications Prior to Visit  Medication Dose Route Frequency Provider Last Rate Last Admin  . albuterol (PROVENTIL) (2.5 MG/3ML) 0.083% nebulizer solution 2.5 mg  2.5 mg Nebulization Once Guse, Jacquelynn Cree, FNP      . betamethasone acetate-betamethasone sodium phosphate (CELESTONE) injection 3 mg  3 mg Intramuscular Once Edrick Kins, DPM         Per HPI unless specifically indicated in ROS section below Review of Systems  Constitutional: Negative for activity change, appetite change, chills, fatigue, fever and unexpected weight change.  HENT: Negative for hearing loss.   Eyes: Negative for visual disturbance.  Respiratory: Positive for cough. Negative for chest tightness, shortness of breath and wheezing.   Cardiovascular: Negative for chest pain, palpitations and leg swelling.  Gastrointestinal: Positive for abdominal pain (with gas). Negative for abdominal distention, blood in stool, constipation, diarrhea, nausea and vomiting.  Endocrine: Positive for cold intolerance (stays cold).  Genitourinary: Negative for difficulty urinating and hematuria.  Musculoskeletal: Negative for arthralgias, myalgias and neck pain.  Skin: Negative for rash.  Neurological: Negative for dizziness, seizures, syncope and headaches.  Hematological: Negative for adenopathy. Does not bruise/bleed easily.  Psychiatric/Behavioral: Negative for dysphoric mood. The patient is not nervous/anxious.     Objective:    BP 126/64 (BP Location: Left Arm, Patient Position: Sitting, Cuff Size: Large)   Pulse 89   Temp 97.7 F (36.5 C) (Temporal)   Ht 5' 8.5" (1.74 m)   Wt 251 lb 5 oz (114 kg)   SpO2 98%   BMI 37.66 kg/m   Wt Readings from Last 3 Encounters:  02/13/20 251 lb 5 oz (114 kg)  07/29/19 242 lb 1 oz (109.8 kg)  07/22/19 241 lb 1 oz (109.3 kg)    Physical Exam Vitals and nursing note reviewed.  Constitutional:      General: He is not in acute distress.    Appearance: Normal appearance. He is well-developed. He is not ill-appearing.  HENT:     Head: Normocephalic and atraumatic.     Right Ear: Hearing, tympanic membrane, ear canal and  external ear normal.     Left Ear: Hearing, tympanic membrane, ear canal and external ear normal.     Nose: Nose normal.     Mouth/Throat:     Pharynx: Uvula midline. No oropharyngeal exudate or posterior oropharyngeal erythema.  Eyes:     General: No scleral icterus.    Conjunctiva/sclera: Conjunctivae normal.     Pupils: Pupils are equal, round, and reactive to light.  Cardiovascular:     Rate and Rhythm: Normal rate and regular rhythm.     Pulses:          Radial pulses are 2+ on the right side and 2+ on the left side.     Heart sounds: Normal heart sounds. No murmur.  Pulmonary:     Effort: Pulmonary effort is normal. No respiratory distress.     Breath sounds: Normal breath sounds. No wheezing or rales.  Abdominal:     General: Bowel sounds are normal. There is no distension.     Palpations: Abdomen is soft. There is no mass.     Tenderness: There is no abdominal tenderness. There is no guarding or rebound.  Musculoskeletal:        General: Normal range of motion.     Cervical back: Normal range of motion and neck supple.  Lymphadenopathy:     Cervical: No cervical adenopathy.  Skin:    General: Skin is warm and dry.     Findings: No rash.  Neurological:     Mental Status: He is alert and oriented to person, place, and  time.     Comments: CN grossly intact, station and gait intact  Psychiatric:        Behavior: Behavior normal.        Thought Content: Thought content normal.        Judgment: Judgment normal.       Results for orders placed or performed in visit on 02/13/20  POCT Urinalysis Dipstick (Automated)  Result Value Ref Range   Color, UA yellow    Clarity, UA clear    Glucose, UA Negative Negative   Bilirubin, UA negative    Ketones, UA negative    Spec Grav, UA 1.025 1.010 - 1.025   Blood, UA negative    pH, UA 6.0 5.0 - 8.0   Protein, UA Negative Negative   Urobilinogen, UA 0.2 0.2 or 1.0 E.U./dL   Nitrite, UA negative    Leukocytes, UA Negative Negative   Assessment & Plan:  This visit occurred during the SARS-CoV-2 public health emergency.  Safety protocols were in place, including screening questions prior to the visit, additional usage of staff PPE, and extensive cleaning of exam room while observing appropriate contact time as indicated for disinfecting solutions.   Problem List Items Addressed This Visit    TOBACCO ABUSE    Continue to encourage smoking cessation. Precontemplative.       Severe obesity (BMI 35.0-39.9) with comorbidity ([place])    Weight gain noted. Encourage healthy diet choices.       Nocturia    Presumed BPH related. Continue flomax.  Noticing increasing urinary urgency - update UA.       HYPERTENSION, BENIGN ESSENTIAL    Chronic, stable. Continue current regimen.       Relevant Medications   amLODipine (NORVASC) 10 MG tablet   pravastatin (PRAVACHOL) 40 MG tablet   HYPERCHOLESTEROLEMIA, PURE    Chronic, stable. Continue current regimen.  The 10-year ASCVD risk score Mikey Bussing DC Brooke Bonito., et  al., 2013) is: 32.8%   Values used to calculate the score:     Age: 73 years     Sex: Male     Is Non-Hispanic African American: Yes     Diabetic: No     Tobacco smoker: Yes     Systolic Blood Pressure: 123XX123 mmHg     Is BP treated: Yes     HDL Cholesterol: 39.1  mg/dL     Total Cholesterol: 182 mg/dL       Relevant Medications   amLODipine (NORVASC) 10 MG tablet   pravastatin (PRAVACHOL) 40 MG tablet   Health maintenance examination - Primary    Preventative protocols reviewed and updated unless pt declined. Discussed healthy diet and lifestyle.       Discoid lupus erythematosus    Appreciate derm care.       COPD, severe ([place])    Continued smoker. Continue spiriva.       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       Advanced care planning/counseling discussion    Advanced directives: has living will at home for himself and wife. HCPOA - son. Doesn't want prolonged life support. Would want sons to make decision for temporary life support.Asked to bring copy.        Other Visit Diagnoses    Urinary urgency       Relevant Orders   POCT Urinalysis Dipstick (Automated) (Completed)       Meds ordered this encounter  Medications  . amLODipine (NORVASC) 10 MG tablet    Sig: TAKE 1 TABLET BY MOUTH  DAILY    Dispense:  90 tablet    Refill:  3  . fluticasone (FLONASE) 50 MCG/ACT nasal spray    Sig: USE 2 SPRAYS IN BOTH  NOSTRILS DAILY    Dispense:  48 g    Refill:  3  . pravastatin (PRAVACHOL) 40 MG tablet    Sig: TAKE 1 TABLET BY MOUTH  DAILY    Dispense:  90 tablet    Refill:  3  . tiotropium (SPIRIVA HANDIHALER) 18 MCG inhalation capsule    Sig: Place 1 capsule (18 mcg total) into inhaler and inhale at bedtime.    Dispense:  90 capsule    Refill:  3  . tamsulosin (FLOMAX) 0.4 MG CAPS capsule    Sig: Take 1 capsule (0.4 mg total) by mouth daily.    Dispense:  90 capsule    Refill:  3   Orders Placed This Encounter  Procedures  . POCT Urinalysis Dipstick (Automated)    Patient instructions: You are doing well today Urinalysis today for urinary urge.  Bring [place] a copy  of your living will.  Return as needed or in 1 year for next physical.   Follow up plan: Return in about 1 year (around 02/12/2021) for annual exam, prior fasting for blood work, medicare wellness visit.  Ria Bush, MD

## 2020-02-13 NOTE — Assessment & Plan Note (Signed)
Weight gain noted. Encourage healthy diet choices.

## 2020-02-13 NOTE — Assessment & Plan Note (Signed)
Preventative protocols reviewed and updated unless pt declined. Discussed healthy diet and lifestyle.  

## 2020-02-13 NOTE — Assessment & Plan Note (Signed)
Appreciate derm care.  

## 2020-02-13 NOTE — Patient Instructions (Addendum)
You are doing well today Urinalysis today for urinary urge.  Bring [place] a copy of your living will.  Return as needed or in 1 year for next physical.   Health Maintenance After Age 73 After age 53, you are at a higher risk for certain long-term diseases and infections as well as injuries from falls. Falls are a major cause of broken bones and head injuries in people who are older than age 15. Getting regular preventive care can help to keep you healthy and well. Preventive care includes getting regular testing and making lifestyle changes as recommended by your health care provider. Talk with your health care provider about:  Which screenings and tests you should have. A screening is a test that checks for a disease when you have no symptoms.  A diet and exercise plan that is right for you. What should I know about screenings and tests to prevent falls? Screening and testing are the best ways to find a health problem early. Early diagnosis and treatment give you the best chance of managing medical conditions that are common after age 28. Certain conditions and lifestyle choices may make you more likely to have a fall. Your health care provider may recommend:  Regular vision checks. Poor vision and conditions such as cataracts can make you more likely to have a fall. If you wear glasses, make sure to get your prescription updated if your vision changes.  Medicine review. Work with your health care provider to regularly review all of the medicines you are taking, including over-the-counter medicines. Ask your health care provider about any side effects that may make you more likely to have a fall. Tell your health care provider if any medicines that you take make you feel dizzy or sleepy.  Osteoporosis screening. Osteoporosis is a condition that causes the bones to get weaker. This can make the bones weak and cause them to break more easily.  Blood pressure screening. Blood pressure changes and  medicines to control blood pressure can make you feel dizzy.  Strength and balance checks. Your health care provider may recommend certain tests to check your strength and balance while standing, walking, or changing positions.  Foot health exam. Foot pain and numbness, as well as not wearing proper footwear, can make you more likely to have a fall.  Depression screening. You may be more likely to have a fall if you have a fear of falling, feel emotionally low, or feel unable to do activities that you used to do.  Alcohol use screening. Using too much alcohol can affect your balance and may make you more likely to have a fall. What actions can I take to lower my risk of falls? General instructions  Talk with your health care provider about your risks for falling. Tell your health care provider if: ? You fall. Be sure to tell your health care provider about all falls, even ones that seem minor. ? You feel dizzy, sleepy, or off-balance.  Take over-the-counter and prescription medicines only as told by your health care provider. These include any supplements.  Eat a healthy diet and maintain a healthy weight. A healthy diet includes low-fat dairy products, low-fat (lean) meats, and fiber from whole grains, beans, and lots of fruits and vegetables. Home safety  Remove any tripping hazards, such as rugs, cords, and clutter.  Install safety equipment such as grab bars in bathrooms and safety rails on stairs.  Keep rooms and walkways well-lit. Activity   Follow a regular  exercise program to stay fit. This will help you maintain your balance. Ask your health care provider what types of exercise are appropriate for you.  If you need a cane or walker, use it as recommended by your health care provider.  Wear supportive shoes that have nonskid soles. Lifestyle  Do not drink alcohol if your health care provider tells you not to drink.  If you drink alcohol, limit how much you have: ? 0-1  drink a day for women. ? 0-2 drinks a day for men.  Be aware of how much alcohol is in your drink. In the [place] one drink equals one typical bottle of beer (12 oz), one-half glass of wine (5 oz), or one shot of hard liquor (1 oz).  Do not use any products that contain nicotine or tobacco, such as cigarettes and e-cigarettes. If you need help quitting, ask your health care provider. Summary  Having a healthy lifestyle and getting preventive care can help to protect your health and wellness after age 73.  Screening and testing are the best way to find a health problem early and help you avoid having a fall. Early diagnosis and treatment give you the best chance for managing medical conditions that are more common for people who are older than age 88.  Falls are a major cause of broken bones and head injuries in people who are older than age 54. Take precautions to prevent a fall at home.  Work with your health care provider to learn what changes you can make to improve your health and wellness and to prevent falls. This information is not intended to replace advice given to you by your health care provider. Make sure you discuss any questions you have with your health care provider. Document Revised: 03/20/2019 Document Reviewed: 10/10/2017 Elsevier Patient Education  2020 [employer].

## 2020-02-13 NOTE — Assessment & Plan Note (Signed)
Continued smoker. Continue spiriva.

## 2020-02-13 NOTE — Telephone Encounter (Signed)
Spoke with pt.  [see Labs, 02/13/20]

## 2020-02-13 NOTE — Assessment & Plan Note (Signed)
Advanced directives: has living will at home for himself and wife. HCPOA - son. Doesn't want prolonged life support. Would want sons to make decision for temporary life support.Asked to bring copy.  

## 2020-02-13 NOTE — Assessment & Plan Note (Addendum)
Chronic, stable. Continue current regimen.  The 10-year ASCVD risk score Mikey Bussing DC Brooke Bonito., et al., 2013) is: 32.8%   Values used to calculate the score:     Age: 74 years     Sex: Male     Is Non-Hispanic African American: Yes     Diabetic: No     Tobacco smoker: Yes     Systolic Blood Pressure: 123XX123 mmHg     Is BP treated: Yes     HDL Cholesterol: 39.1 mg/dL     Total Cholesterol: 182 mg/dL

## 2020-02-13 NOTE — Telephone Encounter (Signed)
Pt returning your call

## 2020-02-13 NOTE — Assessment & Plan Note (Addendum)
Presumed BPH related. Continue flomax.  Noticing increasing urinary urgency - update UA.

## 2020-02-13 NOTE — Assessment & Plan Note (Addendum)
Presumed diagnosis. CBC stable.  

## 2020-02-13 NOTE — Assessment & Plan Note (Signed)
Chronic, stable. Continue current regimen. 

## 2020-02-13 NOTE — Assessment & Plan Note (Signed)
Continue to encourage smoking cessation. Precontemplative.  

## 2020-03-17 DIAGNOSIS — L930 Discoid lupus erythematosus: Secondary | ICD-10-CM | POA: Diagnosis not present

## 2020-03-30 ENCOUNTER — Encounter: Payer: Self-pay | Admitting: Emergency Medicine

## 2020-03-30 ENCOUNTER — Other Ambulatory Visit: Payer: Self-pay

## 2020-03-30 ENCOUNTER — Telehealth: Payer: Self-pay

## 2020-03-30 ENCOUNTER — Emergency Department
Admission: EM | Admit: 2020-03-30 | Discharge: 2020-03-30 | Disposition: A | Discharge status: Home / Self Care | Payer: Medicare Other | Attending: Student in an Organized Health Care Education/Training Program | Admitting: Student in an Organized Health Care Education/Training Program

## 2020-03-30 ENCOUNTER — Emergency Department: Payer: Medicare Other

## 2020-03-30 DIAGNOSIS — Z7982 Long term (current) use of aspirin: Secondary | ICD-10-CM | POA: Insufficient documentation

## 2020-03-30 DIAGNOSIS — Z79899 Other long term (current) drug therapy: Secondary | ICD-10-CM | POA: Diagnosis not present

## 2020-03-30 DIAGNOSIS — I10 Essential (primary) hypertension: Secondary | ICD-10-CM | POA: Diagnosis not present

## 2020-03-30 DIAGNOSIS — F17210 Nicotine dependence, cigarettes, uncomplicated: Secondary | ICD-10-CM | POA: Diagnosis not present

## 2020-03-30 DIAGNOSIS — R1032 Left lower quadrant pain: Secondary | ICD-10-CM

## 2020-03-30 DIAGNOSIS — J449 Chronic obstructive pulmonary disease, unspecified: Secondary | ICD-10-CM | POA: Diagnosis not present

## 2020-03-30 DIAGNOSIS — R109 Unspecified abdominal pain: Secondary | ICD-10-CM | POA: Diagnosis not present

## 2020-03-30 LAB — COMPREHENSIVE METABOLIC PANEL
ALT: 14 U/L (ref 0–44)
AST: 14 U/L — ABNORMAL LOW (ref 15–41)
Albumin: 4 g/dL (ref 3.5–5.0)
Alkaline Phosphatase: 88 U/L (ref 38–126)
Anion gap: 7 (ref 5–15)
BUN: 9 mg/dL (ref 8–23)
CO2: 27 mmol/L (ref 22–32)
Calcium: 9.6 mg/dL (ref 8.9–10.3)
Chloride: 104 mmol/L (ref 98–111)
Creatinine, Ser: 1.02 mg/dL (ref 0.61–1.24)
GFR calc Af Amer: >60 mL/min (ref >60)
GFR calc non Af Amer: >60 mL/min (ref >60)
Glucose, Bld: 97 mg/dL (ref 70–99)
Potassium: 4 mmol/L (ref 3.5–5.1)
Sodium: 138 mmol/L (ref 135–145)
Total Bilirubin: 0.9 mg/dL (ref 0.3–1.2)
Total Protein: 6.5 g/dL (ref 6.5–8.1)

## 2020-03-30 LAB — URINALYSIS, COMPLETE (UACMP) WITH MICROSCOPIC
Bacteria, UA: NONE SEEN
Bilirubin Urine: NEGATIVE
Glucose, UA: NEGATIVE mg/dL
Hgb urine dipstick: NEGATIVE
Ketones, ur: NEGATIVE mg/dL
Leukocytes,Ua: NEGATIVE
Nitrite: NEGATIVE
Protein, ur: NEGATIVE mg/dL
Specific Gravity, Urine: 1.006 (ref 1.005–1.030)
Squamous Epithelial / HPF: NONE SEEN (ref 0–5)
pH: 6 (ref 5.0–8.0)

## 2020-03-30 LAB — CBC
HCT: 48.8 % (ref 39.0–52.0)
Hemoglobin: 15.6 g/dL (ref 13.0–17.0)
MCH: 22.9 pg — ABNORMAL LOW (ref 26.0–34.0)
MCHC: 32 g/dL (ref 30.0–36.0)
MCV: 71.8 fL — ABNORMAL LOW (ref 80.0–100.0)
Platelets: 199 10*3/uL (ref 150–400)
RBC: 6.8 MIL/uL — ABNORMAL HIGH (ref 4.22–5.81)
RDW: 20.9 % — ABNORMAL HIGH (ref 11.5–15.5)
WBC: 5.2 10*3/uL (ref 4.0–10.5)
nRBC: 0 % (ref 0.0–0.2)

## 2020-03-30 LAB — LIPASE, BLOOD: Lipase: 18 U/L (ref 11–51)

## 2020-03-30 IMAGING — CT CT ABD-PELV W/ CM
2 of 5 series · 15 of 46 positions shown, 17 images · IV contrast (APPLIED)
Comparison: CT Abdomen and Pelvis [DATE].

CLINICAL DATA: 73-year-old male with persistent abdominal pain,
left upper quadrant pain which is more severe when lying down.
Diverticulitis suspected.

EXAM:
CT ABDOMEN AND PELVIS WITH CONTRAST
TECHNIQUE: Multidetector CT imaging of the abdomen and pelvis was performed
using the standard protocol following bolus administration of
intravenous contrast.
CONTRAST:  125mL OMNIPAQUE IOHEXOL 300 MG/ML  SOLN

[Series 2: routine abd/pel with · axial · 0.94mm/px · z∈[-516,-26]mm · 12 of 113 slices shown, 14 images]
[im 8/113  soft-tissue]
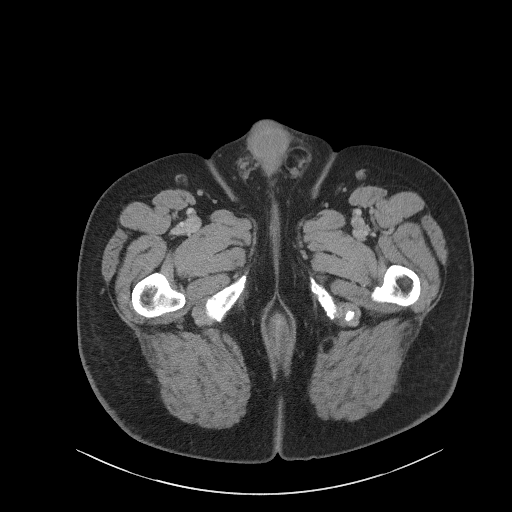
[im 8/113  bone]
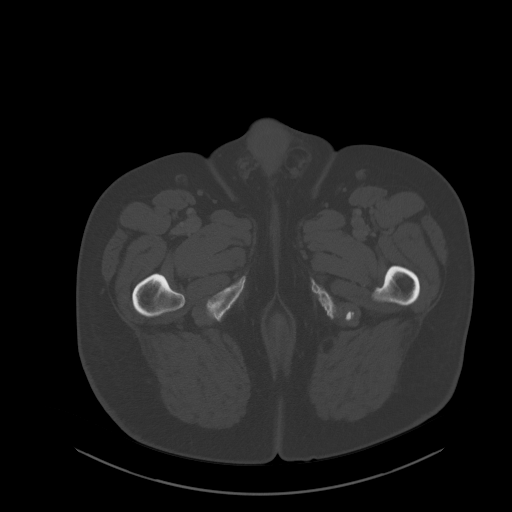
[im 15/113  soft-tissue]
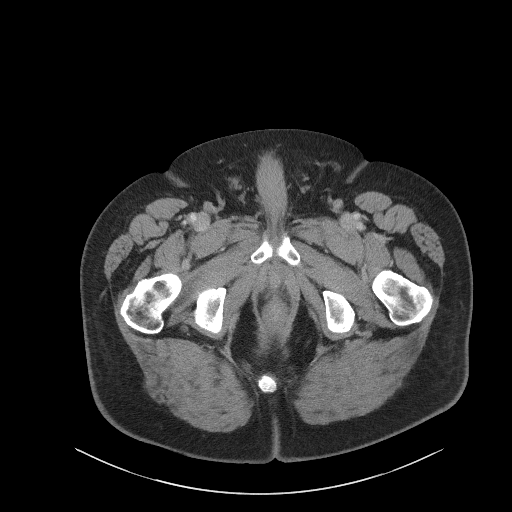
[im 29/113  soft-tissue]
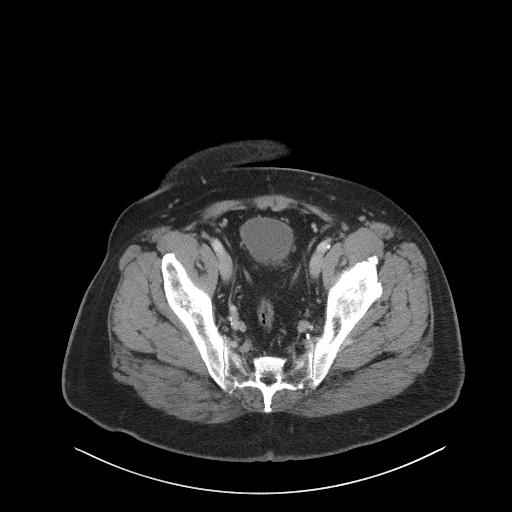
[im 36/113  soft-tissue]
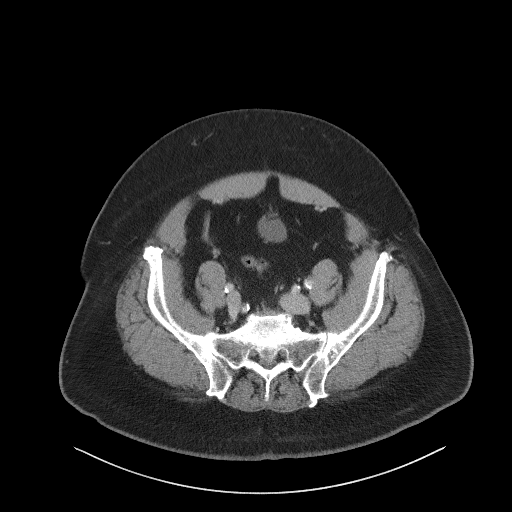
[im 43/113  soft-tissue]
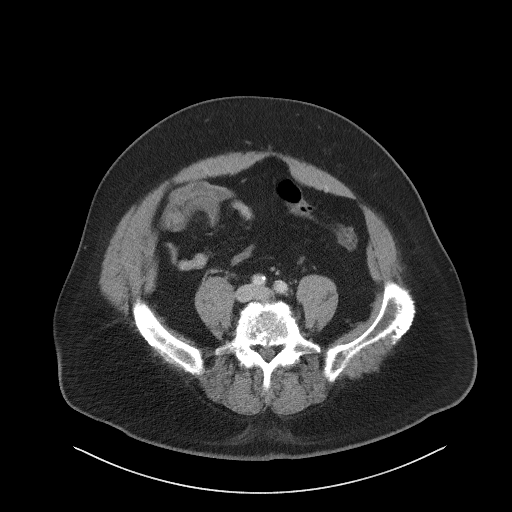
[im 50/113  soft-tissue]
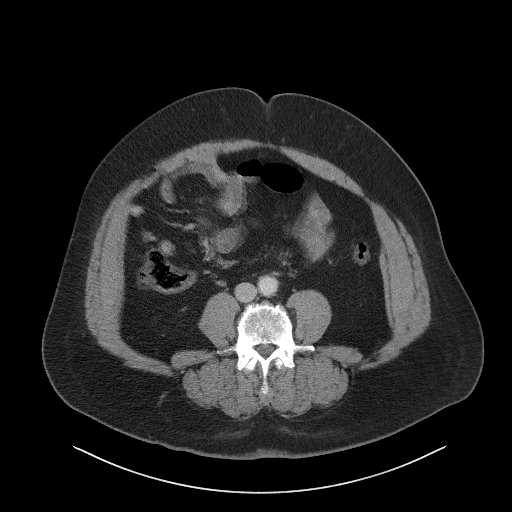
[im 64/113  soft-tissue]
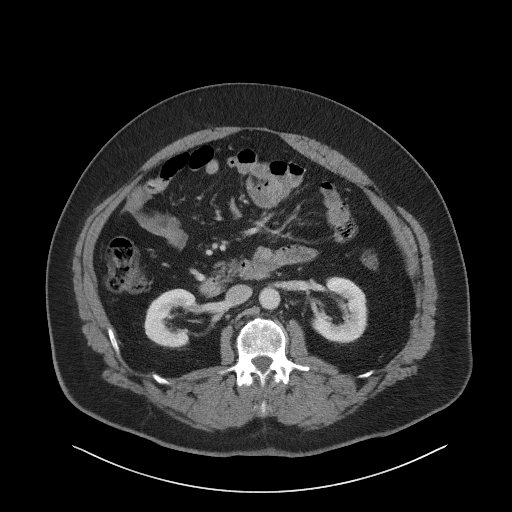
[im 71/113  soft-tissue]
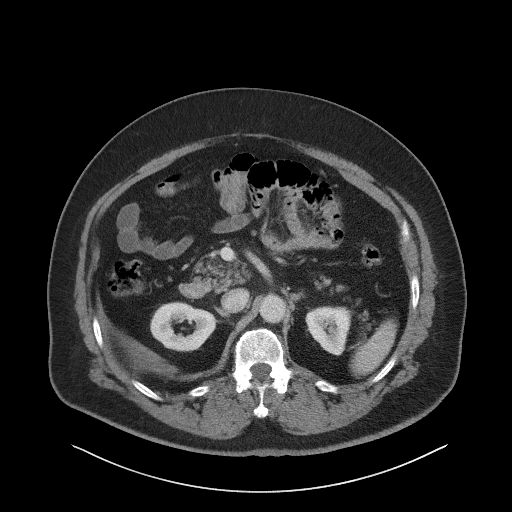
[im 78/113  soft-tissue]
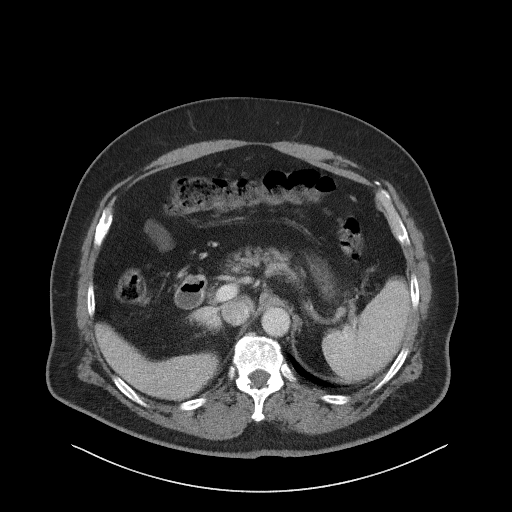
[im 78/113  bone]
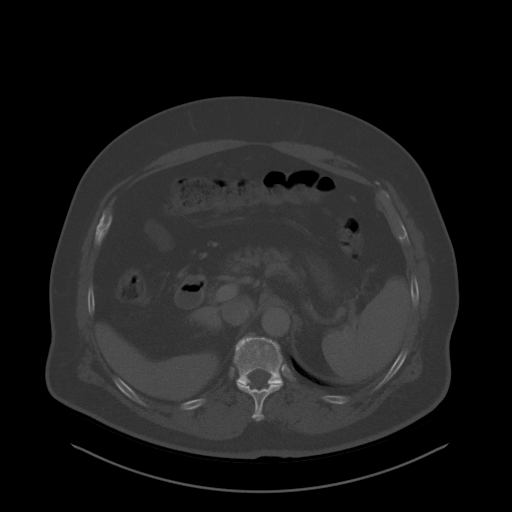
[im 85/113  soft-tissue]
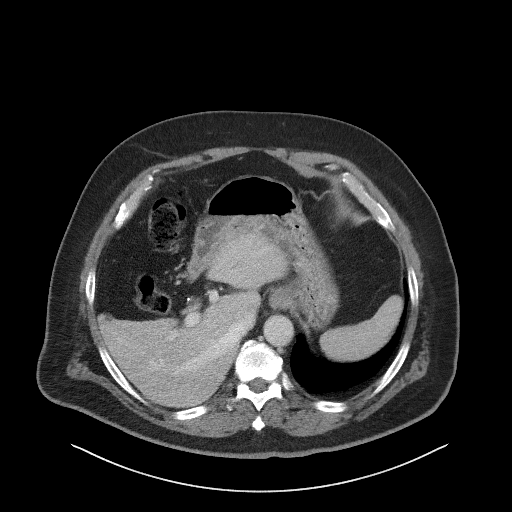
[im 99/113  soft-tissue]
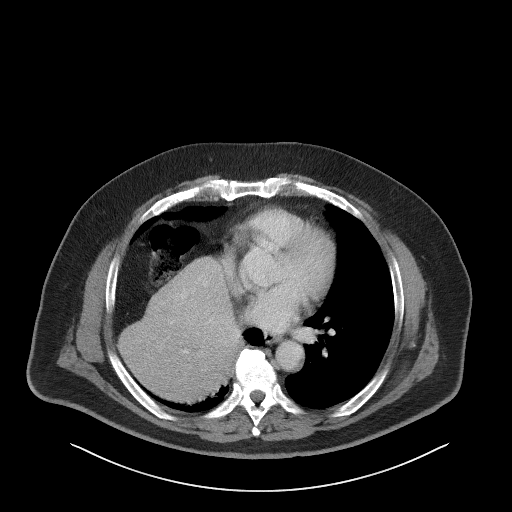
[im 106/113  soft-tissue]
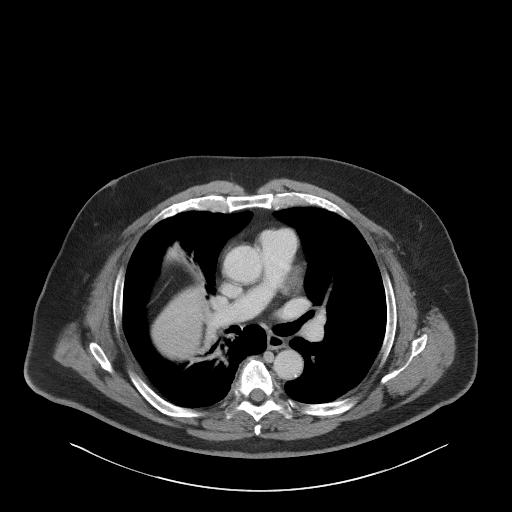

[Series 5: coronal st · coronal · 0.85mm/px · 3 of 120 slices shown]
[im 40/120  soft-tissue]
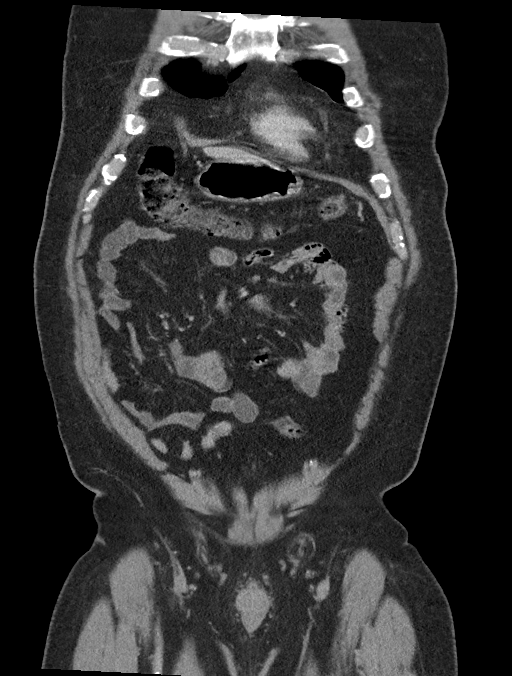
[im 53/120  soft-tissue]
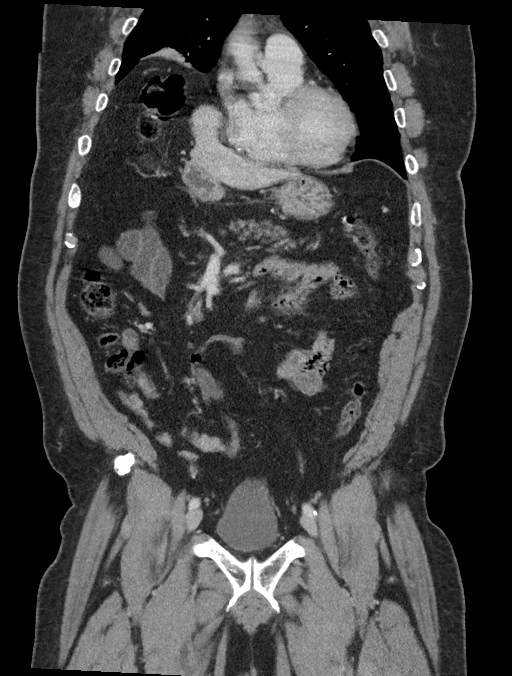
[im 67/120  soft-tissue]
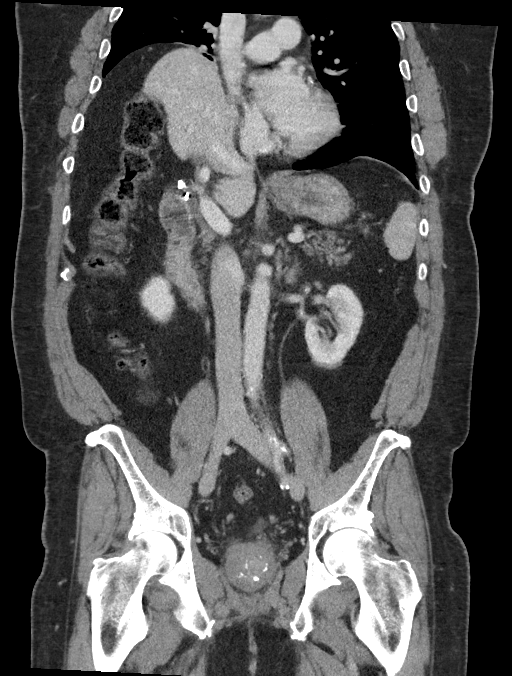

[15 of 46 positions shown; findings below may reference images not displayed]

FINDINGS: Lower chest: Chronic elevation of the right hemidiaphragm with right
lung base atelectasis is stable. Cardiac size remains within normal
limits. No pericardial or pleural effusion.

Hepatobiliary: Gallbladder now surgically absent. No bile duct
enlargement. Chronic polycystic changes in the left hepatic lobe are
stable and appear benign.

Pancreas: Negative aside from chronic fatty atrophy of the pancreas.

Spleen: Stable and normal.

Adrenals/Urinary Tract: Normal adrenal glands.

Symmetric, normal renal enhancement and contrast enhancement with
normal proximal ureters. Normal course of the left ureter.
Diminutive and unremarkable urinary bladder.

Stomach/Bowel: Decompressed and negative rectum, distal sigmoid.
Redundant sigmoid extends to the level of the umbilicus. But there
is only minimal distal descending and proximal sigmoid
diverticulosis with no active inflammation. Decompressed and
negative descending colon. Minimal diverticulosis at the splenic
flexure (coronal image 41) with no active inflammation. Redundant
hepatic flexure which is chronically interposed between the liver
and diaphragm. Negative right colon with diminutive or absent
appendix. No large bowel inflammation. Negative terminal ileum.

No dilated small bowel. Stomach is decompressed and negative. No
free air, free fluid, mesenteric stranding.

Vascular/Lymphatic: Aortoiliac calcified atherosclerosis. Major
arterial structures are patent. Portal venous system is patent. No
lymphadenopathy.

Reproductive: Negative.

Other: No pelvic free fluid.  No abdominal hernia identified.

Musculoskeletal: No acute osseous abnormality identified.
IMPRESSION: 1. Mild diverticulosis of the large bowel, but no active
inflammation.
2. No acute or inflammatory process identified in the abdomen or
pelvis. No explanation for left abdominal pain.
3. Cholecystectomy since the [LX] CT.

## 2020-03-30 MED ORDER — IOHEXOL 300 MG/ML  SOLN
125.0000 mL | Freq: Once | INTRAMUSCULAR | Status: AC | PRN
  Administered 2020-03-30: 13:00:00 125 mL via INTRAVENOUS
  Filled 2020-03-30: qty 125

## 2020-03-30 MED ORDER — CYCLOBENZAPRINE HCL 5 MG PO TABS: 5.0000 mg | ORAL_TABLET | Freq: Three times a day (TID) | ORAL | 0 refills | Status: DC | PRN

## 2020-03-30 MED ORDER — CYCLOBENZAPRINE HCL 5 MG PO TABS: 5.0000 mg | ORAL_TABLET | Freq: Every evening | ORAL | 0 refills | Status: DC | PRN

## 2020-03-30 NOTE — ED Provider Notes (Signed)
[facility] Emergency Department Provider Note    First MD Initiated Contact with Patient 03/30/20 1229     (approximate)  I have reviewed the triage vital signs and the nursing notes.   HISTORY  Chief Complaint Abdominal Pain    HPI Dominic Turner is a 74 y.o. male with the below listed past medical history presents to the ER for several days of progressively worsening left-sided abdominal pain associated with nausea and decreased passing gas and stool.  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  He status post cholecystectomy has not been on any antibiotics.  States the pain is moderate to severe in nature.    Past Medical History:  Diagnosis Date  . Acute cholecystitis 12/2015  . ANXIETY DEPRESSION 10/23/2008  . 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 Bilateral high frequency sensorineural hearing loss    rec hearing aides by ENT  . 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 Post bronchodilator test not improved.  . Diverticulosis 02/2013   by colonoscopy  . Essential hypertension, benign   . History of adenomatous polyp of colon 02/2013   rec rpt 3 yrs  . Overweight(278.02)   . 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 Pure hypercholesterolemia   . Tobacco use disorder   . Unspecified gastritis and gastroduodenitis without mention of hemorrhage   . Unspecified sleep apnea    Family History  Problem Relation Age of Onset  . Cirrhosis Father        died in 76's  . Hypertension Mother   . Heart attack Sister   . Stroke Sister   . Heart attack Maternal Grandmother   . Stroke Maternal Grandmother   . Thyroid disease Sister   . Diabetes Sister   . Cancer Neg Hx    Past Surgical History:  Procedure Laterality Date  . CATARACT EXTRACTION, BILATERAL Bilateral   . CHOLECYSTECTOMY N/A 12/30/2015   Procedure: LAPAROSCOPIC CHOLECYSTECTOMY;  Surgeon: Florene Glen, MD;  Location: [facility] ORS;  Service: General;  Laterality: N/A;  . COLONOSCOPY  03/03/2013   tubular adenoma x3, diverticulosis, rpt 3 yrs (Dr. Cristina Gong)  . COLONOSCOPY  05/2016   TA x3, diverticulosis, single angiodysplastic lesion, rpt 3 yrs (Buccini)  . COLONOSCOPY  09/2019   TA, HP, rpt 5 yrs (Buccini)   Patient Active Problem List   Diagnosis Date Noted  . Constipation 07/22/2019  . Allergic rhinitis 07/22/2019  . 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 Nocturia 04/30/2017  . Seborrheic dermatitis of scalp 01/20/2017  . 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 Advanced care planning/counseling discussion 01/15/2015  . Health maintenance examination 01/15/2015  . COPD, severe ([place]) 04/09/2014  . 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 Medicare annual wellness visit, subsequent 12/27/2012  . Hearing loss 12/27/2012  . ERECTILE DYSFUNCTION 03/25/2009  . OBSTRUCTIVE SLEEP APNEA 06/26/2008  . GASTRITIS 10/09/2007  . COLONIC POLYPS, HX OF 10/09/2007  . PROSTATITIS, HX OF 10/09/2007  . HYPERCHOLESTEROLEMIA, PURE 05/03/2007  . Severe obesity (BMI 35.0-39.9) with comorbidity ([place]) 05/03/2007  . TOBACCO ABUSE 05/03/2007  . HYPERTENSION, BENIGN ESSENTIAL 05/03/2007      Prior to Admission medications   Medication Sig Start Date End Date Taking? Authorizing Provider  acetaminophen (TYLENOL) 500 MG tablet Take 500 mg by mouth every 6 (six) hours  as needed.    [provider]  albuterol (PROVENTIL HFA;VENTOLIN HFA) 108 (90 Base) MCG/ACT inhaler Take 2 puffs in AM & PM every day for next 5 days, & also can use every 4-6 hours as needed. After 5 days, use only every 4-6 hrs as needed 10/31/18   Jodelle Green, FNP  amLODipine (NORVASC) 10 MG tablet TAKE 1 TABLET BY MOUTH  DAILY 02/13/20   Ria Bush, MD  aspirin 81 MG tablet Take 81 mg by mouth daily.      [provider]  azelastine (ASTELIN) 0.1 % nasal spray Place 1 spray into both nostrils 2 (two) times daily. Use in each nostril as directed 04/22/19   Ria Bush, MD  clobetasol (TEMOVATE) 0.05 % external solution Apply topically. 12/25/19   [provider]  cyclobenzaprine (FLEXERIL) 5 MG tablet Take 1 tablet (5 mg total) by mouth at bedtime as needed for muscle spasms. 03/30/20   Merlyn Lot, MD  Docusate Calcium (STOOL SOFTENER PO) Take 1 tablet by mouth daily.    [provider]  fluticasone (CUTIVATE) 0.05 % cream Apply topically. Apply to affected areas on face up to twice a day as needed 11/11/19   [provider]  fluticasone (FLONASE) 50 MCG/ACT nasal spray USE 2 SPRAYS IN BOTH  NOSTRILS DAILY 02/13/20   Ria Bush, MD  ibuprofen (ADVIL,MOTRIN) 600 MG tablet Take 1 tablet (600 mg total) by mouth 3 (three) times daily as needed. Patient taking differently: Take 600 mg by mouth daily as needed.  01/17/16   Ria Bush, MD  loratadine (CLARITIN) 10 MG tablet Take 1 tablet (10 mg total) by mouth daily. 07/22/19   Ria Bush, MD  Multiple Vitamin Essential TABS Take 1 tablet by mouth daily.      [provider]  polyethylene glycol powder (GLYCOLAX/MIRALAX) 17 GM/SCOOP powder Take 17 g by mouth daily. 07/22/19   Ria Bush, MD  pravastatin (PRAVACHOL) 40 MG tablet TAKE 1 TABLET BY MOUTH  DAILY 02/13/20   Ria Bush, MD  tamsulosin (FLOMAX) 0.4 MG CAPS capsule Take 1 capsule (0.4 mg total) by mouth daily. 02/13/20   Ria Bush, MD  tiotropium (SPIRIVA HANDIHALER) 18 MCG inhalation capsule Place 1 capsule (18 mcg total) into inhaler and inhale at bedtime. 02/13/20   Ria Bush, MD    Allergies Aspirin, Sertraline hcl, Sulfonamide derivatives, and Wellbutrin [bupropion]    Social History Social History   Tobacco Use  . Smoking status: Current Every Day Smoker    Packs/day: 0.50    Years: 48.00    Pack  years: 24.00    Types: Cigarettes    Start date: 12/12/1983  . Smokeless tobacco: Never Used  Substance Use Topics  . Alcohol use: No    Alcohol/week: 0.0 standard drinks  . Drug use: No    Review of Systems Patient denies headaches, rhinorrhea, blurry vision, numbness, shortness of breath, chest pain, edema, cough, abdominal pain, nausea, vomiting, diarrhea, dysuria, fevers, rashes or hallucinations unless otherwise stated above in HPI. ____________________________________________   PHYSICAL EXAM:  VITAL SIGNS: Vitals:   03/30/20 0953  BP: (!) 149/71  Pulse: 88  Resp: 20  Temp: 97.7 F (36.5 C)  SpO2: 98%    Constitutional: Alert and oriented.  Eyes: Conjunctivae are normal.  Head: Atraumatic. Nose: No congestion/rhinnorhea. Mouth/Throat: Mucous membranes are moist.   Neck: No stridor. Painless ROM.  Cardiovascular: Normal rate, regular rhythm. Grossly normal heart sounds.  Good peripheral circulation. Respiratory: Normal respiratory effort.  No retractions. Lungs CTAB. Gastrointestinal: Soft, obese, left sided ttp. No distention. No abdominal bruits. No CVA tenderness. Genitourinary:  Musculoskeletal: No lower extremity tenderness nor edema.  No joint effusions. Neurologic:  Normal speech and language. No gross focal neurologic deficits are appreciated. No facial droop Skin:  Skin is warm, dry and intact. No rash noted. Psychiatric: Mood and affect are normal. Speech and behavior are normal.  ____________________________________________   LABS (all labs ordered are listed, but only abnormal results are displayed)  Results for orders placed or performed during the hospital encounter of 03/30/20 (from the past 24 hour(s))  Lipase, blood     Status: None   Collection Time: 03/30/20  9:58 AM  Result Value Ref Range   Lipase 18 11 - 51 U/L  Comprehensive metabolic panel     Status: Abnormal   Collection Time: 03/30/20  9:58 AM  Result Value Ref Range   Sodium 138  135 - 145 mmol/L   Potassium 4.0 3.5 - 5.1 mmol/L   Chloride 104 98 - 111 mmol/L   CO2 27 22 - 32 mmol/L   Glucose, Bld 97 70 - 99 mg/dL   BUN 9 8 - 23 mg/dL   Creatinine, Ser 1.02 0.61 - 1.24 mg/dL   Calcium 9.6 8.9 - 10.3 mg/dL   Total Protein 6.5 6.5 - 8.1 g/dL   Albumin 4.0 3.5 - 5.0 g/dL   AST 14 (L) 15 - 41 U/L   ALT 14 0 - 44 U/L   Alkaline Phosphatase 88 38 - 126 U/L   Total Bilirubin 0.9 0.3 - 1.2 mg/dL   GFR calc non Af Amer >60 >60 mL/min   GFR calc Af Amer >60 >60 mL/min   Anion gap 7 5 - 15  CBC     Status: Abnormal   Collection Time: 03/30/20  9:58 AM  Result Value Ref Range   WBC 5.2 4.0 - 10.5 K/uL   RBC 6.80 (H) 4.22 - 5.81 MIL/uL   Hemoglobin 15.6 13.0 - 17.0 g/dL   HCT 48.8 39.0 - 52.0 %   MCV 71.8 (L) 80.0 - 100.0 fL   MCH 22.9 (L) 26.0 - 34.0 pg   MCHC 32.0 30.0 - 36.0 g/dL   RDW 20.9 (H) 11.5 - 15.5 %   Platelets 199 150 - 400 K/uL   nRBC 0.0 0.0 - 0.2 %  Urinalysis, Complete w Microscopic     Status: Abnormal   Collection Time: 03/30/20  9:59 AM  Result Value Ref Range   Color, Urine STRAW (A) YELLOW   APPearance CLEAR (A) CLEAR   Specific Gravity, Urine 1.006 1.005 - 1.030   pH 6.0 5.0 - 8.0   Glucose, UA NEGATIVE NEGATIVE mg/dL   Hgb urine dipstick NEGATIVE NEGATIVE   Bilirubin Urine NEGATIVE NEGATIVE   Ketones, ur NEGATIVE NEGATIVE mg/dL   Protein, ur NEGATIVE NEGATIVE mg/dL   Nitrite NEGATIVE NEGATIVE   Leukocytes,Ua NEGATIVE NEGATIVE   WBC, UA 0-5 0 - 5 WBC/hpf   Bacteria, UA NONE SEEN NONE SEEN   Squamous Epithelial / LPF NONE SEEN 0 - 5   Mucus PRESENT    ____________________________________________  EKG My review and personal interpretation at Time:   9:56 Indication:abd pain  Rate: 85  Rhythm: sinus Axis: normal Other: normal intervals, no stemi ____________________________________________  RADIOLOGY  I personally reviewed all radiographic images ordered to evaluate for the above acute complaints and reviewed radiology reports  and findings.  These findings were personally discussed with the  patient.  Please see medical record for radiology report.  ____________________________________________   PROCEDURES  Procedure(s) performed:  Procedures    Critical Care performed: no ____________________________________________   INITIAL IMPRESSION / ASSESSMENT AND PLAN / ED COURSE  Pertinent labs & imaging results that were available during my care of the patient were reviewed by me and considered in my medical decision making (see chart for details).   DDX: Enteritis, diverticulitis, mass, constipation, SBO, hernia  Dominic Turner is a 74 y.o. who presents to the ED with was as described above.  Blood work is fairly reassuring but patient is obese with age risk factors and somewhat limited exam secondary to his obesity.  Given his symptoms will order CT imaging to evaluate for the by differential.   CT imaging is reassuring.  Patient does report pain that is worsened when laying back does seem musculoskeletal.  States he has been doing more yard work.  Suspect strained muscle given reassuring imaging.  At this point do believe he is cleared and appropriate for outpatient follow-up.  The patient was evaluated in Emergency Department today for the symptoms described in the history of present illness. He/she was evaluated in the context of the global COVID-19 pandemic, which necessitated consideration that the patient might be at risk for infection with the SARS-CoV-2 virus that causes COVID-19. Institutional protocols and algorithms that pertain to the evaluation of patients at risk for COVID-19 are in a state of rapid change based on information released by regulatory bodies including the CDC and federal and state organizations. These policies and algorithms were followed during the patient's care in the ED.  As part of my medical decision making, I reviewed the following data within the [facility] notes reviewed and incorporated, Labs reviewed, notes from prior ED visits and [place] Controlled Substance Database   ____________________________________________   FINAL CLINICAL IMPRESSION(S) / ED DIAGNOSES  Final diagnoses:  Left lower quadrant abdominal pain      NEW MEDICATIONS STARTED DURING THIS VISIT:  Current Discharge Medication List    START taking these medications   Details  cyclobenzaprine (FLEXERIL) 5 MG tablet Take 1 tablet (5 mg total) by mouth at bedtime as needed for muscle spasms. Qty: 6 tablet, Refills: 0         Note:  This document was prepared using Dragon voice recognition software and may include unintentional dictation errors.    Merlyn Lot, MD 03/30/20 1401

## 2020-03-30 NOTE — Telephone Encounter (Signed)
[facility] Night - Client TELEPHONE ADVICE RECORD [facility] Patient Name: Dominic Turner Gender: Male DOB: [birth year] Age: 74 Y 104 M 22 D Return Phone Number: [phone] (Primary), [phone] (Secondary) Address: [place] [address] Client [facility] Night - Client Client Site [facility] Physician Dominic Turner - MD Contact Type Call Who Is Calling Patient / Member / Family / Caregiver Call Type Triage / Clinical Relationship To Patient Self Return Phone Number [phone] (Primary) Chief Complaint ABDOMINAL PAIN - Severe and only in abdomen Reason for Call Symptomatic / Request for Dominic Turner states he was unable to sleep last night due to pain in his left side. He states this is a severe pain. Translation No Nurse Assessment Nurse: Dominic China, RN, Dominic Turner Date/Time Dominic Turner Time): 03/30/2020 7:31:42 AM Confirm and document reason for call. If symptomatic, describe symptoms. ---Caller states he has intermittent pain in his left side. It has been intermittent for last couple of months but last night his pain was severe. Pain in left side is intermittent - Pain 10/10. No fever, cough or SOB. Has the patient had close contact with a person known or suspected to have the novel coronavirus illness OR traveled / lives in area with major community spread (including international travel) in the last 14 days from the onset of symptoms? * If Asymptomatic, screen for exposure and travel within the last 14 days. ---No Does the patient have any new or worsening symptoms? ---Yes Will a triage be completed? ---Yes Related visit to physician within the last 2 weeks? ---No Does the PT have any chronic conditions? (i.e. diabetes, asthma, this includes High risk factors for pregnancy, etc.) ---Yes List chronic conditions. ---HTN Is this a behavioral health  or substance abuse call? ---No Guidelines Guideline Title Affirmed Question Affirmed Notes Nurse Date/Time Dominic Turner Time) Abdominal Pain - Upper [1] Pain lasts > 10 minutes AND [2] age > 23 Dominic Turner 03/30/2020 7:35:49 AM PLEASE NOTE: All timestamps contained within this report are represented as [place] Standard Time. CONFIDENTIALTY NOTICE: This fax transmission is intended only for the addressee. It contains information that is legally privileged, confidential or otherwise protected from use or disclosure. If you are not the intended recipient, you are strictly prohibited from reviewing, disclosing, copying using or disseminating any of this information or taking any action in reliance on or regarding this information. If you have received this fax in error, please notify us immediately by telephone so that we can arrange for its return to [place]. Phone: [phone], Toll-Free: [phone], Fax: [phone] Page: 2 of 2 Call Id: [id number] [place]. Time Dominic Turner Time) Disposition Final User 03/30/2020 7:30:44 AM Send to Urgent Queue Dominic Turner 03/30/2020 7:39:04 AM Go to ED Now Yes Dominic China, RN, Dominic Turner Disagree/Comply Comply Caller Understands Yes PreDisposition Call Doctor Care Advice Given Per Guideline GO TO ED NOW: * You need to be seen in the Emergency Department. * Go to the ED at ___________ Hospital. DRIVING: * Another adult should drive. * Do not delay going to the Emergency Department. * If immediate transportation is not available via car or taxi, then the patient should be instructed to call [id number]. BRING MEDICINES: * Please bring a list of your current medicines when you go to the Emergency Department (ER). * It is also a good idea to bring the pill bottles too. This will help the doctor to make certain you are taking the right medicines and  the right dose. CALL EMS 911 IF: * Confusion occurs * Passes out or becomes too weak to stand * Severe difficulty breathing  occurs. CARE ADVICE given per Abdominal Pain, Upper (Adult) guideline. Comments User: Dominic Moccasin, RN Date/Time Dominic Turner Time): 03/30/2020 7:37:04 AM Pain is relieved when caller sits up and stands. Pain occurs when he lays down. Referrals [facility] - ED

## 2020-03-30 NOTE — Telephone Encounter (Signed)
Noted. Agree will await ER eval.

## 2020-03-30 NOTE — Discharge Instructions (Signed)

## 2020-03-30 NOTE — Telephone Encounter (Signed)
I spoke with pt and he is on his way to [facility] ED now; pt said the pain is so bad he cannot lay down. FYI to Dr Darnell Level.

## 2020-03-30 NOTE — ED Triage Notes (Signed)
Pt reports pain to his left upper abd for a couple of months now. Pt reports he thought it was gas so his MD put him on a probiotic but the pain has continued. Pt reports last night it was hard for him to sleep due to the pain. Pt reports pain gets more severe when he tries to lay down. Pt reports called his MD and was advised to come to the ED for evaluation.

## 2020-03-30 NOTE — ED Notes (Signed)
Pt ambulatory from lobby in NAD, reports LLQ pain that feels like gas. Reports this has happened before but last night his pain was worse and did not go away with change in position. Reports pain 5/10 at this time.

## 2020-03-31 DIAGNOSIS — R140 Abdominal distension (gaseous): Secondary | ICD-10-CM | POA: Diagnosis not present

## 2020-03-31 DIAGNOSIS — K5904 Chronic idiopathic constipation: Secondary | ICD-10-CM | POA: Diagnosis not present

## 2020-03-31 DIAGNOSIS — R1032 Left lower quadrant pain: Secondary | ICD-10-CM | POA: Diagnosis not present

## 2020-03-31 NOTE — Telephone Encounter (Signed)
Seen at Er with reassuring labs and CT.  Thought MSK after increase in yard work.

## 2020-04-02 ENCOUNTER — Encounter: Payer: Self-pay | Admitting: Family Medicine

## 2020-04-02 DIAGNOSIS — R109 Unspecified abdominal pain: Secondary | ICD-10-CM | POA: Insufficient documentation

## 2020-05-27 ENCOUNTER — Telehealth: Payer: Self-pay | Admitting: Family Medicine

## 2020-05-27 MED ORDER — SPIRIVA HANDIHALER 18 MCG IN CAPS: 18.0000 ug | ORAL_CAPSULE | Freq: Every day | RESPIRATORY_TRACT | 0 refills | Status: DC

## 2020-05-27 NOTE — Telephone Encounter (Signed)
Pt states that he was late getting his Rx called in to [facility] for Spiriva Inhaler - pt is going to be out for 5-7 days. Pt is requesting a one month supply to [facility]. Needs this sent in today.  Please advise, thanks.

## 2020-05-27 NOTE — Telephone Encounter (Signed)
E-scribed 30-day refill to [facility].   Spoke with pt notifying him of refill.

## 2020-07-05 DIAGNOSIS — H1045 Other chronic allergic conjunctivitis: Secondary | ICD-10-CM | POA: Diagnosis not present

## 2020-07-05 DIAGNOSIS — H0102A Squamous blepharitis right eye, upper and lower eyelids: Secondary | ICD-10-CM | POA: Diagnosis not present

## 2020-07-05 DIAGNOSIS — H0102B Squamous blepharitis left eye, upper and lower eyelids: Secondary | ICD-10-CM | POA: Diagnosis not present

## 2020-07-05 DIAGNOSIS — H04123 Dry eye syndrome of bilateral lacrimal glands: Secondary | ICD-10-CM | POA: Diagnosis not present

## 2020-07-05 DIAGNOSIS — H353111 Nonexudative age-related macular degeneration, right eye, early dry stage: Secondary | ICD-10-CM | POA: Diagnosis not present

## 2020-11-18 ENCOUNTER — Other Ambulatory Visit: Payer: Self-pay | Admitting: Family Medicine

## 2020-12-06 ENCOUNTER — Telehealth: Payer: Self-pay

## 2020-12-06 ENCOUNTER — Other Ambulatory Visit: Payer: Self-pay

## 2020-12-06 ENCOUNTER — Other Ambulatory Visit: Payer: Self-pay | Admitting: Family Medicine

## 2020-12-06 ENCOUNTER — Encounter: Payer: Self-pay | Admitting: Family Medicine

## 2020-12-06 ENCOUNTER — Telehealth (INDEPENDENT_AMBULATORY_CARE_PROVIDER_SITE_OTHER): Payer: Medicare Other | Admitting: Family Medicine

## 2020-12-06 DIAGNOSIS — J069 Acute upper respiratory infection, unspecified: Secondary | ICD-10-CM

## 2020-12-06 NOTE — Assessment & Plan Note (Signed)
Anticipate viral given short duration.  Supportive care at home reviewed.  Reasonable to send for COVID test - will call to schedule. Will also check flu swab.  Update if not improving as expected. Pt agrees with plan.

## 2020-12-06 NOTE — Progress Notes (Signed)
   Dominic Turner - 74 y.o. male  MRN [id number]  Date of Birth: [birth year]  PCP: Eustaquio Boyden, MD  This service was provided via telemedicine. Phone Visit performed on 12/06/2020    Rationale for phone visit along with limitations reviewed. I discussed the limitations, risks, security and privacy concerns of performing a phone visit and the availability of in person appointments. I also discussed with the patient that there may be a patient responsible charge related to this service. Patient consented to telephone encounter.    Location of patient: home Location of provider: office, [place] @ [facility] Name of referring provider: N/A   Names of persons and role in encounter: Provider: Eustaquio Boyden, MD  Patient: Dominic Turner  Other: N/A   Time on call: 3:15pm - 3:26pm   Subjective: Chief Complaint  Patient presents with  . Cough    C/o cough, diarrhea, HA and vomiting.  Sxs started 12/03/20.  Vomiting and diarrhea have resolved.  HA feels like sinus congestion causing cough.  Concerned because he can't see wife in nursing home until he has neg COVID test.      HPI:  4d h/o vomiting, diarrhea for about an hour (since resolved) now with low grade fever and sinus congestion headache associated with PNdrainage. Doesn't have thermometer. Head > chest congestion. Ongoing mild dry cough, able to sleep despite this.   No body aches, abd pain, loss of taste or smell.  So far has tried mucinex and tylenol with benefit.   No sick contacts at home.  No known COVID exposure.   Needs negative covid test before he can return to see wife who lives in [place].   He is fully vaccinated with moderna with booster 09/2020   Objective/Observations:  No physical exam or vital signs collected unless specifically identified below.   Temp 97.8 F (36.6 C)   Ht 5\' 9"  (1.753 m)   Wt 243 lb (110.2 kg)   BMI 35.88 kg/m    Respiratory status: speaks in complete sentences  without evident shortness of breath.   Assessment/Plan:  Viral URI with cough Anticipate viral given short duration.  Supportive care at home reviewed.  Reasonable to send for COVID test - will call to schedule. Will also check flu swab.  Update if not improving as expected. Pt agrees with plan.    I discussed the assessment and treatment plan with the patient. The patient was provided an opportunity to ask questions and all were answered. The patient agreed with the plan and demonstrated an understanding of the instructions.  Lab Orders  No laboratory test(s) ordered today    No orders of the defined types were placed in this encounter.   The patient was advised to call back or seek an in-person evaluation if the symptoms worsen or if the condition fails to improve as anticipated.  , MD

## 2020-12-06 NOTE — Telephone Encounter (Signed)
Per appt notes pt has virtual visit with Dr Reece Agar 12/06/20 at 3pm.

## 2020-12-06 NOTE — Telephone Encounter (Signed)
[facility] Night - Dominic TELEPHONE ADVICE RECORD [facility] Patient Name: Dominic Turner Gender: Male DOB: 11/10/1946 Age: 74 Y 28 D Return Phone Number: [phone] (Primary), [phone] (Secondary) Address: [place] [address] Dominic Turner [facility] Night - Dominic Dominic Site [facility] - Night Physician Eustaquio Boyden - MD Contact Type Call Who Is Calling Patient / Member / Family / Caregiver Call Type Triage / Clinical Relationship To Patient Self Return Phone Number [phone] (Primary) Chief Complaint Fever (non urgent symptom) (> THREE MONTHS) Reason for Call Request to Schedule Office Appointment Initial Comment Caller states he needs to make an apt because he is having flu-symptoms. Caller states he has a low-grade fever and congestion. Caller states he had diarrhea and vomiting on Friday night but it stopped. Caller states he needs to know if he has the flu. Translation No Nurse Assessment Nurse: Kizzie Bane, RN, Marylene Land Date/Time (Eastern Time): 12/06/2020 8:12:01 AM Confirm and document reason for call. If symptomatic, describe symptoms. ---Caller states he has had congestion and a fever since Friday. He has a bit of a cough as well. Does the patient have any new or worsening symptoms? ---Yes Will a triage be completed? ---Yes Related visit to physician within the last 2 weeks? ---No Does the PT have any chronic conditions? (i.e. diabetes, asthma, this includes High risk factors for pregnancy, etc.) ---Yes List chronic conditions. ---copd Is this a behavioral health or substance abuse call? ---No Guidelines Guideline Title Affirmed Question Affirmed Notes Nurse Date/Time (Eastern Time) Influenza - Seasonal Fever present > 3 days (72 hours) Kizzie Bane, RN, Marylene Land 12/06/2020 8:13:52 AM Disp. Time Lamount Cohen Time) Disposition Final User 12/06/2020 8:18:07 AM See PCP within 24  Hours Yes Kizzie Bane, RN, Marylene Land PLEASE NOTE: All timestamps contained within this report are represented as [place] Standard Time. CONFIDENTIALTY NOTICE: This fax transmission is intended only for the addressee. It contains information that is legally privileged, confidential or otherwise protected from use or disclosure. If you are not the intended recipient, you are strictly prohibited from reviewing, disclosing, copying using or disseminating any of this information or taking any action in reliance on or regarding this information. If you have received this fax in error, please notify us immediately by telephone so that we can arrange for its return to [place]. Phone: [phone], Toll-Free: [phone], Fax: [phone] Page: 2 of 2 Call Id: [id number] Caller Disagree/Comply Comply Caller Understands Yes PreDisposition Did not know what to do Care Advice Given Per Guideline SEE PCP WITHIN 24 HOURS: * Cough: Use cough drops. * Feeling dehydrated: Drink extra liquids. If the air in your home is dry, use a humidifier. * Fever: For fever over 101 F (38.3 C), take acetaminophen every 4 to 6 hours (Adults 650 mg) OR ibuprofen every 6 to 8 hours (Adults 400 mg). * Muscle aches, headache, and other pains: Often this comes and goes with the fever. Take acetaminophen every 4 to 6 hours (Adults 650 mg) OR ibuprofen every 6 to 8 hours (Adults 400 mg). * Sore throat: Try throat lozenges, hard candy or warm chicken broth. CARE ADVICE given per Influenza - Seasonal (Adult) guideline. CALL BACK IF: * Fever over 104 F (40 C) * Difficulty breathing occurs * You become worse Referrals REFERRED TO PCP OFFICE

## 2020-12-07 ENCOUNTER — Other Ambulatory Visit (INDEPENDENT_AMBULATORY_CARE_PROVIDER_SITE_OTHER): Payer: Medicare Other

## 2020-12-07 DIAGNOSIS — J069 Acute upper respiratory infection, unspecified: Secondary | ICD-10-CM | POA: Diagnosis not present

## 2020-12-07 LAB — POC INFLUENZA A&B (BINAX/QUICKVUE)
Influenza A, POC: NEGATIVE
Influenza B, POC: NEGATIVE

## 2020-12-07 NOTE — Addendum Note (Signed)
Addended by: Donnamarie Poag on: 12/07/2020 02:28 PM   Modules accepted: Orders

## 2020-12-08 ENCOUNTER — Telehealth: Payer: Self-pay | Admitting: Family Medicine

## 2020-12-08 NOTE — Telephone Encounter (Signed)
Pt called in wanted to know about getting something for his sinus infection, I did inform him that he would need to set up an appointment but he wanted Dr. Reece Agar nurse Misty Stanley) to call him.

## 2020-12-09 LAB — SARS-COV-2, NAA 2 DAY TAT

## 2020-12-09 LAB — NOVEL CORONAVIRUS, NAA: SARS-CoV-2, NAA: NOT DETECTED

## 2020-12-09 MED ORDER — GUAIFENESIN-CODEINE 100-10 MG/5ML PO SYRP: 5.0000 mL | ORAL_SOLUTION | Freq: Three times a day (TID) | ORAL | 0 refills | Status: DC | PRN

## 2020-12-09 NOTE — Addendum Note (Signed)
Addended by: Eustaquio Boyden on: 12/09/2020 11:31 AM   Modules accepted: Orders

## 2020-12-09 NOTE — Telephone Encounter (Signed)
Spoke with pt relaying Dr. G's message.  Pt verbalizes understanding and expresses his thanks.  

## 2020-12-09 NOTE — Telephone Encounter (Signed)
Returned pt's call.  He is requesting something stronger than OTC meds to help with sinus congestion.  Says the drainage is causing him to cough.  Pt aware Dr. Reece Agar is out of the office.

## 2020-12-09 NOTE — Telephone Encounter (Signed)
Recommend he take antihistamine like claritin or zyrtec for drainage.  I have also sent codeine cough syrup for him to take at night time.

## 2020-12-16 ENCOUNTER — Other Ambulatory Visit: Payer: Self-pay | Admitting: Family Medicine

## 2020-12-16 DIAGNOSIS — H903 Sensorineural hearing loss, bilateral: Secondary | ICD-10-CM | POA: Diagnosis not present

## 2020-12-17 MED ORDER — AMLODIPINE BESYLATE 10 MG PO TABS: 10.0000 mg | ORAL_TABLET | Freq: Every day | ORAL | 0 refills | Status: DC

## 2020-12-17 MED ORDER — PRAVASTATIN SODIUM 40 MG PO TABS: 40.0000 mg | ORAL_TABLET | Freq: Every day | ORAL | 0 refills | Status: DC

## 2020-12-17 MED ORDER — TAMSULOSIN HCL 0.4 MG PO CAPS: 0.4000 mg | ORAL_CAPSULE | Freq: Every day | ORAL | 0 refills | Status: DC

## 2020-12-17 NOTE — Addendum Note (Signed)
Addended by: Carter Kitten on: 12/17/2020 04:48 PM   Modules accepted: Orders

## 2020-12-17 NOTE — Telephone Encounter (Signed)
Received refill request from [facility]  Refills were sent in today by Dr. Darnell Level to [facility].  I called to verify with patient which pharmacy he is now using.  He states as of 12/11/2020 he is using [facility].  Refills resent to [facility].

## 2020-12-20 ENCOUNTER — Telehealth: Payer: Self-pay

## 2020-12-20 NOTE — Telephone Encounter (Signed)
Patient came into office about needing Rx sent into the pharmacy and updating his insurance this medication is the only one that goes to this pharmacy also    [place] 18 MCG inhalation capsule   [facility] [place], [place]   Please advise

## 2020-12-21 NOTE — Telephone Encounter (Signed)
Patient left a voicemail stating that he needs this prescription sent to [facility]. Patient stated that he only has a couple of days left.

## 2020-12-22 MED ORDER — SPIRIVA HANDIHALER 18 MCG IN CAPS: ORAL_CAPSULE | RESPIRATORY_TRACT | 0 refills | Status: DC

## 2020-12-22 NOTE — Telephone Encounter (Signed)
Patient called and said he was returning Lisa's call.  Please call patient back at [phone] or [phone].

## 2020-12-22 NOTE — Telephone Encounter (Signed)
E-scribed refill to [facility] [address]  Pt made aware by phn.  Expresses his thanks.

## 2020-12-23 DIAGNOSIS — H903 Sensorineural hearing loss, bilateral: Secondary | ICD-10-CM | POA: Diagnosis not present

## 2020-12-24 ENCOUNTER — Other Ambulatory Visit: Payer: Self-pay

## 2020-12-24 MED ORDER — FLUTICASONE PROPIONATE 50 MCG/ACT NA SUSP: NASAL | 3 refills | Status: AC

## 2021-02-13 ENCOUNTER — Other Ambulatory Visit: Payer: Self-pay | Admitting: Family Medicine

## 2021-02-13 DIAGNOSIS — I10 Essential (primary) hypertension: Secondary | ICD-10-CM

## 2021-02-13 DIAGNOSIS — E7800 Pure hypercholesterolemia, unspecified: Secondary | ICD-10-CM

## 2021-02-13 DIAGNOSIS — Z125 Encounter for screening for malignant neoplasm of prostate: Secondary | ICD-10-CM

## 2021-02-13 DIAGNOSIS — D563 Thalassemia minor: Secondary | ICD-10-CM

## 2021-02-15 ENCOUNTER — Ambulatory Visit (INDEPENDENT_AMBULATORY_CARE_PROVIDER_SITE_OTHER): Payer: Medicare HMO

## 2021-02-15 ENCOUNTER — Other Ambulatory Visit (INDEPENDENT_AMBULATORY_CARE_PROVIDER_SITE_OTHER): Payer: Medicare HMO

## 2021-02-15 ENCOUNTER — Other Ambulatory Visit: Payer: Self-pay

## 2021-02-15 DIAGNOSIS — D563 Thalassemia minor: Secondary | ICD-10-CM

## 2021-02-15 DIAGNOSIS — Z0000 Encounter for general adult medical examination without abnormal findings: Secondary | ICD-10-CM | POA: Diagnosis not present

## 2021-02-15 DIAGNOSIS — Z125 Encounter for screening for malignant neoplasm of prostate: Secondary | ICD-10-CM | POA: Diagnosis not present

## 2021-02-15 DIAGNOSIS — E7800 Pure hypercholesterolemia, unspecified: Secondary | ICD-10-CM | POA: Diagnosis not present

## 2021-02-15 DIAGNOSIS — I10 Essential (primary) hypertension: Secondary | ICD-10-CM

## 2021-02-15 LAB — LIPID PANEL
Cholesterol: 175 mg/dL (ref 0–200)
HDL: 39.8 mg/dL (ref >39.00)
LDL Cholesterol: 111 mg/dL — ABNORMAL HIGH (ref 0–99)
NonHDL: 135.34
Total CHOL/HDL Ratio: 4
Triglycerides: 120 mg/dL (ref 0.0–149.0)
VLDL: 24 mg/dL (ref 0.0–40.0)

## 2021-02-15 LAB — CBC WITH DIFFERENTIAL/PLATELET
Basophils Absolute: 0.1 10*3/uL (ref 0.0–0.1)
Basophils Relative: 1.9 % (ref 0.0–3.0)
Eosinophils Absolute: 0.3 10*3/uL (ref 0.0–0.7)
Eosinophils Relative: 5.1 % — ABNORMAL HIGH (ref 0.0–5.0)
HCT: 48.1 % (ref 39.0–52.0)
Hemoglobin: 15.7 g/dL (ref 13.0–17.0)
Lymphocytes Relative: 35.3 % (ref 12.0–46.0)
Lymphs Abs: 1.8 10*3/uL (ref 0.7–4.0)
MCHC: 32.7 g/dL (ref 30.0–36.0)
MCV: 71.1 fl — ABNORMAL LOW (ref 78.0–100.0)
Monocytes Absolute: 0.7 10*3/uL (ref 0.1–1.0)
Monocytes Relative: 13.1 % — ABNORMAL HIGH (ref 3.0–12.0)
Neutro Abs: 2.3 10*3/uL (ref 1.4–7.7)
Neutrophils Relative %: 44.6 % (ref 43.0–77.0)
Platelets: 185 10*3/uL (ref 150.0–400.0)
RBC: 6.76 Mil/uL — ABNORMAL HIGH (ref 4.22–5.81)
RDW: 22.2 % — ABNORMAL HIGH (ref 11.5–15.5)
WBC: 5.1 10*3/uL (ref 4.0–10.5)

## 2021-02-15 LAB — COMPREHENSIVE METABOLIC PANEL
ALT: 9 U/L (ref 0–53)
AST: 10 U/L (ref 0–37)
Albumin: 3.9 g/dL (ref 3.5–5.2)
Alkaline Phosphatase: 105 U/L (ref 39–117)
BUN: 5 mg/dL — ABNORMAL LOW (ref 6–23)
CO2: 30 mEq/L (ref 19–32)
Calcium: 9.9 mg/dL (ref 8.4–10.5)
Chloride: 101 mEq/L (ref 96–112)
Creatinine, Ser: 1.06 mg/dL (ref 0.40–1.50)
GFR: 69.74 mL/min (ref >60.00)
Glucose, Bld: 89 mg/dL (ref 70–99)
Potassium: 4.4 mEq/L (ref 3.5–5.1)
Sodium: 138 mEq/L (ref 135–145)
Total Bilirubin: 0.6 mg/dL (ref 0.2–1.2)
Total Protein: 6.1 g/dL (ref 6.0–8.3)

## 2021-02-15 LAB — MICROALBUMIN / CREATININE URINE RATIO
Creatinine,U: 111.5 mg/dL
Microalb Creat Ratio: 0.6 mg/g (ref 0.0–30.0)
Microalb, Ur: <0.7 mg/dL (ref 0.0–1.9)

## 2021-02-15 LAB — PSA: PSA: 0.9 ng/mL (ref 0.10–4.00)

## 2021-02-15 NOTE — Patient Instructions (Signed)
Dominic Turner , Thank you for taking time to come for your Medicare Wellness Visit. I appreciate your ongoing commitment to your health goals. Please review the following plan we discussed and let me know if I can assist you in the future.   Screening recommendations/referrals: Colonoscopy: Up to date, completed 09/12/2019, due 09/2022 Recommended yearly ophthalmology/optometry visit for glaucoma screening and checkup Recommended yearly dental visit for hygiene and checkup  Vaccinations: Influenza vaccine: Up to date, completed 09/23/2020, due 07/2021 Pneumococcal vaccine: Completed series Tdap vaccine: decline-insurance  Shingles vaccine: due, check with your insurance regarding coverage if interested    Covid-19: Completed series  Advanced directives: Please bring a copy of your POA (Power of Attorney) and/or Living Will to your next appointment.   Conditions/risks identified: hypertension, hypercholesterolemia   Next appointment: Follow up in one year for your annual wellness visit.   Preventive Care 35 Years and Older, Male Preventive care refers to lifestyle choices and visits with your health care provider that can promote health and wellness. What does preventive care include?  A yearly physical exam. This is also called an annual well check.  Dental exams once or twice a year.  Routine eye exams. Ask your health care provider how often you should have your eyes checked.  Personal lifestyle choices, including:  Daily care of your teeth and gums.  Regular physical activity.  Eating a healthy diet.  Avoiding tobacco and drug use.  Limiting alcohol use.  Practicing safe sex.  Taking low doses of aspirin every day.  Taking vitamin and mineral supplements as recommended by your health care provider. What happens during an annual well check? The services and screenings done by your health care provider during your annual well check will depend on your age, overall  health, lifestyle risk factors, and family history of disease. Counseling  Your health care provider may ask you questions about your:  Alcohol use.  Tobacco use.  Drug use.  Emotional well-being.  Home and relationship well-being.  Sexual activity.  Eating habits.  History of falls.  Memory and ability to understand (cognition).  Work and work Statistician. Screening  You may have the following tests or measurements:  Height, weight, and BMI.  Blood pressure.  Lipid and cholesterol levels. These may be checked every 5 years, or more frequently if you are over 4 years old.  Skin check.  Lung cancer screening. You may have this screening every year starting at age 32 if you have a 30-pack-year history of smoking and currently smoke or have quit within the past 15 years.  Fecal occult blood test (FOBT) of the stool. You may have this test every year starting at age 78.  Flexible sigmoidoscopy or colonoscopy. You may have a sigmoidoscopy every 5 years or a colonoscopy every 10 years starting at age 47.  Prostate cancer screening. Recommendations will vary depending on your family history and other risks.  Hepatitis C blood test.  Hepatitis B blood test.  Sexually transmitted disease (STD) testing.  Diabetes screening. This is done by checking your blood sugar (glucose) after you have not eaten for a while (fasting). You may have this done every 1-3 years.  Abdominal aortic aneurysm (AAA) screening. You may need this if you are a current or former smoker.  Osteoporosis. You may be screened starting at age 89 if you are at high risk. Talk with your health care provider about your test results, treatment options, and if necessary, the need for more tests. Vaccines  Your health care provider may recommend certain vaccines, such as:  Influenza vaccine. This is recommended every year.  Tetanus, diphtheria, and acellular pertussis (Tdap, Td) vaccine. You may need a Td  booster every 10 years.  Zoster vaccine. You may need this after age 61.  Pneumococcal 13-valent conjugate (PCV13) vaccine. One dose is recommended after age 65.  Pneumococcal polysaccharide (PPSV23) vaccine. One dose is recommended after age 71. Talk to your health care provider about which screenings and vaccines you need and how often you need them. This information is not intended to replace advice given to you by your health care provider. Make sure you discuss any questions you have with your health care provider. Document Released: 12/24/2015 Document Revised: 08/16/2016 Document Reviewed: 09/28/2015 Elsevier Interactive Patient Education  2017 [place] Prevention in the Home Falls can cause injuries. They can happen to people of all ages. There are many things you can do to make your home safe and to help prevent falls. What can I do on the outside of my home?  Regularly fix the edges of walkways and driveways and fix any cracks.  Remove anything that might make you trip as you walk through a door, such as a raised step or threshold.  Trim any bushes or trees on the path to your home.  Use bright outdoor lighting.  Clear any walking paths of anything that might make someone trip, such as rocks or tools.  Regularly check to see if handrails are loose or broken. Make sure that both sides of any steps have handrails.  Any raised decks and porches should have guardrails on the edges.  Have any leaves, snow, or ice cleared regularly.  Use sand or salt on walking paths during winter.  Clean up any spills in your garage right away. This includes oil or grease spills. What can I do in the bathroom?  Use night lights.  Install grab bars by the toilet and in the tub and shower. Do not use towel bars as grab bars.  Use non-skid mats or decals in the tub or shower.  If you need to sit down in the shower, use a plastic, non-slip stool.  Keep the floor dry. Clean up  any water that spills on the floor as soon as it happens.  Remove soap buildup in the tub or shower regularly.  Attach bath mats securely with double-sided non-slip rug tape.  Do not have throw rugs and other things on the floor that can make you trip. What can I do in the bedroom?  Use night lights.  Make sure that you have a light by your bed that is easy to reach.  Do not use any sheets or blankets that are too big for your bed. They should not hang down onto the floor.  Have a firm chair that has side arms. You can use this for support while you get dressed.  Do not have throw rugs and other things on the floor that can make you trip. What can I do in the kitchen?  Clean up any spills right away.  Avoid walking on wet floors.  Keep items that you use a lot in easy-to-reach places.  If you need to reach something above you, use a strong step stool that has a grab bar.  Keep electrical cords out of the way.  Do not use floor polish or wax that makes floors slippery. If you must use wax, use non-skid floor wax.  Do  not have throw rugs and other things on the floor that can make you trip. What can I do with my stairs?  Do not leave any items on the stairs.  Make sure that there are handrails on both sides of the stairs and use them. Fix handrails that are broken or loose. Make sure that handrails are as long as the stairways.  Check any carpeting to make sure that it is firmly attached to the stairs. Fix any carpet that is loose or worn.  Avoid having throw rugs at the top or bottom of the stairs. If you do have throw rugs, attach them to the floor with carpet tape.  Make sure that you have a light switch at the top of the stairs and the bottom of the stairs. If you do not have them, ask someone to add them for you. What else can I do to help prevent falls?  Wear shoes that:  Do not have high heels.  Have rubber bottoms.  Are comfortable and fit you well.  Are  closed at the toe. Do not wear sandals.  If you use a stepladder:  Make sure that it is fully opened. Do not climb a closed stepladder.  Make sure that both sides of the stepladder are locked into place.  Ask someone to hold it for you, if possible.  Clearly mark and make sure that you can see:  Any grab bars or handrails.  First and last steps.  Where the edge of each step is.  Use tools that help you move around (mobility aids) if they are needed. These include:  Canes.  Walkers.  Scooters.  Crutches.  Turn on the lights when you go into a dark area. Replace any light bulbs as soon as they burn out.  Set up your furniture so you have a clear path. Avoid moving your furniture around.  If any of your floors are uneven, fix them.  If there are any pets around you, be aware of where they are.  Review your medicines with your doctor. Some medicines can make you feel dizzy. This can increase your chance of falling. Ask your doctor what other things that you can do to help prevent falls. This information is not intended to replace advice given to you by your health care provider. Make sure you discuss any questions you have with your health care provider. Document Released: 09/23/2009 Document Revised: 05/04/2016 Document Reviewed: 01/01/2015 Elsevier Interactive Patient Education  2017 [employer].

## 2021-02-15 NOTE — Progress Notes (Addendum)
Subjective:   Dominic Turner is a 75 y.o. male who presents for Medicare Annual/Subsequent preventive examination.  Review of Systems: N/A      I connected with the patient today by telephone and verified that I am speaking with the correct person using two identifiers. Location patient: home Location nurse: work Persons participating in the telephone visit: patient, nurse.   I discussed the limitations, risks, security and privacy concerns of performing an evaluation and management service by telephone and the availability of in person appointments. I also discussed with the patient that there may be a patient responsible charge related to this service. The patient expressed understanding and verbally consented to this telephonic visit.        Cardiac Risk Factors include: advanced age (>41men, >30 women);hypertension;Other (see comment), Risk factor comments: hypercholesterolemia     Objective:    Today's Vitals   There is no height or weight on file to calculate BMI.  Advanced Directives 02/15/2021 02/11/2020 02/04/2019 01/24/2018 12/30/2015  Does Patient Have a Medical Advance Directive? Yes Yes Yes Yes Yes  Type of Paramedic of [place];Living will [place];Living will [place];Living will [place];Living will [place];Living will  Does patient want to make changes to medical advance directive? - - - - No - Patient declined  Copy of [place] in Chart? No - copy requested No - copy requested No - copy requested No - copy requested -    Current Medications (verified) Outpatient Encounter Medications as of 02/15/2021  Medication Sig  . acetaminophen (TYLENOL) 500 MG tablet Take 500 mg by mouth every 6 (six) hours as needed.  Marland Kitchen albuterol (PROVENTIL HFA;VENTOLIN HFA) 108 (90 Base) MCG/ACT inhaler Take 2 puffs in AM & PM every day for next 5 days, & also can use  every 4-6 hours as needed. After 5 days, use only every 4-6 hrs as needed  . amLODipine (NORVASC) 10 MG tablet Take 1 tablet (10 mg total) by mouth daily.  Marland Kitchen aspirin 81 MG tablet Take 81 mg by mouth daily.  Marland Kitchen azelastine (ASTELIN) 0.1 % nasal spray Place 1 spray into both nostrils 2 (two) times daily. Use in each nostril as directed  . clobetasol (TEMOVATE) 0.05 % external solution Apply topically.  . cyclobenzaprine (FLEXERIL) 5 MG tablet Take 1 tablet (5 mg total) by mouth at bedtime as needed for muscle spasms.  Mariane Baumgarten Calcium (STOOL SOFTENER PO) Take 1 tablet by mouth daily.  . fluticasone (CUTIVATE) 0.05 % cream Apply topically. Apply to affected areas on face up to twice a day as needed  . fluticasone (FLONASE) 50 MCG/ACT nasal spray USE 2 SPRAYS IN BOTH  NOSTRILS DAILY  . guaiFENesin-codeine (ROBITUSSIN AC) 100-10 MG/5ML syrup Take 5 mLs by mouth 3 (three) times daily as needed for cough or congestion (sedation precautions).  Marland Kitchen ibuprofen (ADVIL,MOTRIN) 600 MG tablet Take 1 tablet (600 mg total) by mouth 3 (three) times daily as needed. (Patient taking differently: Take 600 mg by mouth daily as needed.)  . loratadine (CLARITIN) 10 MG tablet Take 1 tablet (10 mg total) by mouth daily.  . Multiple Vitamin Essential TABS Take 1 tablet by mouth daily.  . polyethylene glycol powder (GLYCOLAX/MIRALAX) 17 GM/SCOOP powder Take 17 g by mouth daily.  . pravastatin (PRAVACHOL) 40 MG tablet Take 1 tablet (40 mg total) by mouth daily.  . tamsulosin (FLOMAX) 0.4 MG CAPS capsule Take 1 capsule (0.4  mg total) by mouth daily.  Marland Kitchen tiotropium (SPIRIVA HANDIHALER) 18 MCG inhalation capsule INHALE THE CONTENTS OF 1  CAPSULE BY MOUTH VIA  HANDIHALER DAILY AT BEDTIME   Facility-Administered Encounter Medications as of 02/15/2021  Medication  . albuterol (PROVENTIL) (2.5 MG/3ML) 0.083% nebulizer solution 2.5 mg  . betamethasone acetate-betamethasone sodium phosphate (CELESTONE) injection 3 mg    Allergies  (verified) Aspirin, Sertraline hcl, Sulfonamide derivatives, and Wellbutrin [bupropion]   History: Past Medical History:  Diagnosis Date  . Acute cholecystitis 12/2015  . ANXIETY DEPRESSION 10/23/2008  . 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 Bilateral high frequency sensorineural hearing loss    rec hearing aides by ENT  . 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 Post bronchodilator test not improved.  . Diverticulosis 02/2013   by colonoscopy  . Essential hypertension, benign   . History of adenomatous polyp of colon 02/2013   rec rpt 3 yrs  . Overweight(278.02)   . Positive PPD, treated 2014   s/p rifampin x51mo  . Pure hypercholesterolemia   . Tobacco use disorder   . Unspecified gastritis and gastroduodenitis without mention of hemorrhage   . Unspecified sleep apnea    Past Surgical History:  Procedure Laterality Date  . CATARACT EXTRACTION, BILATERAL Bilateral   . CHOLECYSTECTOMY N/A 12/30/2015   Procedure: LAPAROSCOPIC CHOLECYSTECTOMY;  Surgeon: Florene Glen, MD;  Location: [facility] ORS;  Service: General;  Laterality: N/A;  . COLONOSCOPY  03/03/2013   tubular adenoma x3, diverticulosis, rpt 3 yrs (Dr. Cristina Gong)  . COLONOSCOPY  05/2016   TA x3, diverticulosis, single angiodysplastic lesion, rpt 3 yrs (Buccini)  . COLONOSCOPY  09/2019   TA, HP, rpt 5 yrs (Buccini)   Family History  Problem Relation Age of Onset  . Cirrhosis Father        died in 15's  . Hypertension Mother   . Heart attack Sister   . Stroke Sister   . Heart attack Maternal Grandmother   . Stroke Maternal Grandmother   . Thyroid disease Sister   . Diabetes Sister   . Cancer Neg Hx    Social History   Socioeconomic History  . Marital status: Married    Spouse name: Not on file  . Number of children: 2  . Years of education: Not on file  . Highest education level: Not on file  Occupational History  .  Occupation: truck Geophysicist/field seismologist  Tobacco Use  . Smoking status: Current Every Day Smoker    Packs/day: 0.50    Years: 48.00    Pack years: 24.00    Types: Cigarettes    Start date: 12/12/1983  . Smokeless tobacco: Never Used  Vaping Use  . Vaping Use: Never used  Substance and Sexual Activity  . Alcohol use: No    Alcohol/week: 0.0 standard drinks  . Drug use: No  . Sexual activity: Never  Other Topics Concern  . Not on file  Social History Narrative   Lives alone. Wife lives at [facility] in [place] in [place] (memory unit). Sees wife daily.    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 Difficulty of Paying Living Expenses: Not hard at all  Food Insecurity: No Food Insecurity  .  Worried About Charity fundraiser in the Last Year: Never true  . Ran Out of Food in the Last Year: Never true  Transportation Needs: No Transportation Needs  . Lack of Transportation (Medical): No  . Lack of Transportation (Non-Medical): No  Physical Activity: Inactive  . Days of Exercise per Week: 0 days  . Minutes of Exercise per Session: 0 min  Stress: No Stress Concern Present  . Feeling of Stress : Not at all  Social Connections: Not on file    Tobacco Counseling Ready to quit: Not Answered Counseling given: Not Answered   Clinical Intake:  Pre-visit preparation completed: Yes  Pain : No/denies pain     Nutritional Risks: None Diabetes: No  How often do you need to have someone help you when you read instructions, pamphlets, or other written materials from your doctor or pharmacy?: 1 - Never What is the last grade level you completed in school?: 12th  Diabetic: No Nutrition Risk Assessment:  Has the patient had any N/V/D within the last 2 months?  No  Does the patient have any non-healing wounds?  No  Has  the patient had any unintentional weight loss or weight gain?  No   Diabetes:  Is the patient diabetic?  No  If diabetic, was a CBG obtained today?  N/A Did the patient bring in their glucometer from home?  N/A How often do you monitor your CBG's? N/A.   Financial Strains and Diabetes Management:  Are you having any financial strains with the device, your supplies or your medication? N/A.  Does the patient want to be seen by Chronic Care Management for management of their diabetes?  N/A Would the patient like to be referred to a Nutritionist or for Diabetic Management?  N/A   Interpreter Needed?: No  Information entered by :: CJohnson, LPN   Activities of Daily Living In your present state of health, do you have any difficulty performing the following activities: 02/15/2021  Hearing? Y  Comment wears hearing aids  Vision? N  Difficulty concentrating or making decisions? Y  Comment sometimes he forgets things  Walking or climbing stairs? N  Dressing or bathing? N  Doing errands, shopping? N  Preparing Food and eating ? N  Using the Toilet? N  In the past six months, have you accidently leaked urine? Y  Comment does not wear protection  Do you have problems with loss of bowel control? N  Managing your Medications? N  Managing your Finances? N  Housekeeping or managing your Housekeeping? N  Some recent data might be hidden    Patient Care Team: Ria Bush, MD as PCP - General  Indicate any recent Medical Services you may have received from other than [facility] providers in the past year (date may be approximate).     Assessment:   This is a routine wellness examination for [place].  Hearing/Vision screen  Hearing Screening   125Hz  250Hz  500Hz  1000Hz  2000Hz  3000Hz  4000Hz  6000Hz  8000Hz   Right ear:           Left ear:           Vision Screening Comments: Patient gets annual eye exams   Dietary issues and exercise activities discussed: Current Exercise Habits: The  patient does not participate in regular exercise at present, Exercise limited by: None identified  Goals    . Follow up with Primary Care Provider     Starting 02/04/19, I will continue to take medications as prescribed and to  keep appointments with PCP as scheduled.     . Patient Stated     02/11/2020, I will maintain and continue medications as prescribed.     . Patient Stated     02/15/2021, I will maintain and continue medications as prescribed.       Depression Screen PHQ 2/9 Scores 02/15/2021 02/11/2020 02/04/2019 01/24/2018 01/19/2017 01/18/2016 01/15/2015  PHQ - 2 Score 0 0 0 0 0 0 0  PHQ- 9 Score 0 0 0 0 - - -    Fall Risk Fall Risk  02/15/2021 02/11/2020 02/04/2019 01/24/2018 01/19/2017  Falls in the past year? 0 0 0 No No  Number falls in past yr: 0 0 - - -  Injury with Fall? 0 0 - - -  Risk for fall due to : Medication side effect Medication side effect - - -  Follow up Falls evaluation completed;Falls prevention discussed Falls evaluation completed;Falls prevention discussed - - -    FALL RISK PREVENTION PERTAINING TO THE HOME:  Any stairs in or around the home? Yes  If so, are there any without handrails? No  Home free of loose throw rugs in walkways, pet beds, electrical cords, etc? Yes  Adequate lighting in your home to reduce risk of falls? Yes   ASSISTIVE DEVICES UTILIZED TO PREVENT FALLS:  Life alert? No  Use of a cane, walker or w/c? No  Grab bars in the bathroom? No  Shower chair or bench in shower? No  Elevated toilet seat or a handicapped toilet? No   TIMED UP AND GO:  Was the test performed? N/A telephone visit .    Cognitive Function: MMSE - Mini Mental State Exam 02/15/2021 02/11/2020 02/04/2019 01/24/2018  Not completed: Refused - - -  Orientation to time - 5 5 5   Orientation to Place - 5 5 5   Registration - 3 3 3   Attention/ Calculation - 5 0 0  Recall - 3 3 2   Recall-comments - - - unable to recall 1 of 3 words  Language- name 2 objects - - 0 0  Language-  repeat - 1 1 1   Language- follow 3 step command - - 3 1  Language- follow 3 step command-comments - - - unable to follow 2 steps of 3 step command  Language- read & follow direction - - 0 0  Write a sentence - - 0 0  Copy design - - 0 0  Total score - - 20 17  Mini Cog  Mini-Cog screen was not completed. Patient did not have time. Maximum score is 22. A value of 0 denotes this part of the MMSE was not completed or the patient failed this part of the Mini-Cog screening.       Immunizations Immunization History  Administered Date(s) Administered  . Fluad Quad(high Dose 65+) 08/27/2019, 09/23/2020  . Influenza Split 11/15/2012  . Influenza Whole 12/06/1999, 10/11/2013  . Influenza, High Dose Seasonal PF 09/12/2015, 08/11/2018  . Influenza,inj,Quad PF,6+ Mos 10/02/2014, 09/14/2017  . Influenza-Unspecified 09/18/2016  . Moderna SARS-COV2 Booster Vaccination 10/01/2020  . Moderna Sars-Covid-2 Vaccination 02/06/2020, 02/14/2020  . PPD Test 02/07/2013  . Pneumococcal Conjugate-13 01/15/2015  . Pneumococcal Polysaccharide-23 12/05/2002, 06/26/2008, 01/09/2014  . Td 03/11/1998, 06/26/2008  . Zoster 10/11/2013    TDAP status: Due, Education has been provided regarding the importance of this vaccine. Advised may receive this vaccine at local pharmacy or Health Dept. Aware to provide a copy of the vaccination record if obtained from local pharmacy or Health  Dept. Verbalized acceptance and understanding.  Flu Vaccine status: Up to date  Pneumococcal vaccine status: Up to date  Covid-19 vaccine status: Completed vaccines  Qualifies for Shingles Vaccine? Yes   Zostavax completed Yes   Shingrix Completed?: No.    Education has been provided regarding the importance of this vaccine. Patient has been advised to call insurance company to determine out of pocket expense if they have not yet received this vaccine. Advised may also receive vaccine at local pharmacy or Health Dept. Verbalized  acceptance and understanding.  Screening Tests Health Maintenance  Topic Date Due  . TETANUS/TDAP  02/16/2024 (Originally 06/26/2018)  . COVID-19 Vaccine (4 - Booster) 04/01/2021  . COLONOSCOPY (Pts 45-53yrs Insurance coverage will need to be confirmed)  09/11/2022  . INFLUENZA VACCINE  Completed  . Hepatitis C Screening  Completed  . PNA vac Low Risk Adult  Completed  . HPV VACCINES  Aged Out    Health Maintenance  There are no preventive care reminders to display for this patient.  Colorectal cancer screening: Type of screening: Colonoscopy. Completed 09/12/2019. Repeat every 3 years  Lung Cancer Screening: (Low Dose CT Chest recommended if Age 87-80 years, 30 pack-year currently smoking OR have quit w/in 15 years.) does not qualify. Patient is 24 pack year.   Additional Screening:  Hepatitis C Screening: does qualify; Completed 01/14/2016  Vision Screening: Recommended annual ophthalmology exams for early detection of glaucoma and other disorders of the eye. Is the patient up to date with their annual eye exam?  Yes  Who is the provider or what is the name of the office in which the patient attends annual eye exams? Can't remember the name right now  If pt is not established with a provider, would they like to be referred to a provider to establish care? No .   Dental Screening: Recommended annual dental exams for proper oral hygiene  Community Resource Referral / Chronic Care Management: CRR required this visit?  No   CCM required this visit?  No      Plan:     I have personally reviewed and noted the following in the patient's chart:   . Medical and social history . Use of alcohol, tobacco or illicit drugs  . Current medications and supplements . Functional ability and status . Nutritional status . Physical activity . Advanced directives . List of other physicians . Hospitalizations, surgeries, and ER visits in previous 12 months . Vitals . Screenings to  include cognitive, depression, and falls . Referrals and appointments  In addition, I have reviewed and discussed with patient certain preventive protocols, quality metrics, and best practice recommendations. A written personalized care plan for preventive services as well as general preventive health recommendations were provided to patient.   Due to this being a telephonic visit, the after visit summary with patients personalized plan was offered to patient via office or my-chart. Patient preferred to pick up at office at next visit or via mychart.   Andrez Grime, LPN   05/14/3328

## 2021-02-15 NOTE — Progress Notes (Signed)
PCP notes:  Health Maintenance: TDap- insurance    Abnormal Screenings: none   Patient concerns: Red spots on face Discuss medication for urinary frequency   Nurse concerns: none   Next PCP appt.: 02/18/2021 @ 8:30 am

## 2021-02-18 ENCOUNTER — Ambulatory Visit (INDEPENDENT_AMBULATORY_CARE_PROVIDER_SITE_OTHER): Payer: Medicare HMO | Admitting: Family Medicine

## 2021-02-18 ENCOUNTER — Other Ambulatory Visit: Payer: Self-pay

## 2021-02-18 ENCOUNTER — Encounter: Payer: Self-pay | Admitting: Family Medicine

## 2021-02-18 VITALS — BP 136/70 | HR 86 | Temp 98.0°F | Ht 68.5 in | Wt 237.2 lb

## 2021-02-18 DIAGNOSIS — I10 Essential (primary) hypertension: Secondary | ICD-10-CM | POA: Diagnosis not present

## 2021-02-18 DIAGNOSIS — D563 Thalassemia minor: Secondary | ICD-10-CM

## 2021-02-18 DIAGNOSIS — H9193 Unspecified hearing loss, bilateral: Secondary | ICD-10-CM

## 2021-02-18 DIAGNOSIS — K5909 Other constipation: Secondary | ICD-10-CM

## 2021-02-18 DIAGNOSIS — E7800 Pure hypercholesterolemia, unspecified: Secondary | ICD-10-CM

## 2021-02-18 DIAGNOSIS — Z7189 Other specified counseling: Secondary | ICD-10-CM

## 2021-02-18 DIAGNOSIS — Z0000 Encounter for general adult medical examination without abnormal findings: Secondary | ICD-10-CM | POA: Diagnosis not present

## 2021-02-18 DIAGNOSIS — L930 Discoid lupus erythematosus: Secondary | ICD-10-CM

## 2021-02-18 DIAGNOSIS — J449 Chronic obstructive pulmonary disease, unspecified: Secondary | ICD-10-CM

## 2021-02-18 DIAGNOSIS — F17200 Nicotine dependence, unspecified, uncomplicated: Secondary | ICD-10-CM

## 2021-02-18 DIAGNOSIS — Z66 Do not resuscitate: Secondary | ICD-10-CM

## 2021-02-18 MED ORDER — AMLODIPINE BESYLATE 10 MG PO TABS: 10.0000 mg | ORAL_TABLET | Freq: Every day | ORAL | 3 refills | Status: AC

## 2021-02-18 MED ORDER — TAMSULOSIN HCL 0.4 MG PO CAPS: 0.4000 mg | ORAL_CAPSULE | Freq: Every day | ORAL | 3 refills | Status: AC

## 2021-02-18 MED ORDER — AZELASTINE HCL 0.1 % NA SOLN: 1.0000 | Freq: Two times a day (BID) | NASAL | 11 refills | Status: DC

## 2021-02-18 MED ORDER — SPIRIVA HANDIHALER 18 MCG IN CAPS: ORAL_CAPSULE | RESPIRATORY_TRACT | 3 refills | Status: AC

## 2021-02-18 MED ORDER — PRAVASTATIN SODIUM 40 MG PO TABS: 40.0000 mg | ORAL_TABLET | Freq: Every day | ORAL | 3 refills | Status: AC

## 2021-02-18 NOTE — Assessment & Plan Note (Signed)
Continue to encourage cessation. Precontemplative. Discussed lung cancer screening - would be interested so will refer again.

## 2021-02-18 NOTE — Assessment & Plan Note (Signed)
Managed with miralax and prune juice.

## 2021-02-18 NOTE — Patient Instructions (Addendum)
We will refer you for lung cancer screening program Continue thinking about quitting smoking.  If interested, check with pharmacy about new 2 shot shingles series (shingrix).  Bring [place] copy of your living will. DNR form provided today.  You are doing well today. Return as needed or in 1 year for next physical/wellness visit   Health Maintenance After Age 74 After age 19, you are at a higher risk for certain long-term diseases and infections as well as injuries from falls. Falls are a major cause of broken bones and head injuries in people who are older than age 97. Getting regular preventive care can help to keep you healthy and well. Preventive care includes getting regular testing and making lifestyle changes as recommended by your health care provider. Talk with your health care provider about:  Which screenings and tests you should have. A screening is a test that checks for a disease when you have no symptoms.  A diet and exercise plan that is right for you. What should I know about screenings and tests to prevent falls? Screening and testing are the best ways to find a health problem early. Early diagnosis and treatment give you the best chance of managing medical conditions that are common after age 11. Certain conditions and lifestyle choices may make you more likely to have a fall. Your health care provider may recommend:  Regular vision checks. Poor vision and conditions such as cataracts can make you more likely to have a fall. If you wear glasses, make sure to get your prescription updated if your vision changes.  Medicine review. Work with your health care provider to regularly review all of the medicines you are taking, including over-the-counter medicines. Ask your health care provider about any side effects that may make you more likely to have a fall. Tell your health care provider if any medicines that you take make you feel dizzy or sleepy.  Osteoporosis screening. Osteoporosis  is a condition that causes the bones to get weaker. This can make the bones weak and cause them to break more easily.  Blood pressure screening. Blood pressure changes and medicines to control blood pressure can make you feel dizzy.  Strength and balance checks. Your health care provider may recommend certain tests to check your strength and balance while standing, walking, or changing positions.  Foot health exam. Foot pain and numbness, as well as not wearing proper footwear, can make you more likely to have a fall.  Depression screening. You may be more likely to have a fall if you have a fear of falling, feel emotionally low, or feel unable to do activities that you used to do.  Alcohol use screening. Using too much alcohol can affect your balance and may make you more likely to have a fall. What actions can I take to lower my risk of falls? General instructions  Talk with your health care provider about your risks for falling. Tell your health care provider if: ? You fall. Be sure to tell your health care provider about all falls, even ones that seem minor. ? You feel dizzy, sleepy, or off-balance.  Take over-the-counter and prescription medicines only as told by your health care provider. These include any supplements.  Eat a healthy diet and maintain a healthy weight. A healthy diet includes low-fat dairy products, low-fat (lean) meats, and fiber from whole grains, beans, and lots of fruits and vegetables. Home safety  Remove any tripping hazards, such as rugs, cords, and clutter.  Install safety equipment such as grab bars in bathrooms and safety rails on stairs.  Keep rooms and walkways well-lit. Activity  Follow a regular exercise program to stay fit. This will help you maintain your balance. Ask your health care provider what types of exercise are appropriate for you.  If you need a cane or walker, use it as recommended by your health care provider.  Wear supportive shoes  that have nonskid soles.   Lifestyle  Do not drink alcohol if your health care provider tells you not to drink.  If you drink alcohol, limit how much you have: ? 0-1 drink a day for women. ? 0-2 drinks a day for men.  Be aware of how much alcohol is in your drink. In the [place] one drink equals one typical bottle of beer (12 oz), one-half glass of wine (5 oz), or one shot of hard liquor (1 oz).  Do not use any products that contain nicotine or tobacco, such as cigarettes and e-cigarettes. If you need help quitting, ask your health care provider. Summary  Having a healthy lifestyle and getting preventive care can help to protect your health and wellness after age 55.  Screening and testing are the best way to find a health problem early and help you avoid having a fall. Early diagnosis and treatment give you the best chance for managing medical conditions that are more common for people who are older than age 37.  Falls are a major cause of broken bones and head injuries in people who are older than age 70. Take precautions to prevent a fall at home.  Work with your health care provider to learn what changes you can make to improve your health and wellness and to prevent falls. This information is not intended to replace advice given to you by your health care provider. Make sure you discuss any questions you have with your health care provider. Document Revised: 03/20/2019 Document Reviewed: 10/10/2017 Elsevier Patient Education  2021 [employer].

## 2021-02-18 NOTE — Assessment & Plan Note (Signed)
Continued smoker. Stable period on nightly. spiriva

## 2021-02-18 NOTE — Assessment & Plan Note (Signed)
Preventative protocols reviewed and updated unless pt declined. Discussed healthy diet and lifestyle.  

## 2021-02-18 NOTE — Assessment & Plan Note (Signed)
Chronic, adequate control. Continue atorvastatin. The 10-year ASCVD risk score Mikey Bussing DC Brooke Bonito., et al., 2013) is: 37.4%   Values used to calculate the score:     Age: 75 years     Sex: Male     Is Non-Hispanic African American: Yes     Diabetic: No     Tobacco smoker: Yes     Systolic Blood Pressure: 894 mmHg     Is BP treated: Yes     HDL Cholesterol: 39.8 mg/dL     Total Cholesterol: 175 mg/dL

## 2021-02-18 NOTE — Assessment & Plan Note (Addendum)
Presumed diagnosis. CBC stable.

## 2021-02-18 NOTE — Assessment & Plan Note (Signed)
Advanced directives: has living will at home for himself and wife. HCPOA - son. Doesn't want prolonged life support. Would want sons to make decision for temporary life support.Asked to bring copy.

## 2021-02-18 NOTE — Assessment & Plan Note (Signed)
Weight loss noted. Encouraged healthy diet and lifestyle choices to affect sustainable weight loss.

## 2021-02-18 NOTE — Assessment & Plan Note (Signed)
Chronic, stable. Continue current regimen. 

## 2021-02-18 NOTE — Progress Notes (Signed)
Patient ID: Dominic Turner, male    DOB: [birth year], 75 y.o.   MRN: [id number]  This visit was conducted in person.  BP 136/70   Pulse 86   Temp 98 F (36.7 C) (Temporal)   Ht 5' 8.5" (1.74 m)   Wt 237 lb 3 oz (107.6 kg)   SpO2 98%   BMI 35.54 kg/m    CC: CPE Subjective:   HPI: Dominic Turner is a 75 y.o. male presenting on 02/18/2021 for Annual Exam (Prt 2. )   Saw health advisor this week for medicare wellness visit. Note reviewed.  No exam data present  Flowsheet Row Clinical Support from 02/15/2021 in [place] at [place]  PHQ-2 Total Score 0    New hearing aides  Fall Risk  02/15/2021 02/11/2020 02/04/2019 01/24/2018 01/19/2017  Falls in the past year? 0 0 0 No No  Number falls in past yr: 0 0 - - -  Injury with Fall? 0 0 - - -  Risk for fall due to : Medication side effect Medication side effect - - -  Follow up Falls evaluation completed;Falls prevention discussed Falls evaluation completed;Falls prevention discussed - - -    Wife with dementia lives in [place]. He sees her daily. She is having more trouble swallowing.   Recent dx discopid lupus L cheek - [name]). Not seeing regularly.   COPD - compliant with spiriva. Continued smoker - 1/2 ppd. Has tried nicorette gum. May consider nicoderm patch. Tried chantix - caused rash. Finds smoking helps nerves.   Preventative: COLONOSCOPY 09/2019 - TA, HP, rpt 5 yrs (Buccini) - notes increasing gas since then.  Prostate -DREnormal2018, PSA reassuring. Declines further DRE. Nocturia x3.  Lung cancer screening -referred2018,was not eligible at that time, endorsed 24 PY hx. Now eligible - will refer again.  Flu shot yearly COVID vaccine Moderna 01/2020, 02/2020, 09/2020 Pneumovax - 20009, 2015. Prevnar 2016 Td 2009 zostavax- 10/2013 shingrix - discussed  Advanced directives: has living will at home for himself and wife. HCPOA - son. Doesn't want prolonged life support.  Would want sons to make decision for temporary life support.Asked to bring copy.  Seat belt use discussed Sunscreen use discussed.No changing moles in skin. Smoker - 1/2 ppd  Alcohol - none  Dentist q6 mo. Recent dental work.  Eye exam yearly Bowel - no constipation - some IBS-C managed with miralax.  Bladder - no incontinence.   Lives alone. Wife lives at [facility] in [place] in [place] (memory unit). 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 Interim medical history since our last visit reviewed. Allergies and medications reviewed and updated. Outpatient Medications Prior to Visit  Medication Sig Dispense Refill  . acetaminophen (TYLENOL) 500 MG tablet Take 500 mg by mouth every 6 (six) hours as needed.    Marland Kitchen albuterol (PROVENTIL HFA;VENTOLIN HFA) 108 (90 Base) MCG/ACT inhaler Take 2 puffs in AM & PM every day for next 5 days, & also can use every 4-6 hours as needed. After 5 days, use only every 4-6 hrs as needed 1 Inhaler 0  . aspirin 81 MG tablet Take 81 mg by mouth daily.    . clobetasol (TEMOVATE) 0.05 % external solution Apply topically.    Mariane Baumgarten  Calcium (STOOL SOFTENER PO) Take 1 tablet by mouth daily.    . fluticasone (CUTIVATE) 0.05 % cream Apply topically. Apply to affected areas on face up to twice a day as needed    . fluticasone (FLONASE) 50 MCG/ACT nasal spray USE 2 SPRAYS IN BOTH  NOSTRILS DAILY 48 g 3  . guaiFENesin-codeine (ROBITUSSIN AC) 100-10 MG/5ML syrup Take 5 mLs by mouth 3 (three) times daily as needed for cough or congestion (sedation precautions). 100 mL 0  . ibuprofen (ADVIL,MOTRIN) 600 MG tablet Take 1 tablet (600 mg total) by mouth 3 (three) times daily as needed. (Patient taking differently: Take 600 mg by mouth daily as needed.) 20  tablet 0  . loratadine (CLARITIN) 10 MG tablet Take 1 tablet (10 mg total) by mouth daily.    . Multiple Vitamin Essential TABS Take 1 tablet by mouth daily.    . polyethylene glycol powder (GLYCOLAX/MIRALAX) 17 GM/SCOOP powder Take 17 g by mouth daily. 3350 g 1  . amLODipine (NORVASC) 10 MG tablet Take 1 tablet (10 mg total) by mouth daily. 90 tablet 0  . azelastine (ASTELIN) 0.1 % nasal spray Place 1 spray into both nostrils 2 (two) times daily. Use in each nostril as directed 30 mL 3  . cyclobenzaprine (FLEXERIL) 5 MG tablet Take 1 tablet (5 mg total) by mouth at bedtime as needed for muscle spasms. 6 tablet 0  . pravastatin (PRAVACHOL) 40 MG tablet Take 1 tablet (40 mg total) by mouth daily. 90 tablet 0  . tamsulosin (FLOMAX) 0.4 MG CAPS capsule Take 1 capsule (0.4 mg total) by mouth daily. 90 capsule 0  . tiotropium (SPIRIVA HANDIHALER) 18 MCG inhalation capsule INHALE THE CONTENTS OF 1  CAPSULE BY MOUTH VIA  HANDIHALER DAILY AT BEDTIME 90 capsule 0   Facility-Administered Medications Prior to Visit  Medication Dose Route Frequency Provider Last Rate Last Admin  . albuterol (PROVENTIL) (2.5 MG/3ML) 0.083% nebulizer solution 2.5 mg  2.5 mg Nebulization Once Guse, Jacquelynn Cree, FNP      . betamethasone acetate-betamethasone sodium phosphate (CELESTONE) injection 3 mg  3 mg Intramuscular Once Edrick Kins, DPM         Per HPI unless specifically indicated in ROS section below Review of Systems  Constitutional: Negative for activity change, appetite change, chills, fatigue, fever and unexpected weight change.  HENT: Negative for hearing loss.   Eyes: Negative for visual disturbance.  Respiratory: Negative for cough, chest tightness, shortness of breath and wheezing.   Cardiovascular: Negative for chest pain, palpitations and leg swelling.  Gastrointestinal: Positive for constipation. Negative for abdominal distention, abdominal pain, blood in stool, diarrhea, nausea and vomiting.   Genitourinary: Negative for difficulty urinating and hematuria.  Musculoskeletal: Negative for arthralgias, myalgias and neck pain.  Skin: Negative for rash.  Neurological: Negative for dizziness, seizures, syncope and headaches.  Hematological: Negative for adenopathy. Does not bruise/bleed easily.  Psychiatric/Behavioral: Negative for dysphoric mood. The patient is not nervous/anxious (worries about wife).    Objective:  BP 136/70   Pulse 86   Temp 98 F (36.7 C) (Temporal)   Ht 5' 8.5" (1.74 m)   Wt 237 lb 3 oz (107.6 kg)   SpO2 98%   BMI 35.54 kg/m   Wt Readings from Last 3 Encounters:  02/18/21 237 lb 3 oz (107.6 kg)  12/06/20 243 lb (110.2 kg)  03/30/20 245 lb (111.1 kg)      Physical Exam Vitals and nursing note reviewed.  Constitutional:  General: He is not in acute distress.    Appearance: Normal appearance. He is well-developed. He is obese. He is not ill-appearing.  HENT:     Head: Normocephalic and atraumatic.     Right Ear: Hearing normal.     Left Ear: Hearing normal.  Eyes:     General: No scleral icterus.    Extraocular Movements: Extraocular movements intact.     Conjunctiva/sclera: Conjunctivae normal.     Pupils: Pupils are equal, round, and reactive to light.  Neck:     Thyroid: No thyroid mass or thyromegaly.     Vascular: No carotid bruit.  Cardiovascular:     Rate and Rhythm: Normal rate and regular rhythm.     Pulses: Normal pulses.          Radial pulses are 2+ on the right side and 2+ on the left side.     Heart sounds: Normal heart sounds. No murmur heard.   Pulmonary:     Effort: Pulmonary effort is normal. No respiratory distress.     Breath sounds: Normal breath sounds. No wheezing, rhonchi or rales.  Abdominal:     General: Bowel sounds are normal. There is no distension.     Palpations: Abdomen is soft. There is no mass.     Tenderness: There is no abdominal tenderness. There is no guarding or rebound.     Hernia: No hernia  is present.  Musculoskeletal:        General: Normal range of motion.     Cervical back: Normal range of motion and neck supple.     Right lower leg: No edema.     Left lower leg: No edema.  Lymphadenopathy:     Cervical: No cervical adenopathy.  Skin:    General: Skin is warm and dry.     Findings: No rash.  Neurological:     General: No focal deficit present.     Mental Status: He is alert and oriented to person, place, and time.     Comments: CN grossly intact, station and gait intact  Psychiatric:        Mood and Affect: Mood normal.        Behavior: Behavior normal.        Thought Content: Thought content normal.        Judgment: Judgment normal.       Results for orders placed or performed in visit on 02/15/21  Microalbumin / creatinine urine ratio  Result Value Ref Range   Microalb, Ur <0.7 0.0 - 1.9 mg/dL   Creatinine,U 111.5 mg/dL   Microalb Creat Ratio 0.6 0.0 - 30.0 mg/g  PSA  Result Value Ref Range   PSA 0.90 0.10 - 4.00 ng/mL  Comprehensive metabolic panel  Result Value Ref Range   Sodium 138 135 - 145 mEq/L   Potassium 4.4 3.5 - 5.1 mEq/L   Chloride 101 96 - 112 mEq/L   CO2 30 19 - 32 mEq/L   Glucose, Bld 89 70 - 99 mg/dL   BUN 5 (L) 6 - 23 mg/dL   Creatinine, Ser 1.06 0.40 - 1.50 mg/dL   Total Bilirubin 0.6 0.2 - 1.2 mg/dL   Alkaline Phosphatase 105 39 - 117 U/L   AST 10 0 - 37 U/L   ALT 9 0 - 53 U/L   Total Protein 6.1 6.0 - 8.3 g/dL   Albumin 3.9 3.5 - 5.2 g/dL   GFR 69.74 >60.00 mL/min   Calcium 9.9 8.4 -  10.5 mg/dL  Lipid panel  Result Value Ref Range   Cholesterol 175 0 - 200 mg/dL   Triglycerides 120.0 0.0 - 149.0 mg/dL   HDL 39.80 >39.00 mg/dL   VLDL 24.0 0.0 - 40.0 mg/dL   LDL Cholesterol 111 (H) 0 - 99 mg/dL   Total CHOL/HDL Ratio 4    NonHDL 135.34   CBC with Differential/Platelet  Result Value Ref Range   WBC 5.1 4.0 - 10.5 K/uL   RBC 6.76 (H) 4.22 - 5.81 Mil/uL   Hemoglobin 15.7 13.0 - 17.0 g/dL   HCT 48.1 39.0 - 52.0 %   MCV  71.1 (L) 78.0 - 100.0 fl   MCHC 32.7 30.0 - 36.0 g/dL   RDW 22.2 (H) 11.5 - 15.5 %   Platelets 185.0 150.0 - 400.0 K/uL   Neutrophils Relative % 44.6 43.0 - 77.0 %   Lymphocytes Relative 35.3 12.0 - 46.0 %   Monocytes Relative 13.1 (H) 3.0 - 12.0 %   Eosinophils Relative 5.1 (H) 0.0 - 5.0 %   Basophils Relative 1.9 0.0 - 3.0 %   Neutro Abs 2.3 1.4 - 7.7 K/uL   Lymphs Abs 1.8 0.7 - 4.0 K/uL   Monocytes Absolute 0.7 0.1 - 1.0 K/uL   Eosinophils Absolute 0.3 0.0 - 0.7 K/uL   Basophils Absolute 0.1 0.0 - 0.1 K/uL   Assessment & Plan:  This visit occurred during the SARS-CoV-2 public health emergency.  Safety protocols were in place, including screening questions prior to the visit, additional usage of staff PPE, and extensive cleaning of exam room while observing appropriate contact time as indicated for disinfecting solutions.   Problem List Items Addressed This Visit    HYPERCHOLESTEROLEMIA, PURE    Chronic, adequate control. Continue atorvastatin. The 10-year ASCVD risk score Mikey Bussing DC Brooke Bonito., et al., 2013) is: 37.4%   Values used to calculate the score:     Age: 78 years     Sex: Male     Is Non-Hispanic African American: Yes     Diabetic: No     Tobacco smoker: Yes     Systolic Blood Pressure: 382 mmHg     Is BP treated: Yes     HDL Cholesterol: 39.8 mg/dL     Total Cholesterol: 175 mg/dL       Relevant Medications   amLODipine (NORVASC) 10 MG tablet   pravastatin (PRAVACHOL) 40 MG tablet   Severe obesity (BMI 35.0-39.9) with comorbidity ([place])    Weight loss noted. Encouraged healthy diet and lifestyle choices to affect sustainable weight loss.       TOBACCO ABUSE    Continue to encourage cessation. Precontemplative. Discussed lung cancer screening - would be interested so will refer again.       Relevant Orders   Ambulatory Referral for Lung Cancer Scre   HYPERTENSION, BENIGN ESSENTIAL    Chronic, stable. Continue current regimen.      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 CBC stable.       COPD, severe ([place])    Continued smoker. Stable period on nightly. spiriva       Relevant Medications   tiotropium (SPIRIVA HANDIHALER) 18 MCG inhalation capsule   azelastine (ASTELIN) 0.1 % nasal spray   Advanced care planning/counseling discussion    Advanced directives: has living  will at home for himself and wife. HCPOA - son. Doesn't want prolonged life support. Would want sons to make decision for temporary life support.Asked to bring copy.       Health maintenance examination - Primary    Preventative protocols reviewed and updated unless pt declined. Discussed healthy diet and lifestyle.       Discoid lupus erythematosus   Chronic constipation    Managed with miralax and prune juice.       DNR (do not resuscitate)    Discussed with patient - he confirms he would not want CPR, compression, shocks if heart stops. Would consider temporary life support for reversible conditions.           Meds ordered this encounter  Medications  . tiotropium (SPIRIVA HANDIHALER) 18 MCG inhalation capsule    Sig: INHALE THE CONTENTS OF 1  CAPSULE BY MOUTH VIA  HANDIHALER DAILY AT BEDTIME    Dispense:  90 capsule    Refill:  3  . azelastine (ASTELIN) 0.1 % nasal spray    Sig: Place 1 spray into both nostrils 2 (two) times daily. Use in each nostril as directed    Dispense:  30 mL    Refill:  11  . amLODipine (NORVASC) 10 MG tablet    Sig: Take 1 tablet (10 mg total) by mouth daily.    Dispense:  90 tablet    Refill:  3  . pravastatin (PRAVACHOL) 40 MG tablet    Sig: Take 1 tablet (40 mg total) by mouth daily.    Dispense:  90 tablet    Refill:  3  . tamsulosin (FLOMAX) 0.4 MG CAPS capsule    Sig: Take 1 capsule (0.4 mg total) by mouth daily.    Dispense:  90 capsule    Refill:  3   Orders Placed  This Encounter  Procedures  . Ambulatory Referral for Lung Cancer Scre    Referral Priority:   Routine    Referral Type:   Consultation    Referral Reason:   Specialty Services Required    Number of Visits Requested:   1    Patient instructions: We will refer you for lung cancer screening program Continue thinking about quitting smoking.  If interested, check with pharmacy about new 2 shot shingles series (shingrix).  Bring [place] copy of your living will. DNR form provided today.  You are doing well today. Return as needed or in 1 year for next physical/wellness visit   Follow up plan: Return in about 1 year (around 02/18/2022) for annual exam, prior fasting for blood work, medicare wellness visit.  Ria Bush, MD

## 2021-02-18 NOTE — Assessment & Plan Note (Signed)
Discussed with patient - he confirms he would not want CPR, compression, shocks if heart stops. Would consider temporary life support for reversible conditions.

## 2021-02-18 NOTE — Assessment & Plan Note (Signed)
New hearing aides

## 2021-02-24 ENCOUNTER — Telehealth: Payer: Self-pay | Admitting: *Deleted

## 2021-02-24 ENCOUNTER — Encounter: Payer: Self-pay | Admitting: *Deleted

## 2021-02-24 DIAGNOSIS — Z122 Encounter for screening for malignant neoplasm of respiratory organs: Secondary | ICD-10-CM

## 2021-02-24 DIAGNOSIS — F17200 Nicotine dependence, unspecified, uncomplicated: Secondary | ICD-10-CM

## 2021-02-24 DIAGNOSIS — Z87891 Personal history of nicotine dependence: Secondary | ICD-10-CM

## 2021-02-24 NOTE — Telephone Encounter (Signed)
Received referral for initial lung cancer screening scan. Contacted patient and obtained smoking history,(current, 42 pack year) as well as answering questions related to screening process. Patient denies signs of lung cancer such as weight loss or hemoptysis. Patient denies comorbidity that would prevent curative treatment if lung cancer were found. Patient is scheduled for shared decision making visit and CT scan on 03/08/21 at 2pm.

## 2021-03-08 ENCOUNTER — Ambulatory Visit: Admission: RE | Admit: 2021-03-08 | Payer: Medicare HMO | Source: Ambulatory Visit

## 2021-03-08 ENCOUNTER — Inpatient Hospital Stay: Payer: Medicare HMO | Admitting: Nurse Practitioner

## 2021-03-22 ENCOUNTER — Ambulatory Visit
Admission: RE | Admit: 2021-03-22 | Discharge: 2021-03-22 | Disposition: A | Discharge status: Home / Self Care | Payer: Medicare HMO | Source: Ambulatory Visit | Attending: Nurse Practitioner | Admitting: Nurse Practitioner

## 2021-03-22 ENCOUNTER — Inpatient Hospital Stay: Payer: Medicare HMO | Attending: Nurse Practitioner | Admitting: Oncology

## 2021-03-22 ENCOUNTER — Other Ambulatory Visit: Payer: Self-pay

## 2021-03-22 DIAGNOSIS — Z122 Encounter for screening for malignant neoplasm of respiratory organs: Secondary | ICD-10-CM | POA: Insufficient documentation

## 2021-03-22 DIAGNOSIS — Z87891 Personal history of nicotine dependence: Secondary | ICD-10-CM

## 2021-03-22 DIAGNOSIS — F17200 Nicotine dependence, unspecified, uncomplicated: Secondary | ICD-10-CM | POA: Diagnosis not present

## 2021-03-22 DIAGNOSIS — F17210 Nicotine dependence, cigarettes, uncomplicated: Secondary | ICD-10-CM | POA: Diagnosis not present

## 2021-03-22 NOTE — Progress Notes (Signed)
Virtual Visit via Video Note  I connected with Mr. Maxton on 03/22/21 at  1:45 PM EDT by a video enabled telemedicine application and verified that I am speaking with the correct person using two identifiers.  Location: Patient: [facility] Provider: Clinic    I discussed the limitations of evaluation and management by telemedicine and the availability of in person appointments. The patient expressed understanding and agreed to proceed.  I discussed the assessment and treatment plan with the patient. The patient was provided an opportunity to ask questions and all were answered. The patient agreed with the plan and demonstrated an understanding of the instructions.   The patient was advised to call back or seek an in-person evaluation if the symptoms worsen or if the condition fails to improve as anticipated.   In accordance with CMS guidelines, patient has met eligibility criteria including age, absence of signs or symptoms of lung cancer.  Social History   Tobacco Use  . Smoking status: Current Every Day Smoker    Packs/day: 0.75    Years: 56.00    Pack years: 42.00    Types: Cigarettes  . Smokeless tobacco: Never Used  Vaping Use  . Vaping Use: Never used  Substance Use Topics  . Alcohol use: No    Alcohol/week: 0.0 standard drinks  . Drug use: No      A shared decision-making session was conducted prior to the performance of CT scan. This includes one or more decision aids, includes benefits and harms of screening, follow-up diagnostic testing, over-diagnosis, false positive rate, and total radiation exposure.   Counseling on the importance of adherence to annual lung cancer LDCT screening, impact of co-morbidities, and ability or willingness to undergo diagnosis and treatment is imperative for compliance of the program.   Counseling on the importance of continued smoking cessation for former smokers; the importance of smoking cessation for current smokers, and information about  tobacco cessation interventions have been given to patient including [place] [facility] and 1800 quit [place] programs.   Written order for lung cancer screening with LDCT has been given to the patient and any and all questions have been answered to the best of my abilities.    Yearly follow up will be coordinated by Shawn Perkins, Thoracic Navigator.  I provided 15 minutes of face-to-face video visit time during this encounter, and > 50% was spent counseling as documented under my assessment & plan.    E , NP  

## 2021-03-24 ENCOUNTER — Telehealth: Payer: Self-pay | Admitting: *Deleted

## 2021-03-24 NOTE — Telephone Encounter (Signed)
Notified patient of LDCT lung cancer screening program results with recommendation for 12 month follow up imaging. Also notified of incidental findings noted below and is encouraged to discuss further with PCP who will receive a copy of this note and/or the CT report. Patient verbalizes understanding.   IMPRESSION: 1. Lung-RADS 2S, benign appearance or behavior. Continue annual screening with low-dose chest CT without contrast in 12 months. 2. The "S" modifier above refers to potentially clinically significant non lung cancer related findings. Specifically, there is aortic atherosclerosis, in addition to 3 vessel coronary artery disease. Please note that although the presence of coronary artery calcium documents the presence of coronary artery disease, the severity of this disease and any potential stenosis cannot be assessed on this non-gated CT examination. Assessment for potential risk factor modification, dietary therapy or pharmacologic therapy may be warranted, if clinically indicated. 3. Mild diffuse bronchial wall thickening with very mild centrilobular and paraseptal emphysema; imaging findings suggestive of underlying COPD.  Aortic Atherosclerosis (ICD10-I70.0) and Emphysema (ICD10-J43.9).

## 2021-06-06 DIAGNOSIS — R7303 Prediabetes: Secondary | ICD-10-CM | POA: Insufficient documentation

## 2021-06-06 DIAGNOSIS — R718 Other abnormality of red blood cells: Secondary | ICD-10-CM | POA: Insufficient documentation

## 2021-07-06 DIAGNOSIS — H52223 Regular astigmatism, bilateral: Secondary | ICD-10-CM | POA: Diagnosis not present

## 2021-07-06 DIAGNOSIS — H5213 Myopia, bilateral: Secondary | ICD-10-CM | POA: Diagnosis not present

## 2021-07-06 DIAGNOSIS — H04123 Dry eye syndrome of bilateral lacrimal glands: Secondary | ICD-10-CM | POA: Diagnosis not present

## 2021-07-06 DIAGNOSIS — Z961 Presence of intraocular lens: Secondary | ICD-10-CM | POA: Diagnosis not present

## 2021-07-06 DIAGNOSIS — H1045 Other chronic allergic conjunctivitis: Secondary | ICD-10-CM | POA: Diagnosis not present

## 2021-07-06 DIAGNOSIS — H353131 Nonexudative age-related macular degeneration, bilateral, early dry stage: Secondary | ICD-10-CM | POA: Diagnosis not present

## 2021-07-06 DIAGNOSIS — H524 Presbyopia: Secondary | ICD-10-CM | POA: Diagnosis not present

## 2021-09-01 ENCOUNTER — Other Ambulatory Visit: Payer: Self-pay

## 2021-09-01 ENCOUNTER — Emergency Department
Admission: EM | Admit: 2021-09-01 | Discharge: 2021-09-01 | Disposition: A | Discharge status: Home / Self Care | Payer: Medicare HMO | Attending: Emergency Medicine | Admitting: Emergency Medicine

## 2021-09-01 ENCOUNTER — Emergency Department: Payer: Medicare HMO

## 2021-09-01 DIAGNOSIS — Z7982 Long term (current) use of aspirin: Secondary | ICD-10-CM | POA: Diagnosis not present

## 2021-09-01 DIAGNOSIS — Z20822 Contact with and (suspected) exposure to covid-19: Secondary | ICD-10-CM | POA: Diagnosis not present

## 2021-09-01 DIAGNOSIS — Z79899 Other long term (current) drug therapy: Secondary | ICD-10-CM | POA: Insufficient documentation

## 2021-09-01 DIAGNOSIS — I10 Essential (primary) hypertension: Secondary | ICD-10-CM | POA: Diagnosis not present

## 2021-09-01 DIAGNOSIS — F17210 Nicotine dependence, cigarettes, uncomplicated: Secondary | ICD-10-CM | POA: Insufficient documentation

## 2021-09-01 DIAGNOSIS — K6389 Other specified diseases of intestine: Secondary | ICD-10-CM

## 2021-09-01 DIAGNOSIS — K668 Other specified disorders of peritoneum: Secondary | ICD-10-CM | POA: Insufficient documentation

## 2021-09-01 DIAGNOSIS — Z7951 Long term (current) use of inhaled steroids: Secondary | ICD-10-CM | POA: Diagnosis not present

## 2021-09-01 DIAGNOSIS — R112 Nausea with vomiting, unspecified: Secondary | ICD-10-CM | POA: Diagnosis not present

## 2021-09-01 DIAGNOSIS — R109 Unspecified abdominal pain: Secondary | ICD-10-CM | POA: Diagnosis not present

## 2021-09-01 DIAGNOSIS — J449 Chronic obstructive pulmonary disease, unspecified: Secondary | ICD-10-CM | POA: Diagnosis not present

## 2021-09-01 DIAGNOSIS — R1084 Generalized abdominal pain: Secondary | ICD-10-CM | POA: Diagnosis present

## 2021-09-01 DIAGNOSIS — R1900 Intra-abdominal and pelvic swelling, mass and lump, unspecified site: Secondary | ICD-10-CM | POA: Diagnosis not present

## 2021-09-01 LAB — CBC
HCT: 47.7 % (ref 39.0–52.0)
Hemoglobin: 15.9 g/dL (ref 13.0–17.0)
MCH: 24.5 pg — ABNORMAL LOW (ref 26.0–34.0)
MCHC: 33.3 g/dL (ref 30.0–36.0)
MCV: 73.6 fL — ABNORMAL LOW (ref 80.0–100.0)
Platelets: 184 10*3/uL (ref 150–400)
RBC: 6.48 MIL/uL — ABNORMAL HIGH (ref 4.22–5.81)
RDW: 18.2 % — ABNORMAL HIGH (ref 11.5–15.5)
WBC: 4.7 10*3/uL (ref 4.0–10.5)
nRBC: 0 % (ref 0.0–0.2)

## 2021-09-01 LAB — COMPREHENSIVE METABOLIC PANEL
ALT: 15 U/L (ref 0–44)
AST: 16 U/L (ref 15–41)
Albumin: 3.7 g/dL (ref 3.5–5.0)
Alkaline Phosphatase: 97 U/L (ref 38–126)
Anion gap: 11 (ref 5–15)
BUN: 7 mg/dL — ABNORMAL LOW (ref 8–23)
CO2: 26 mmol/L (ref 22–32)
Calcium: 9.7 mg/dL (ref 8.9–10.3)
Chloride: 100 mmol/L (ref 98–111)
Creatinine, Ser: 0.93 mg/dL (ref 0.61–1.24)
GFR, Estimated: >60 mL/min (ref >60)
Glucose, Bld: 126 mg/dL — ABNORMAL HIGH (ref 70–99)
Potassium: 3.1 mmol/L — ABNORMAL LOW (ref 3.5–5.1)
Sodium: 137 mmol/L (ref 135–145)
Total Bilirubin: 1.1 mg/dL (ref 0.3–1.2)
Total Protein: 6.6 g/dL (ref 6.5–8.1)

## 2021-09-01 LAB — RESP PANEL BY RT-PCR (FLU A&B, COVID) ARPGX2
Influenza A by PCR: NEGATIVE
Influenza B by PCR: NEGATIVE
SARS Coronavirus 2 by RT PCR: NEGATIVE

## 2021-09-01 LAB — TYPE AND SCREEN
ABO/RH(D): A POS
Antibody Screen: NEGATIVE

## 2021-09-01 IMAGING — CT CT ABD-PELV W/ CM
2 of 5 series · 15 of 46 positions shown, 17 images · IV contrast (APPLIED)
Comparison: [DATE] CT abdomen/pelvis.

CLINICAL DATA: Abdominal pain, acute, nonlocalized. Dizziness with
nausea.

EXAM:
CT ABDOMEN AND PELVIS WITH CONTRAST
TECHNIQUE: Multidetector CT imaging of the abdomen and pelvis was performed
using the standard protocol following bolus administration of
intravenous contrast.
CONTRAST:  100mL OMNIPAQUE IOHEXOL 350 MG/ML SOLN

[Series 2: routine abd/pel with · axial · 0.93mm/px · z∈[-211,+269]mm · 12 of 109 slices shown, 14 images]
[im 7/109  soft-tissue]
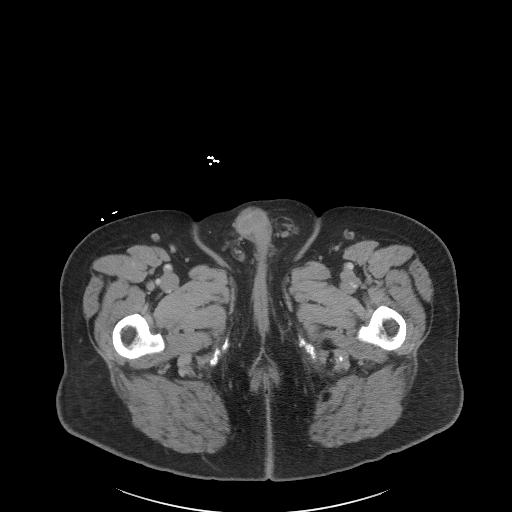
[im 7/109  bone]
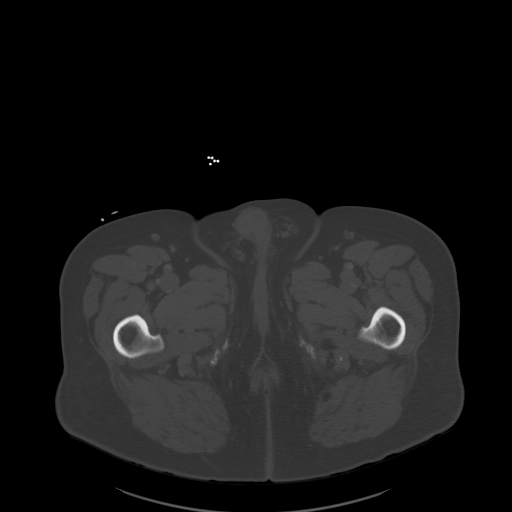
[im 19/109  soft-tissue]
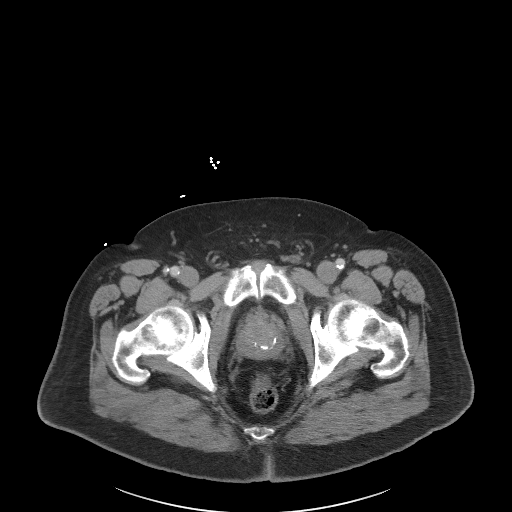
[im 25/109  soft-tissue]
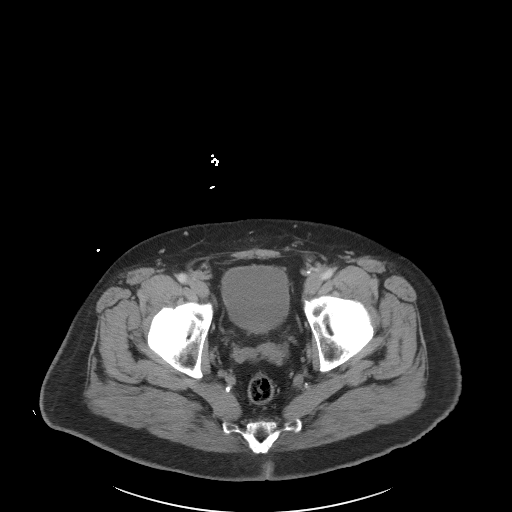
[im 31/109  soft-tissue]
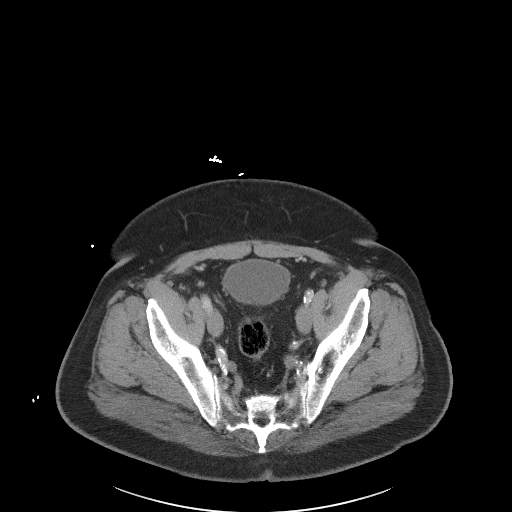
[im 43/109  soft-tissue]
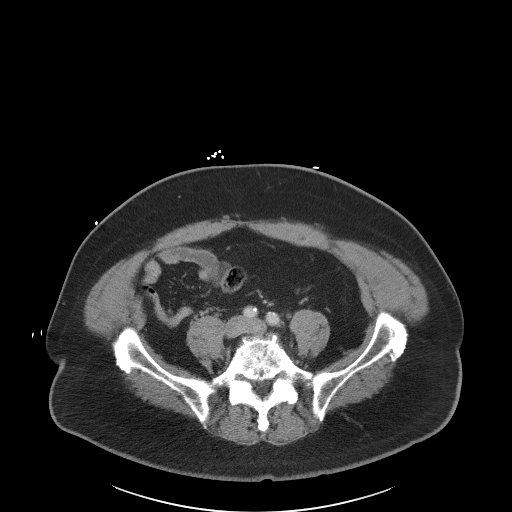
[im 49/109  soft-tissue]
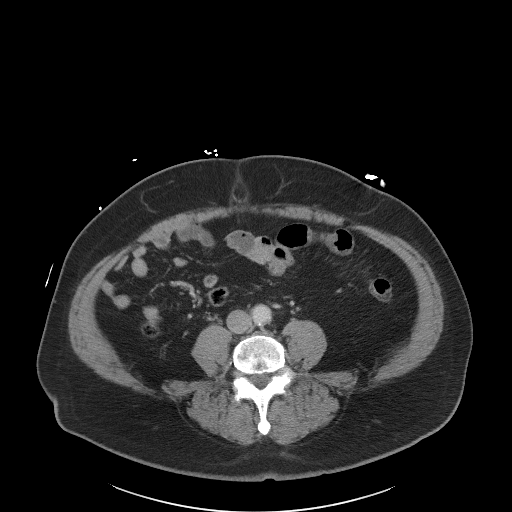
[im 61/109  soft-tissue]
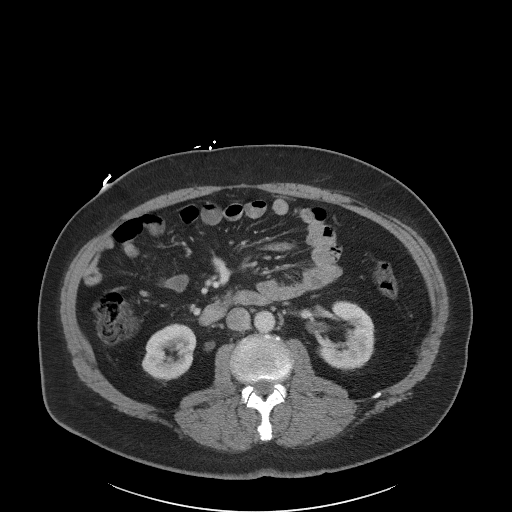
[im 67/109  soft-tissue]
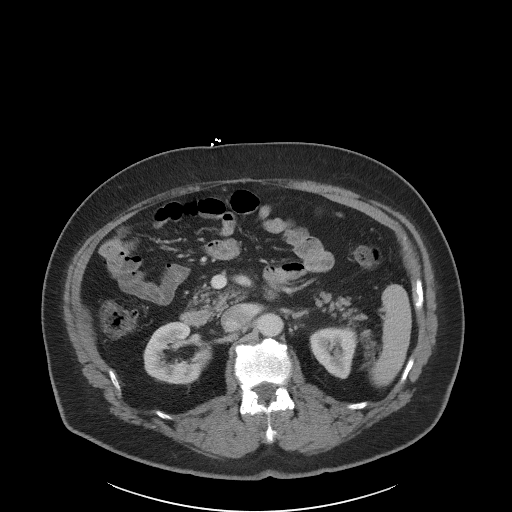
[im 79/109  soft-tissue]
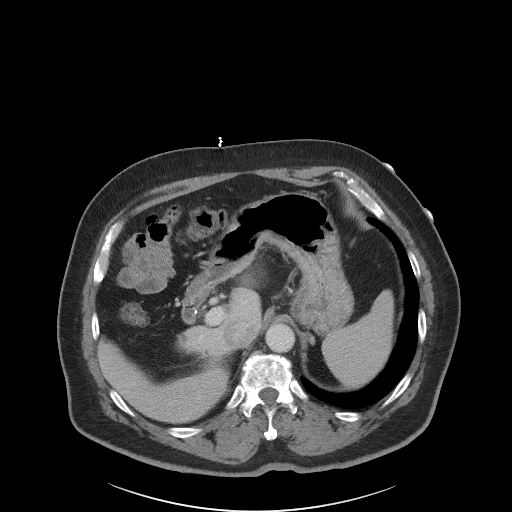
[im 79/109  bone]
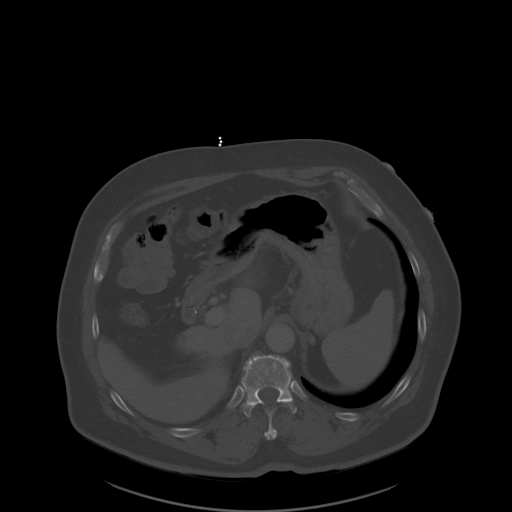
[im 85/109  soft-tissue]
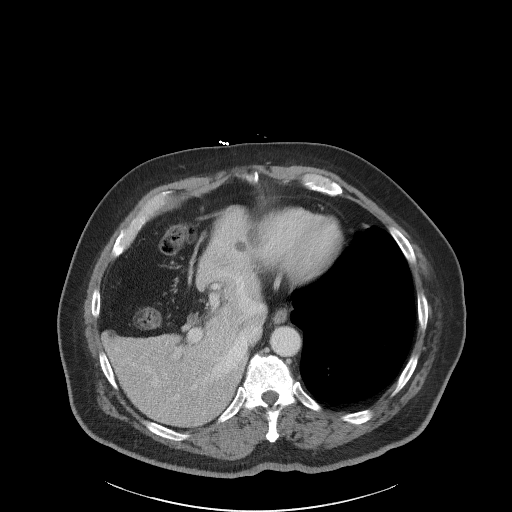
[im 91/109  soft-tissue]
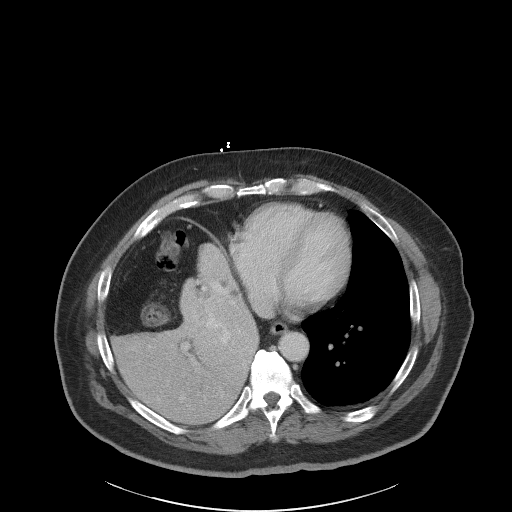
[im 103/109  soft-tissue]
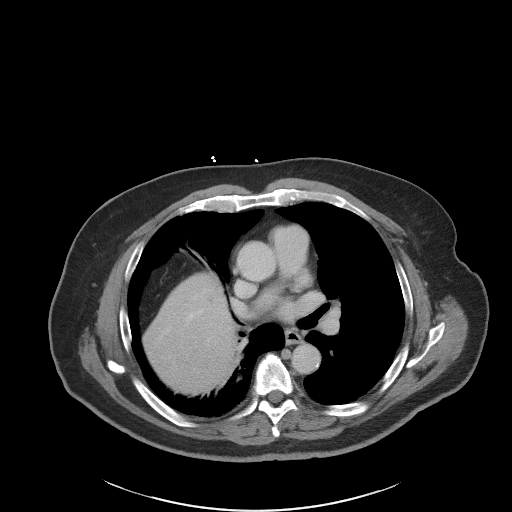

[Series 5: coronal st · coronal · 0.82mm/px · 3 of 107 slices shown]
[im 36/107  soft-tissue]
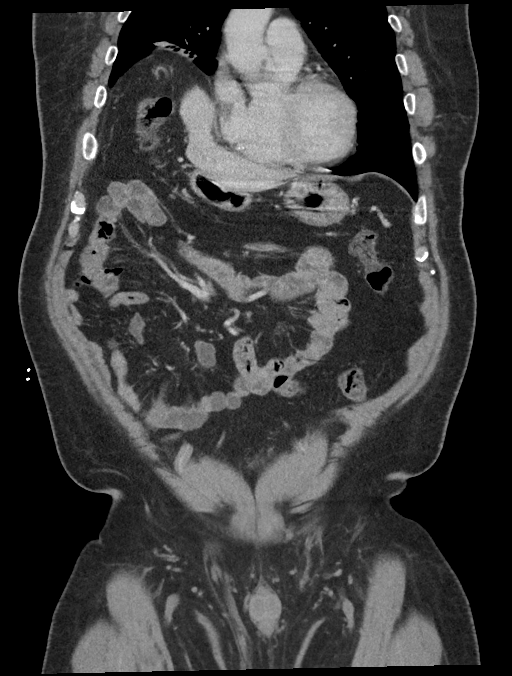
[im 48/107  soft-tissue]
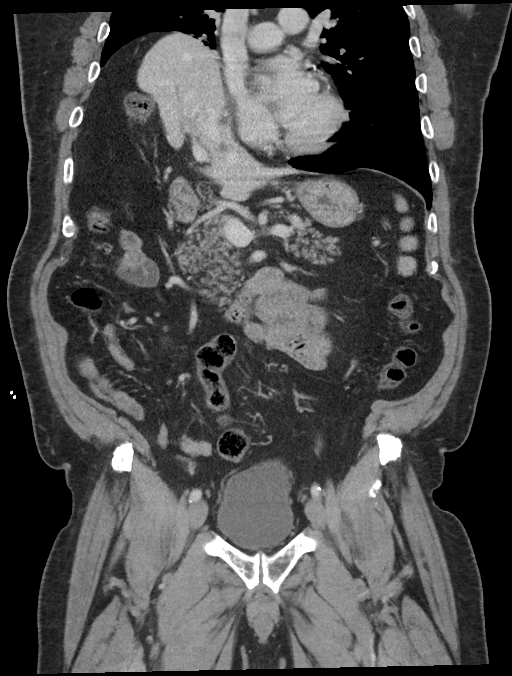
[im 59/107  soft-tissue]
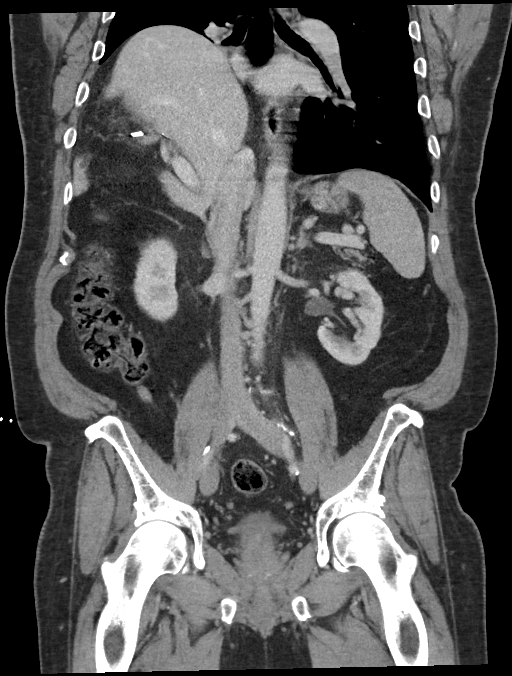

[15 of 46 positions shown; findings below may reference images not displayed]

FINDINGS: Lower chest: Moderate eventration of the right hemidiaphragm with
associated passive right lung base atelectasis, unchanged. Coronary
atherosclerosis.

Hepatobiliary: Normal liver size. A few scattered benign-appearing
liver cysts, largest 3.1 cm in the lateral segment left liver lobe
with thin internal septation. Additional scattered subcentimeter
hypodense liver lesions are too small to characterize and appear
unchanged, presumably benign. No new or suspicious liver masses.
Cholecystectomy. No biliary ductal dilatation.

Pancreas: Normal, with no mass or duct dilation.

Spleen: Normal size. No mass.

Adrenals/Urinary Tract: Normal adrenals. Normal kidneys with no
hydronephrosis and no renal mass. Normal bladder.

Stomach/Bowel: Normal non-distended stomach. Normal caliber small
bowel with no small bowel wall thickening. Normal appendix. Mild
scattered colonic diverticulosis with no large bowel wall thickening
or significant pericolonic fat stranding.

Vascular/Lymphatic: Atherosclerotic nonaneurysmal abdominal aorta.
Patent portal, splenic, hepatic and renal veins. Heterogeneously
coarsely calcified 2.7 x 1.5 cm right mesenteric mass (series
2/image 55), previously 2.2 x 1.5 cm, slightly increased. Adjacent
mildly enlarged right mesenteric noncalcified 1.9 x 1.0 cm node
(series 2/image 53), new. No additional pathologically enlarged
lymph nodes in the abdomen or pelvis.

Reproductive: Mild prostatomegaly. Nonspecific coarse internal
prostatic calcifications.

Other: No pneumoperitoneum, ascites or focal fluid collection.

Musculoskeletal: No aggressive appearing focal osseous lesions.
Moderate thoracolumbar spondylosis.
IMPRESSION: 1. Heterogeneously coarsely calcified 2.7 x 1.5 cm right mesenteric
mass, slightly increased. Adjacent mildly enlarged noncalcified
right mesenteric node, new. Findings raise concern for carcinoid
tumor. DOTATATE PET-CT could be considered for further
characterization.
2. No evidence of bowel obstruction or acute bowel inflammation.
Mild scattered colonic diverticulosis, with no evidence of acute
diverticulitis.
3. Mild prostatomegaly.
4. Coronary atherosclerosis.
5. Aortic Atherosclerosis ([4A]-[4A]).

## 2021-09-01 MED ORDER — ONDANSETRON HCL 4 MG/2ML IJ SOLN
4.0000 mg | Freq: Once | INTRAMUSCULAR | Status: AC
  Administered 2021-09-01: 4 mg via INTRAVENOUS
  Filled 2021-09-01: qty 2

## 2021-09-01 MED ORDER — IOHEXOL 350 MG/ML SOLN
100.0000 mL | Freq: Once | INTRAVENOUS | Status: AC | PRN
  Administered 2021-09-01: 100 mL via INTRAVENOUS
  Filled 2021-09-01: qty 100

## 2021-09-01 MED ORDER — ONDANSETRON 4 MG PO TBDP: 4.0000 mg | ORAL_TABLET | Freq: Three times a day (TID) | ORAL | 0 refills | Status: DC | PRN

## 2021-09-01 NOTE — Discharge Instructions (Signed)
Please call and schedule a follow up with oncology.  Return to the ER for symptoms that change, worsen, or for new concerns if unable to see primary care or oncology.

## 2021-09-01 NOTE — ED Triage Notes (Addendum)
Pt to ER via POV with reports of nausea/ generalized abdominal pain and weakness. Pt reports having a two episodes of brown/ red emesis this morning, and feeling so weak and dizzy he can't walk. Denies dark or tarry stools. Denies blood thinner usage.

## 2021-09-01 NOTE — ED Triage Notes (Signed)
FIRST NURSE NOTE: pt c/o having dizziness with nausea today, pt states "Im so dizzy I cant walk"

## 2021-09-01 NOTE — ED Provider Notes (Signed)
[facility] Emergency Department Provider Note ____________________________________________   Event Date/Time   First MD Initiated Contact with Patient 09/01/21 [phone]     (approximate)  I have reviewed the triage vital signs and the nursing notes.   HISTORY  Chief Complaint Nausea and Weakness  HPI Dominic Turner is a 75 y.o. male with history of IBS,  presents to the emergency department for treatment and evaluation of generalized abdominal pain and dizziness. Dizziness has resolved. It started after sneezing multiple times in a row. Abdominal pain and nausea has been present intermittently for 3 months or more. He has been feeling that he has bad gas all the time, no matter what he eats. He had an episode of vomiting this morning that was "slimy."  He denies diarrhea or fever.         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 Post bronchodilator test not improved.   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   Nocturia 04/30/2017   Seborrheic dermatitis of scalp 01/20/2017   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   Advanced care planning/counseling discussion 01/15/2015   Health maintenance examination 01/15/2015   COPD, severe ([place]) 04/09/2014   Beta thalassemia minor 04/10/2013   Positive PPD, treated 02/10/2013   Medicare annual wellness visit, subsequent 12/27/2012   Hearing loss 12/27/2012   ERECTILE DYSFUNCTION 03/25/2009   OBSTRUCTIVE SLEEP APNEA 06/26/2008   GASTRITIS 10/09/2007   COLONIC POLYPS, HX OF 10/09/2007   PROSTATITIS, HX OF 10/09/2007   HYPERCHOLESTEROLEMIA, PURE 05/03/2007   Severe obesity (BMI 35.0-39.9) with comorbidity ([place]) 05/03/2007   TOBACCO ABUSE 05/03/2007   HYPERTENSION, BENIGN ESSENTIAL 05/03/2007    Past Surgical History:  Procedure Laterality Date   CATARACT EXTRACTION, BILATERAL Bilateral    CHOLECYSTECTOMY N/A 12/30/2015   Procedure: LAPAROSCOPIC CHOLECYSTECTOMY;  Surgeon: Florene Glen, MD;  Location: [facility] ORS;  Service: General;  Laterality: N/A;   COLONOSCOPY  03/03/2013   tubular adenoma x3, diverticulosis, rpt 3 yrs (Dr. Cristina Gong)   COLONOSCOPY  05/2016   TA x3, diverticulosis, single angiodysplastic lesion, rpt 3 yrs (Buccini)   COLONOSCOPY  09/2019   TA, HP, rpt 5 yrs (Buccini)    Prior to Admission medications   Medication Sig Start Date End Date Taking? Authorizing Provider  ondansetron (ZOFRAN-ODT) 4 MG disintegrating tablet Take 1 tablet (4 mg total) by mouth every 8 (eight) hours as needed for nausea or vomiting. 09/01/21  Yes Seddrick Flax B, FNP  acetaminophen (TYLENOL) 500 MG tablet Take 500 mg by mouth every 6 (six) hours as  needed.    [provider]  albuterol (PROVENTIL HFA;VENTOLIN HFA) 108 (90 Base) MCG/ACT inhaler Take 2 puffs in AM & PM every day for next 5 days, & also can use every 4-6 hours as needed. After 5 days, use only every 4-6 hrs as needed 10/31/18   Jodelle Green, FNP  amLODipine (NORVASC) 10 MG tablet Take 1 tablet (10 mg total) by mouth daily. 02/18/21   Ria Bush, MD   aspirin 81 MG tablet Take 81 mg by mouth daily.    [provider]  azelastine (ASTELIN) 0.1 % nasal spray Place 1 spray into both nostrils 2 (two) times daily. Use in each nostril as directed 02/18/21   Ria Bush, MD  clobetasol (TEMOVATE) 0.05 % external solution Apply topically. 12/25/19   [provider]  Docusate Calcium (STOOL SOFTENER PO) Take 1 tablet by mouth daily.    [provider]  fluticasone (CUTIVATE) 0.05 % cream Apply topically. Apply to affected areas on face up to twice a day as needed 11/11/19   [provider]  fluticasone (FLONASE) 50 MCG/ACT nasal spray USE 2 SPRAYS IN BOTH  NOSTRILS DAILY 12/24/20   Ria Bush, MD  guaiFENesin-codeine [facility]) 100-10 MG/5ML syrup Take 5 mLs by mouth 3 (three) times daily as needed for cough or congestion (sedation precautions). 12/09/20   Ria Bush, MD  ibuprofen (ADVIL,MOTRIN) 600 MG tablet Take 1 tablet (600 mg total) by mouth 3 (three) times daily as needed. Patient taking differently: Take 600 mg by mouth daily as needed. 01/17/16   Ria Bush, MD  loratadine (CLARITIN) 10 MG tablet Take 1 tablet (10 mg total) by mouth daily. 07/22/19   Ria Bush, MD  Multiple Vitamin Essential TABS Take 1 tablet by mouth daily.    [provider]  polyethylene glycol powder (GLYCOLAX/MIRALAX) 17 GM/SCOOP powder Take 17 g by mouth daily. 07/22/19   Ria Bush, MD  pravastatin (PRAVACHOL) 40 MG tablet Take 1 tablet (40 mg total) by mouth daily. 02/18/21   Ria Bush, MD  tamsulosin (FLOMAX) 0.4 MG CAPS capsule Take 1 capsule (0.4 mg total) by mouth daily. 02/18/21   Ria Bush, MD  tiotropium (SPIRIVA HANDIHALER) 18 MCG inhalation capsule INHALE THE CONTENTS OF 1  CAPSULE BY MOUTH VIA  HANDIHALER DAILY AT BEDTIME 02/18/21   Ria Bush, MD    Allergies Aspirin, Sertraline hcl, Sulfonamide derivatives, and Wellbutrin [bupropion]  Family History   Problem Relation Age of Onset   Cirrhosis Father        died in 75's   Hypertension Mother    Heart attack Sister    Stroke Sister    Heart attack Maternal Grandmother    Stroke Maternal Grandmother    Thyroid disease Sister    Diabetes Sister    Cancer Neg Hx     Social History Social History   Tobacco Use   Smoking status: Every Day    Packs/day: 0.75    Years: 56.00    Pack years: 42.00    Types: Cigarettes   Smokeless tobacco: Never  Vaping Use   Vaping Use: Never used  Substance Use Topics   Alcohol use: No    Alcohol/week: 0.0 standard drinks   Drug use: No    Review of Systems  Constitutional: No fever/chills Eyes: No visual changes. ENT: No sore throat. Cardiovascular: Denies chest pain. Respiratory: Denies shortness of breath. Gastrointestinal: Positive for nausea and vomiting.  No diarrhea.  No constipation. Genitourinary: Negative for dysuria.  Musculoskeletal: Negative for back pain. Skin: Negative for rash. Neurological: Negative for headaches, focal weakness or numbness  ____________________________________________   PHYSICAL EXAM:  VITAL SIGNS: ED Triage Vitals  Enc Vitals Group     BP 09/01/21 0720 (!) 156/73     Pulse Rate 09/01/21 0720 73     Resp 09/01/21 0720 20     Temp 09/01/21 0720 98.9 F (37.2 C)     Temp Source 09/01/21 0720 Oral     SpO2 09/01/21 0720 97 %     Weight 09/01/21 0721 224 lb (101.6 kg)     Height 09/01/21 0721 5\' 9"  (1.753 m)     Head Circumference --      Peak Flow --      Pain Score 09/01/21 0720 10     Pain Loc --      Pain Edu? --      Excl. in [place]? --     Constitutional: Alert and oriented. Well appearing and in no acute distress. Eyes: Conjunctivae are normal.  Head: Atraumatic. Nose: No congestion/rhinnorhea. Mouth/Throat: Mucous membranes are moist.  Oropharynx non-erythematous. Neck: No stridor.   Hematological/Lymphatic/Immunilogical: No cervical lymphadenopathy. Cardiovascular: Normal rate,  regular rhythm. Grossly normal heart sounds.  Good peripheral circulation. Respiratory: Normal respiratory effort.  No retractions. Lungs CTAB. Gastrointestinal: Soft and nontender. No distention. No abdominal bruits. Normal bowel sounds.  Genitourinary:  Musculoskeletal: No lower extremity tenderness nor edema.  No joint effusions. Neurologic:  Normal speech and language. No gross focal neurologic deficits are appreciated. No gait instability. Skin:  Skin is warm, dry and intact. No rash noted. Psychiatric: Mood and affect are normal. Speech and behavior are normal.  ____________________________________________   LABS (all labs ordered are listed, but only abnormal results are displayed)  Labs Reviewed  COMPREHENSIVE METABOLIC PANEL - Abnormal; Notable for the following components:      Result Value   Potassium 3.1 (*)    Glucose, Bld 126 (*)    BUN 7 (*)    All other components within normal limits  CBC - Abnormal; Notable for the following components:   RBC 6.48 (*)    MCV 73.6 (*)    MCH 24.5 (*)    RDW 18.2 (*)    All other components within normal limits  RESP PANEL BY RT-PCR (FLU A&B, COVID) ARPGX2  POC OCCULT BLOOD, ED  TYPE AND SCREEN   ____________________________________________  EKG  ED ECG REPORT I, Stephine Langbehn, FNP-BC personally viewed and interpreted this ECG.   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  I, Sherrie George, personally viewed and evaluated these images (plain radiographs) as part of my medical decision making, as well as reviewing the written report by the radiologist.  Official radiology report(s): CT ABDOMEN PELVIS W CONTRAST  Result Date: 09/01/2021 CLINICAL DATA:  Abdominal pain,  acute, nonlocalized. Dizziness with nausea. EXAM: CT ABDOMEN AND PELVIS WITH CONTRAST TECHNIQUE: Multidetector CT imaging of the abdomen and pelvis was performed using the standard protocol following bolus administration of intravenous contrast. CONTRAST:  11mL OMNIPAQUE IOHEXOL 350 MG/ML SOLN COMPARISON:  03/30/2020 CT abdomen/pelvis. FINDINGS: Lower chest: Moderate eventration of the right hemidiaphragm with associated passive right lung base atelectasis, unchanged. Coronary atherosclerosis. Hepatobiliary: Normal liver size. A few scattered benign-appearing liver  cysts, largest 3.1 cm in the lateral segment left liver lobe with thin internal septation. Additional scattered subcentimeter hypodense liver lesions are too small to characterize and appear unchanged, presumably benign. No new or suspicious liver masses. Cholecystectomy. No biliary ductal dilatation. Pancreas: Normal, with no mass or duct dilation. Spleen: Normal size. No mass. Adrenals/Urinary Tract: Normal adrenals. Normal kidneys with no hydronephrosis and no renal mass. Normal bladder. Stomach/Bowel: Normal non-distended stomach. Normal caliber small bowel with no small bowel wall thickening. Normal appendix. Mild scattered colonic diverticulosis with no large bowel wall thickening or significant pericolonic fat stranding. Vascular/Lymphatic: Atherosclerotic nonaneurysmal abdominal aorta. Patent portal, splenic, hepatic and renal veins. Heterogeneously coarsely calcified 2.7 x 1.5 cm right mesenteric mass (series 2/image 55), previously 2.2 x 1.5 cm, slightly increased. Adjacent mildly enlarged right mesenteric noncalcified 1.9 x 1.0 cm node (series 2/image 53), new. No additional pathologically enlarged lymph nodes in the abdomen or pelvis. Reproductive: Mild prostatomegaly. Nonspecific coarse internal prostatic calcifications. Other: No pneumoperitoneum, ascites or focal fluid collection. Musculoskeletal: No aggressive appearing focal osseous  lesions. Moderate thoracolumbar spondylosis. IMPRESSION: 1. 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 Adjacent mildly enlarged noncalcified right mesenteric node, new. 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 DOTATATE PET-CT could be considered for further characterization. 2. No evidence of bowel obstruction or acute bowel inflammation. Mild scattered colonic diverticulosis, with no evidence of acute diverticulitis. 3. Mild prostatomegaly. 4. Coronary atherosclerosis. 5. Aortic Atherosclerosis (ICD10-I70.0). Electronically Signed   By: Ilona Sorrel M.D.   On: 09/01/2021 09:19    ____________________________________________   PROCEDURES  Procedure(s) performed (including Critical Care):  Procedures  ____________________________________________   INITIAL IMPRESSION / ASSESSMENT AND PLAN     75 year old male presents to the ER for dizziness and abdominal pain. See HPI. Will review labs, ekg, and get CT of the abdomen. Zofran ordered for nausea.  DIFFERENTIAL DIAGNOSIS  SBO; ileus; colitis; intraabdominal pathology  ED COURSE  Labs indicate mild hypokalemia at 3.1, normal Hgb and Hct, no leukocytosis. COVID and influenza negative.  CT abdomen is concerning for mesenteric mass, new since CT last year. Results were discussed with the patient. He will call and schedule a follow up with oncology. Zofran prescribed for nausea as it did provide relief while here. For concerns, if unable to see oncology or primary care, he is to return to the ER.    ___________________________________________   FINAL CLINICAL IMPRESSION(S) / ED DIAGNOSES  Final diagnoses:  Mesenteric mass  Calcified mesenteric mass     ED Discharge Orders          Ordered    ondansetron (ZOFRAN-ODT) 4 MG disintegrating tablet  Every 8 hours PRN        09/01/21 0940             Dominic Turner was evaluated in Emergency Department on 09/01/2021 for the  symptoms described in the history of present illness. He was evaluated in the context of the global COVID-19 pandemic, which necessitated consideration that the patient might be at risk for infection with the SARS-CoV-2 virus that causes COVID-19. Institutional protocols and algorithms that pertain to the evaluation of patients at risk for COVID-19 are in a state of rapid change based on information released by regulatory bodies including the CDC and federal and state organizations. These policies and algorithms were followed during the patient's care in the ED.   Note:  This document was prepared using Systems analyst and may include  unintentional dictation errors.    Victorino Dike, FNP 09/01/21 1437    Harvest Dark, MD 09/01/21 1521

## 2021-09-02 ENCOUNTER — Telehealth: Payer: Self-pay | Admitting: Internal Medicine

## 2021-09-02 NOTE — Telephone Encounter (Signed)
Called patient to give a earlier appt to see Dr Rogue Bussing in [place]. Patient states that Dominic Turner was too far for him and he would take the next appt in [facility]

## 2021-09-09 ENCOUNTER — Inpatient Hospital Stay: Payer: Medicare HMO | Attending: Internal Medicine | Admitting: Internal Medicine

## 2021-09-09 ENCOUNTER — Inpatient Hospital Stay: Payer: Medicare HMO

## 2021-09-09 ENCOUNTER — Telehealth: Payer: Self-pay | Admitting: Family Medicine

## 2021-09-09 ENCOUNTER — Encounter: Payer: Self-pay | Admitting: Internal Medicine

## 2021-09-09 DIAGNOSIS — Z886 Allergy status to analgesic agent status: Secondary | ICD-10-CM | POA: Diagnosis not present

## 2021-09-09 DIAGNOSIS — R590 Localized enlarged lymph nodes: Secondary | ICD-10-CM | POA: Insufficient documentation

## 2021-09-09 DIAGNOSIS — R112 Nausea with vomiting, unspecified: Secondary | ICD-10-CM | POA: Diagnosis not present

## 2021-09-09 DIAGNOSIS — I700 Atherosclerosis of aorta: Secondary | ICD-10-CM | POA: Diagnosis not present

## 2021-09-09 DIAGNOSIS — Z833 Family history of diabetes mellitus: Secondary | ICD-10-CM | POA: Insufficient documentation

## 2021-09-09 DIAGNOSIS — I2510 Atherosclerotic heart disease of native coronary artery without angina pectoris: Secondary | ICD-10-CM | POA: Diagnosis not present

## 2021-09-09 DIAGNOSIS — F17210 Nicotine dependence, cigarettes, uncomplicated: Secondary | ICD-10-CM | POA: Diagnosis not present

## 2021-09-09 DIAGNOSIS — Z823 Family history of stroke: Secondary | ICD-10-CM | POA: Insufficient documentation

## 2021-09-09 DIAGNOSIS — Z86010 Personal history of colonic polyps: Secondary | ICD-10-CM | POA: Insufficient documentation

## 2021-09-09 DIAGNOSIS — K6389 Other specified diseases of intestine: Secondary | ICD-10-CM | POA: Insufficient documentation

## 2021-09-09 DIAGNOSIS — E7800 Pure hypercholesterolemia, unspecified: Secondary | ICD-10-CM | POA: Diagnosis not present

## 2021-09-09 DIAGNOSIS — Z8379 Family history of other diseases of the digestive system: Secondary | ICD-10-CM | POA: Insufficient documentation

## 2021-09-09 DIAGNOSIS — Z8249 Family history of ischemic heart disease and other diseases of the circulatory system: Secondary | ICD-10-CM | POA: Diagnosis not present

## 2021-09-09 DIAGNOSIS — I10 Essential (primary) hypertension: Secondary | ICD-10-CM | POA: Diagnosis not present

## 2021-09-09 DIAGNOSIS — R1909 Other intra-abdominal and pelvic swelling, mass and lump: Secondary | ICD-10-CM | POA: Insufficient documentation

## 2021-09-09 DIAGNOSIS — Z9049 Acquired absence of other specified parts of digestive tract: Secondary | ICD-10-CM

## 2021-09-09 DIAGNOSIS — Z8719 Personal history of other diseases of the digestive system: Secondary | ICD-10-CM | POA: Diagnosis not present

## 2021-09-09 DIAGNOSIS — R1013 Epigastric pain: Secondary | ICD-10-CM | POA: Diagnosis not present

## 2021-09-09 DIAGNOSIS — Z8349 Family history of other endocrine, nutritional and metabolic diseases: Secondary | ICD-10-CM | POA: Diagnosis not present

## 2021-09-09 DIAGNOSIS — K5900 Constipation, unspecified: Secondary | ICD-10-CM | POA: Diagnosis not present

## 2021-09-09 DIAGNOSIS — R140 Abdominal distension (gaseous): Secondary | ICD-10-CM

## 2021-09-09 DIAGNOSIS — Z79899 Other long term (current) drug therapy: Secondary | ICD-10-CM | POA: Diagnosis not present

## 2021-09-09 DIAGNOSIS — J449 Chronic obstructive pulmonary disease, unspecified: Secondary | ICD-10-CM | POA: Insufficient documentation

## 2021-09-09 DIAGNOSIS — N400 Enlarged prostate without lower urinary tract symptoms: Secondary | ICD-10-CM

## 2021-09-09 DIAGNOSIS — Z882 Allergy status to sulfonamides status: Secondary | ICD-10-CM | POA: Insufficient documentation

## 2021-09-09 LAB — COMPREHENSIVE METABOLIC PANEL
ALT: 14 U/L (ref 0–44)
AST: 18 U/L (ref 15–41)
Albumin: 4.1 g/dL (ref 3.5–5.0)
Alkaline Phosphatase: 102 U/L (ref 38–126)
Anion gap: 8 (ref 5–15)
BUN: 8 mg/dL (ref 8–23)
CO2: 28 mmol/L (ref 22–32)
Calcium: 9.4 mg/dL (ref 8.9–10.3)
Chloride: 101 mmol/L (ref 98–111)
Creatinine, Ser: 1.14 mg/dL (ref 0.61–1.24)
GFR, Estimated: >60 mL/min (ref >60)
Glucose, Bld: 103 mg/dL — ABNORMAL HIGH (ref 70–99)
Potassium: 3.4 mmol/L — ABNORMAL LOW (ref 3.5–5.1)
Sodium: 137 mmol/L (ref 135–145)
Total Bilirubin: 0.8 mg/dL (ref 0.3–1.2)
Total Protein: 6.9 g/dL (ref 6.5–8.1)

## 2021-09-09 LAB — CBC WITH DIFFERENTIAL/PLATELET
Abs Immature Granulocytes: 0.03 10*3/uL (ref 0.00–0.07)
Basophils Absolute: 0.1 10*3/uL (ref 0.0–0.1)
Basophils Relative: 1 %
Eosinophils Absolute: 0.3 10*3/uL (ref 0.0–0.5)
Eosinophils Relative: 5 %
HCT: 47.6 % (ref 39.0–52.0)
Hemoglobin: 15.7 g/dL (ref 13.0–17.0)
Immature Granulocytes: 1 %
Lymphocytes Relative: 34 %
Lymphs Abs: 2 10*3/uL (ref 0.7–4.0)
MCH: 24.8 pg — ABNORMAL LOW (ref 26.0–34.0)
MCHC: 33 g/dL (ref 30.0–36.0)
MCV: 75.2 fL — ABNORMAL LOW (ref 80.0–100.0)
Monocytes Absolute: 0.5 10*3/uL (ref 0.1–1.0)
Monocytes Relative: 9 %
Neutro Abs: 3.1 10*3/uL (ref 1.7–7.7)
Neutrophils Relative %: 50 %
Platelets: 194 10*3/uL (ref 150–400)
RBC: 6.33 MIL/uL — ABNORMAL HIGH (ref 4.22–5.81)
RDW: 18.1 % — ABNORMAL HIGH (ref 11.5–15.5)
WBC: 6 10*3/uL (ref 4.0–10.5)
nRBC: 0 % (ref 0.0–0.2)

## 2021-09-09 LAB — LACTATE DEHYDROGENASE: LDH: 135 U/L (ref 98–192)

## 2021-09-09 MED ORDER — OMEPRAZOLE 20 MG PO CPDR: 20.0000 mg | DELAYED_RELEASE_CAPSULE | Freq: Every day | ORAL | 1 refills | Status: DC

## 2021-09-09 NOTE — Telephone Encounter (Signed)
Spoke with patient.  No significant abd pain.  Ongoing gassiness, bloating despite gasX and align probiotic.  Rec trial omeprazole while he gets in to see onc. If appt cancelled due to increment weather today Darcel Bayley), advised to let us know if any trouble rescheduling onc appt.

## 2021-09-09 NOTE — Progress Notes (Signed)
Patient states he has gas at night and is having a lot of stomach problems. He has been having this problems with his stomach since the late 1970's but has worsened over time.

## 2021-09-09 NOTE — Progress Notes (Signed)
[facility] OFFICE PROGRESS NOTE  Patient Care Team: Ria Bush, MD as PCP - General  Cancer Staging No matching staging information was found for the patient.   Oncology History   No history exists.    MPRESSION: 1. 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 Adjacent mildly enlarged noncalcified right mesenteric node, new. 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 DOTATATE PET-CT could be considered for further characterization. 2. No evidence of bowel obstruction or acute bowel inflammation. Mild scattered colonic diverticulosis, with no evidence of acute diverticulitis. 3. Mild prostatomegaly. 4. Coronary atherosclerosis. 5. Aortic Atherosclerosis (ICD10-I70   HISTORY OF PRESENT ILLNESS: Patient alone.  Ambulating independently.  Dominic Turner 75 y.o.  male pleasant patient above history of history of chronic GI/abdominal discomfort has been referred to [place] for further evaluation of his mesenteric mass.   Patient states that he was recently evaluated in the emergency room for acute onset of nausea vomiting tinnitus clinically suggestive of vertigo.  Patient is CT scan in the emergency room showed approximately 1 in size mesenteric mass/adjacent lymphadenopathy.   Patient states that he has chronic abdominal issues since 1971 [coming from [place]; prior history of EGD done recently.  Colonoscopy 2021 [Dr.Buccini; [place]].  Complains of bloating.  Complains of constipation/fecal pellets.   No weight loss.  No flushing.  No diarrhea.  Review of Systems  Constitutional:  Negative for chills, diaphoresis, fever, malaise/fatigue and weight loss.  HENT:  Negative for nosebleeds and sore throat.   Eyes:  Negative for double vision.  Respiratory:  Negative for cough, hemoptysis, sputum production, shortness of breath and wheezing.   Cardiovascular:  Negative for chest pain, palpitations, orthopnea and leg  swelling.  Gastrointestinal:  Positive for abdominal pain, constipation and nausea. Negative for blood in stool, diarrhea, melena and vomiting.  Genitourinary:  Negative for dysuria, frequency and urgency.  Musculoskeletal:  Negative for back pain and joint pain.  Skin: Negative.  Negative for itching and rash.  Neurological:  Negative for dizziness, tingling, focal weakness, weakness and headaches.  Endo/Heme/Allergies:  Does not bruise/bleed easily.  Psychiatric/Behavioral:  Negative for depression. The patient is not nervous/anxious and does not have insomnia.      PAST MEDICAL HISTORY :  Past Medical History:  Diagnosis Date  . Acute cholecystitis 12/2015  . ANXIETY DEPRESSION 10/23/2008  . 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 Bilateral high frequency sensorineural hearing loss    rec hearing aides by ENT  . 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 Post bronchodilator test not improved.  . Diverticulosis 02/2013   by colonoscopy  . Essential hypertension, benign   . History of adenomatous polyp of colon 02/2013   rec rpt 3 yrs  . Overweight(278.02)   . 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 Pure hypercholesterolemia   . Tobacco use disorder   . Unspecified gastritis and gastroduodenitis without mention of hemorrhage   . Unspecified sleep apnea     PAST SURGICAL HISTORY :   Past Surgical History:  Procedure Laterality Date  . CATARACT EXTRACTION, BILATERAL Bilateral   . CHOLECYSTECTOMY N/A 12/30/2015   Procedure: LAPAROSCOPIC CHOLECYSTECTOMY;  Surgeon: Florene Glen, MD;  Location: [facility] ORS;  Service: General;  Laterality: N/A;  . COLONOSCOPY  03/03/2013   tubular adenoma x3, diverticulosis, rpt 3 yrs (Dr. Cristina Gong)  . COLONOSCOPY  05/2016  TA x3, diverticulosis, single angiodysplastic lesion, rpt 3 yrs (Buccini)  . COLONOSCOPY  09/2019   TA, HP, rpt 5 yrs (Buccini)     FAMILY HISTORY :   Family History  Problem Relation Age of Onset  . Cirrhosis Father        died in 84's  . Hypertension Mother   . Heart attack Sister   . Stroke Sister   . Heart attack Maternal Grandmother   . Stroke Maternal Grandmother   . Thyroid disease Sister   . Diabetes Sister   . Cancer Neg Hx     SOCIAL HISTORY:   Social History   Tobacco Use  . Smoking status: Every Day    Packs/day: 0.75    Years: 56.00    Pack years: 42.00    Types: Cigarettes  . Smokeless tobacco: Never  Vaping Use  . Vaping Use: Never used  Substance Use Topics  . Alcohol use: No    Alcohol/week: 0.0 standard drinks  . Drug use: No    ALLERGIES:  is allergic to aspirin, sertraline hcl, sulfonamide derivatives, and wellbutrin [bupropion].  MEDICATIONS:  Current Outpatient Medications  Medication Sig Dispense Refill  . acetaminophen (TYLENOL) 500 MG tablet Take 500 mg by mouth every 6 (six) hours as needed.    Marland Kitchen albuterol (PROVENTIL HFA;VENTOLIN HFA) 108 (90 Base) MCG/ACT inhaler Take 2 puffs in AM & PM every day for next 5 days, & also can use every 4-6 hours as needed. After 5 days, use only every 4-6 hrs as needed 1 Inhaler 0  . amLODipine (NORVASC) 10 MG tablet Take 1 tablet (10 mg total) by mouth daily. 90 tablet 3  . azelastine (ASTELIN) 0.1 % nasal spray Place 1 spray into both nostrils 2 (two) times daily. Use in each nostril as directed 30 mL 11  . clobetasol (TEMOVATE) 0.05 % external solution Apply topically.    Mariane Baumgarten Calcium (STOOL SOFTENER PO) Take 1 tablet by mouth daily.    . fluticasone (CUTIVATE) 0.05 % cream Apply topically. Apply to affected areas on face up to twice a day as needed    . fluticasone (FLONASE) 50 MCG/ACT nasal spray USE 2 SPRAYS IN BOTH  NOSTRILS DAILY 48 g 3  . guaiFENesin-codeine (ROBITUSSIN AC) 100-10 MG/5ML syrup Take 5 mLs by mouth 3 (three) times daily as needed for cough or congestion (sedation precautions). 100 mL 0  . loratadine  (CLARITIN) 10 MG tablet Take 1 tablet (10 mg total) by mouth daily.    . Multiple Vitamin Essential TABS Take 1 tablet by mouth daily.    Marland Kitchen omeprazole (PRILOSEC) 20 MG capsule Take 1 capsule (20 mg total) by mouth daily. For 3 weeks then as needed 30 capsule 1  . ondansetron (ZOFRAN-ODT) 4 MG disintegrating tablet Take 1 tablet (4 mg total) by mouth every 8 (eight) hours as needed for nausea or vomiting. 20 tablet 0  . polyethylene glycol powder (GLYCOLAX/MIRALAX) 17 GM/SCOOP powder Take 17 g by mouth daily. 3350 g 1  . pravastatin (PRAVACHOL) 40 MG tablet Take 1 tablet (40 mg total) by mouth daily. 90 tablet 3  . tamsulosin (FLOMAX) 0.4 MG CAPS capsule Take 1 capsule (0.4 mg total) by mouth daily. 90 capsule 3  . tiotropium (SPIRIVA HANDIHALER) 18 MCG inhalation capsule INHALE THE CONTENTS OF 1  CAPSULE BY MOUTH VIA  HANDIHALER DAILY AT BEDTIME 90 capsule 3  . traZODone (DESYREL) 50 MG tablet TAKE ONE AND ONE-HALF TABLETS BY MOUTH AT BEDTIME AS  NEEDED    . aspirin 81 MG tablet Take 81 mg by mouth daily. (Patient not taking: Reported on 09/09/2021)     Current Facility-Administered Medications  Medication Dose Route Frequency Provider Last Rate Last Admin  . albuterol (PROVENTIL) (2.5 MG/3ML) 0.083% nebulizer solution 2.5 mg  2.5 mg Nebulization Once Guse, Jacquelynn Cree, FNP      . betamethasone acetate-betamethasone sodium phosphate (CELESTONE) injection 3 mg  3 mg Intramuscular Once Edrick Kins, DPM        PHYSICAL EXAMINATION: ECOG PERFORMANCE STATUS: 0 - Asymptomatic  BP (!) 145/75   Pulse 88   Temp (!) 97 F (36.1 C)   Resp 20   Wt 226 lb 12.8 oz (102.9 kg)   SpO2 100%   BMI 33.49 kg/m   Filed Weights   09/09/21 1326  Weight: 226 lb 12.8 oz (102.9 kg)    Physical Exam Vitals and nursing note reviewed.  HENT:     Head: Normocephalic and atraumatic.     Mouth/Throat:     Pharynx: Oropharynx is clear.  Eyes:     Extraocular Movements: Extraocular movements intact.      Pupils: Pupils are equal, round, and reactive to light.  Cardiovascular:     Rate and Rhythm: Normal rate and regular rhythm.  Pulmonary:     Comments: Decreased breath sounds bilaterally.  Abdominal:     Palpations: Abdomen is soft.  Musculoskeletal:        General: Normal range of motion.     Cervical back: Normal range of motion.  Skin:    General: Skin is warm.  Neurological:     General: No focal deficit present.     Mental Status: He is alert and oriented to person, place, and time.  Psychiatric:        Behavior: Behavior normal.        Judgment: Judgment normal.       LABORATORY DATA:  I have reviewed the data as listed    Component Value Date/Time   NA 137 09/09/2021 1430   K 3.4 (L) 09/09/2021 1430   CL 101 09/09/2021 1430   CO2 28 09/09/2021 1430   GLUCOSE 103 (H) 09/09/2021 1430   BUN 8 09/09/2021 1430   CREATININE 1.14 09/09/2021 1430   CALCIUM 9.4 09/09/2021 1430   PROT 6.9 09/09/2021 1430   ALBUMIN 4.1 09/09/2021 1430   AST 18 09/09/2021 1430   ALT 14 09/09/2021 1430   ALKPHOS 102 09/09/2021 1430   BILITOT 0.8 09/09/2021 1430   GFRNONAA >60 09/09/2021 1430   GFRAA >60 03/30/2020 0958    No results found for: SPEP, UPEP  Lab Results  Component Value Date   WBC 6.0 09/09/2021   NEUTROABS 3.1 09/09/2021   HGB 15.7 09/09/2021   HCT 47.6 09/09/2021   MCV 75.2 (L) 09/09/2021   PLT 194 09/09/2021      Chemistry      Component Value Date/Time   NA 137 09/09/2021 1430   K 3.4 (L) 09/09/2021 1430   CL 101 09/09/2021 1430   CO2 28 09/09/2021 1430   BUN 8 09/09/2021 1430   CREATININE 1.14 09/09/2021 1430      Component Value Date/Time   CALCIUM 9.4 09/09/2021 1430   ALKPHOS 102 09/09/2021 1430   AST 18 09/09/2021 1430   ALT 14 09/09/2021 1430   BILITOT 0.8 09/09/2021 1430       RADIOGRAPHIC STUDIES: I have personally reviewed the radiological images as listed and agreed  with the findings in the report. No results found.   ASSESSMENT &  PLAN:  Mesenteric mass #Mesenteric mass- heterogeneously coarsely calcified 2.7 x 1.5 cm right mesenteric mass, slightly increased. Adjacent mildly enlarged noncalcified right mesenteric node, new.  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  Recommend gallium dotatate scan.  # Dyspepsia- Bloating NOt on improved on Gasex/ Probiotics [previously improved]- Dr.Buccini.  Recommend reaching out to GI regarding further work-up including EGD.  Clinically I doubt if the mesenteric mass is causing any of the patient's symptoms.  # Smoking:  Discussed with the patient regarding the ill effects of smoking- including but not limited to cardiac lung and vascular diseases and malignancies. Counseled against smoking; patient states is quite anxious/wife is sick.  Lung cancer screening.  Thank you allowing me to participate in the care of your pleasant patient. Please do not hesitate to contact me with questions or concerns in the interim.  # DISPOSITION: # PET scan ASAP # labs today # follow up in 2 weeks; MD; no labs- Dr.B  # I reviewed the blood work- with the patient in detail; also reviewed the imaging independently [as summarized above]; and with the patient in detail.    Orders Placed This Encounter  Procedures  . NM PET (NETSPOT GA 9 DOTATATE) SKULL BASE TO MID THIGH    Standing Status:   Future    Standing Expiration Date:   09/09/2022    Order Specific Question:   If indicated for the ordered procedure, I authorize the administration of a radiopharmaceutical per Radiology protocol    Answer:   Yes    Order Specific Question:   Preferred imaging location?    Answer:   [facility]  . 5 HIAA, quantitative, urine, 24 hour    Standing Status:   Future    Standing Expiration Date:   09/09/2022  . Chromogranin A    Standing Status:   Future    Number of Occurrences:   1    Standing Expiration Date:   09/09/2022  . CBC with Differential/Platelet    Standing Status:   Future    Number of Occurrences:   1     Standing Expiration Date:   09/09/2022  . Comprehensive metabolic panel    Standing Status:   Future    Number of Occurrences:   1    Standing Expiration Date:   09/09/2022  . Lactate dehydrogenase    Standing Status:   Future    Number of Occurrences:   1    Standing Expiration Date:   09/09/2022   All questions were answered. The patient knows to call the clinic with any problems, questions or concerns.      Cammie Sickle, MD 09/09/2021 3:25 PM

## 2021-09-09 NOTE — Assessment & Plan Note (Addendum)
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 Adjacent mildly enlarged noncalcified right mesenteric node, new.  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  Recommend gallium dotatate scan.  # Dyspepsia- Bloating NOt on improved on Gasex/ Probiotics [previously improved]- Dr.Buccini.  Recommend reaching out to GI regarding further work-up including EGD.  Clinically I doubt if the mesenteric mass is causing any of the patient's symptoms.  # Smoking:  Discussed with the patient regarding the ill effects of smoking- including but not limited to cardiac lung and vascular diseases and malignancies. Counseled against smoking; patient states is quite anxious/wife is sick.  Lung cancer screening.  Thank you allowing me to participate in the care of your pleasant patient. Please do not hesitate to contact me with questions or concerns in the interim.  # DISPOSITION: # PET scan ASAP # labs today # follow up in 2 weeks; MD; no labs- Dr.B  # I reviewed the blood work- with the patient in detail; also reviewed the imaging independently [as summarized above]; and with the patient in detail.

## 2021-09-12 ENCOUNTER — Inpatient Hospital Stay: Payer: Medicare HMO | Attending: Internal Medicine

## 2021-09-12 DIAGNOSIS — K6389 Other specified diseases of intestine: Secondary | ICD-10-CM | POA: Diagnosis not present

## 2021-09-12 LAB — CHROMOGRANIN A: Chromogranin A (ng/mL): 88.3 ng/mL (ref 0.0–101.8)

## 2021-09-13 ENCOUNTER — Other Ambulatory Visit: Payer: Medicare HMO

## 2021-09-13 ENCOUNTER — Other Ambulatory Visit: Payer: Self-pay

## 2021-09-13 DIAGNOSIS — K6389 Other specified diseases of intestine: Secondary | ICD-10-CM

## 2021-09-14 ENCOUNTER — Other Ambulatory Visit: Payer: Self-pay

## 2021-09-14 ENCOUNTER — Ambulatory Visit
Admission: RE | Admit: 2021-09-14 | Discharge: 2021-09-14 | Disposition: A | Discharge status: Home / Self Care | Payer: Medicare HMO | Source: Ambulatory Visit | Attending: Internal Medicine | Admitting: Internal Medicine

## 2021-09-14 DIAGNOSIS — K6389 Other specified diseases of intestine: Secondary | ICD-10-CM | POA: Insufficient documentation

## 2021-09-14 DIAGNOSIS — C7A00 Malignant carcinoid tumor of unspecified site: Secondary | ICD-10-CM | POA: Diagnosis not present

## 2021-09-14 DIAGNOSIS — I700 Atherosclerosis of aorta: Secondary | ICD-10-CM | POA: Diagnosis not present

## 2021-09-14 DIAGNOSIS — Z86012 Personal history of benign carcinoid tumor: Secondary | ICD-10-CM | POA: Diagnosis not present

## 2021-09-14 DIAGNOSIS — I2510 Atherosclerotic heart disease of native coronary artery without angina pectoris: Secondary | ICD-10-CM | POA: Diagnosis not present

## 2021-09-14 IMAGING — CT NM PET SKULL BASE TO THIGH
9 series · 25 of 25 positions shown · non-contrast
Comparison: [DATE]

CLINICAL DATA: Carcinoid disease.  Mesenteric mass.

EXAM:
NUCLEAR MEDICINE PET SKULL BASE TO THIGH
TECHNIQUE: 5.8 mCi Ga 68 DOTATATE was injected intravenously. Full-ring PET
imaging was performed from the skull base to thigh after the
radiotracer. CT data was obtained and used for attenuation
correction and anatomic localization.

[Series 3: ct wb 5.0 b30f · axial · 5.0mm · 0.98mm/px · z∈[-324,+660]mm · 3 of 329 slices shown]
[im 1/329]
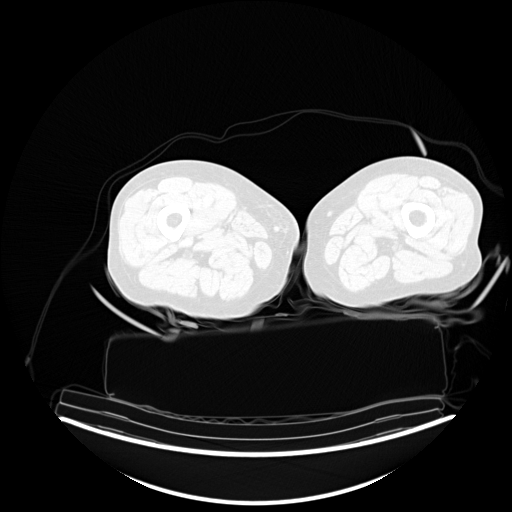
[im 165/329]
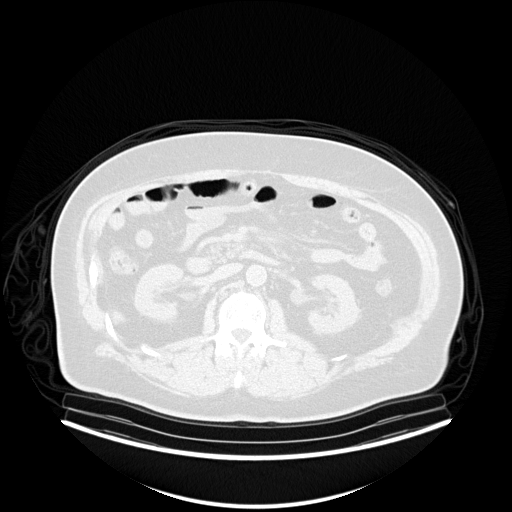
[im 329/329  brain]
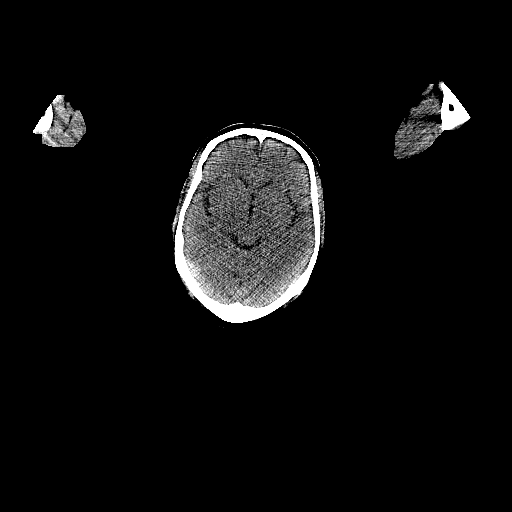

[Series 5: pet wb uncorrected (nac) · axial · 5.0mm · 4.07mm/px · z∈[-324,+660]mm · 4 of 329 slices shown]
[im 1/329]
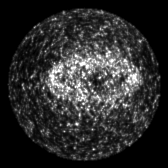
[im 110/329]
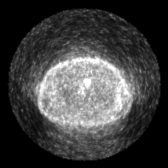
[im 219/329]
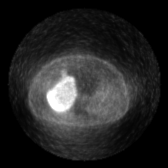
[im 329/329]
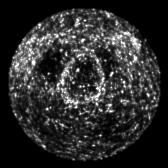

[Series 6: pet wb (ac) · axial · 5.0mm · 3.39mm/px · z∈[-324,+660]mm · 4 of 329 slices shown]
[im 1/329]
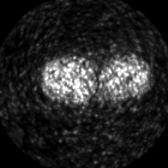
[im 110/329]
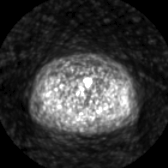
[im 219/329]
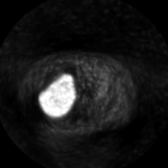
[im 329/329]
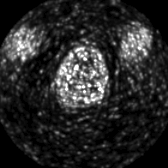

[Series 603: pet_ct axial fused · 4 of 324 slices shown]
[im 1/324]
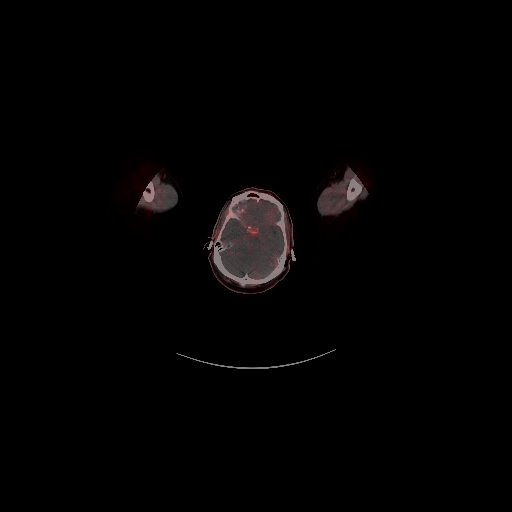
[im 108/324]
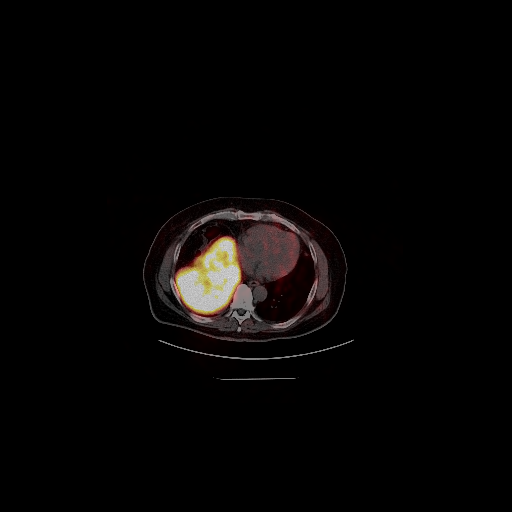
[im 216/324]
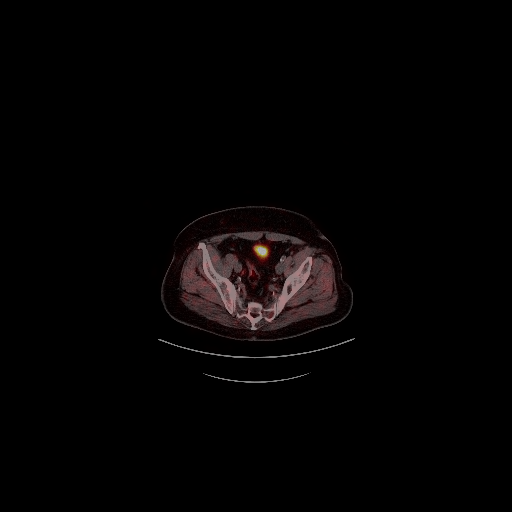
[im 324/324]
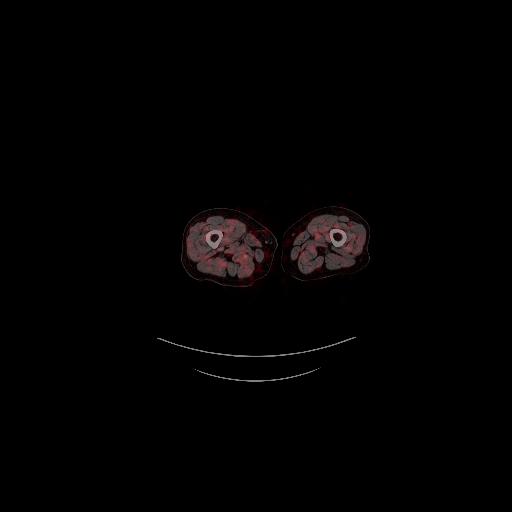

[Series 604: pet_ct coronal fused · 1 of 91 slices shown]
[im 1/91]
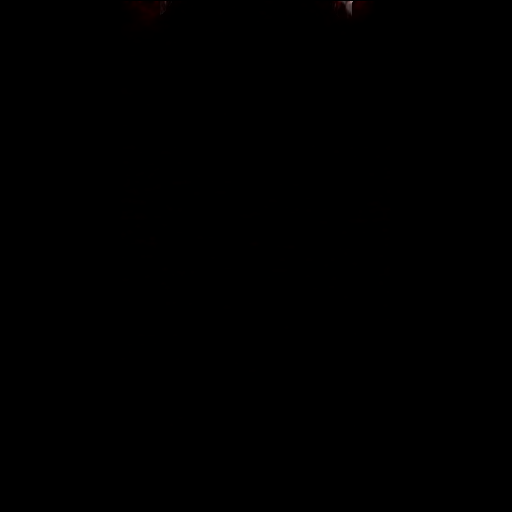

[Series 605: pet_ct sagittal fused · 2 of 176 slices shown]
[im 1/176]
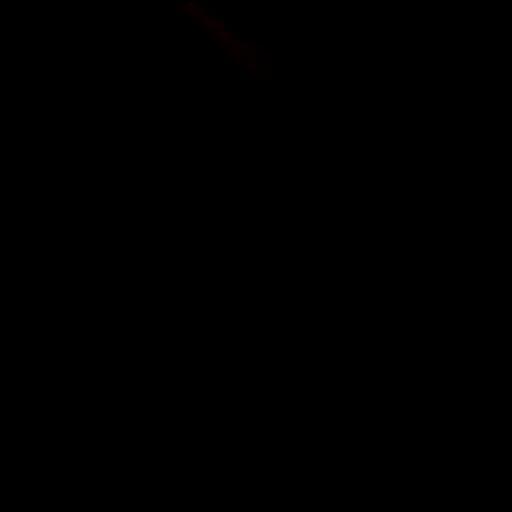
[im 176/176]
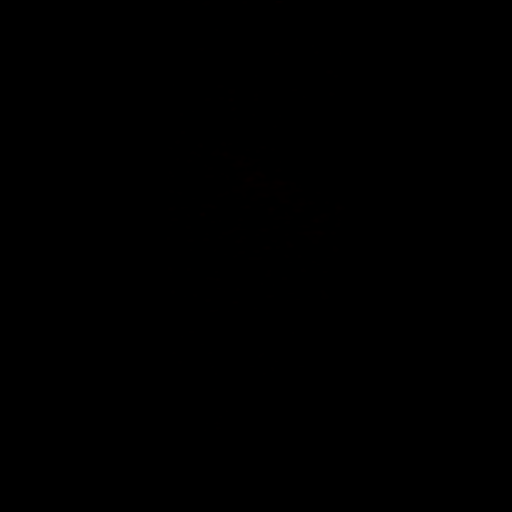

[Series 606: pet axial · 4 of 327 slices shown]
[im 1/327]
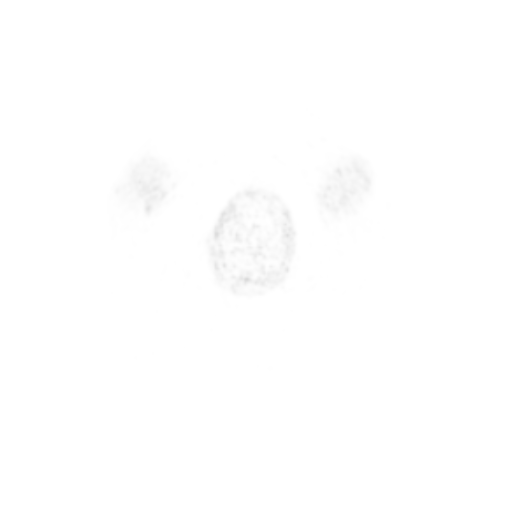
[im 109/327]
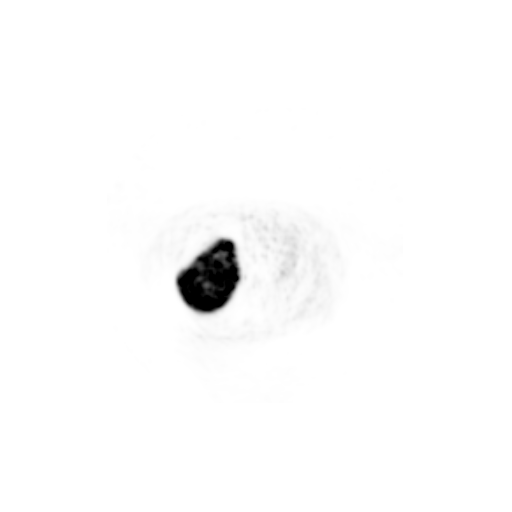
[im 218/327]
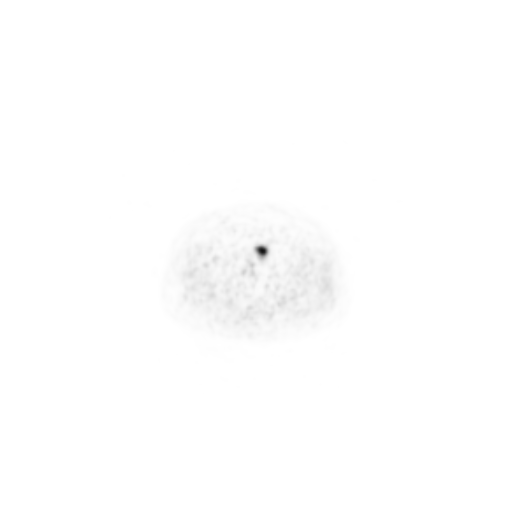
[im 327/327]
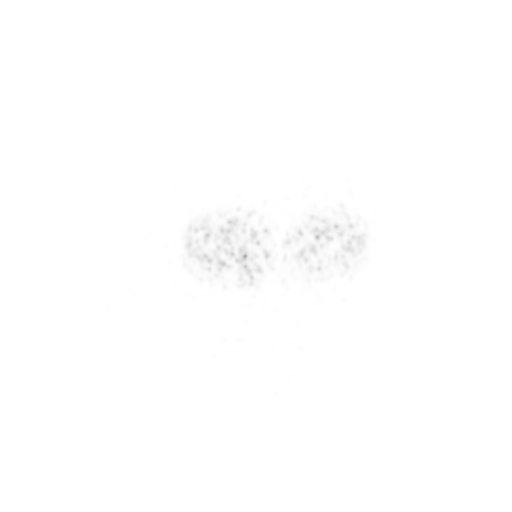

[Series 607: pet coronal · 1 of 124 slices shown]
[im 1/124]
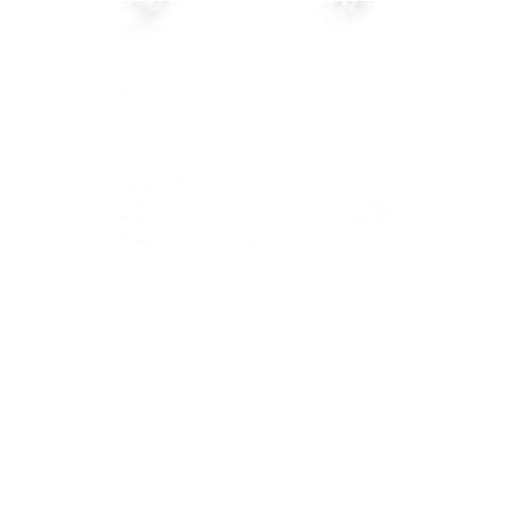

[Series 608: pet sagittal · 2 of 188 slices shown]
[im 1/188]
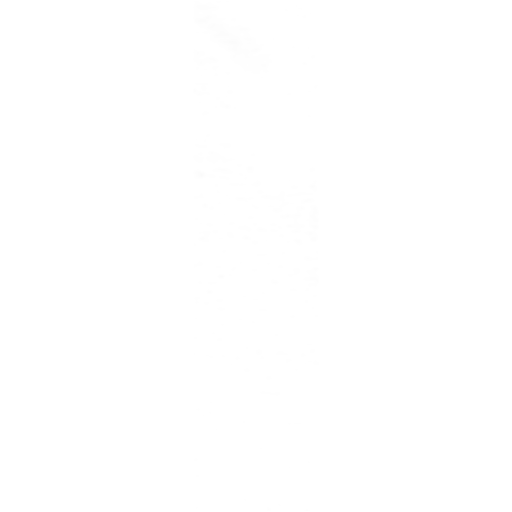
[im 188/188]
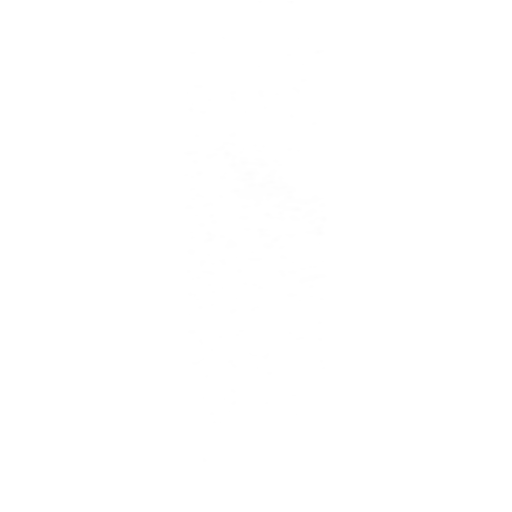

[25 of 25 positions shown; findings below may reference images not displayed]

FINDINGS: NECK

No radiotracer activity in neck lymph nodes.

Incidental CT findings: None

CHEST

No radiotracer accumulation within mediastinal or hilar lymph nodes.
No suspicious pulmonary nodules on the CT scan.

Incidental CT finding:Aortic atherosclerosis. Coronary artery
calcifications. Asymmetric elevation of the right hemidiaphragm.

ABDOMEN/PELVIS

Within the small bowel mesentery of the right hemiabdomen there is a
partially calcified mass measuring 2.2 x 1.5 cm with SUV max of
23.11, image 176/3.

Small are fine the who is at high mass we 1 the inferior IIA have CT
dose on in marked this red get ANTEL at focal area of increased
uptake localizing to a loop of small bowel within the ventral aspect
of the right hemiabdomen has an SUV max of 9.16, image 173/3. Cannot
exclude underlying small bowel lesion.

Physiologic activity noted in the liver, spleen, adrenal glands and
kidneys.

Incidental CT findings:Mild aortic atherosclerosis.

SKELETON

No abnormal foci of increased uptake.

Incidental CT findings:None
IMPRESSION: 1. Partially calcified mass within the small bowel mesentery is
tracer avid compatible with neuroendocrine receptor avid tumor.
2. Small focus of increased uptake within the ventral aspect of the
right hemiabdomen localizing to a loop of small bowel. This may
represent a small bowel lesion. Consider further investigation with
contrast enhanced CT or MRI enterography.

## 2021-09-14 MED ORDER — GALLIUM GA 68 DOTATATE IV KIT
5.8000 | PACK | Freq: Once | INTRAVENOUS | Status: AC | PRN
  Administered 2021-09-14: 5.8 via INTRAVENOUS

## 2021-09-16 ENCOUNTER — Inpatient Hospital Stay: Payer: Medicare HMO | Attending: Internal Medicine | Admitting: Internal Medicine

## 2021-09-16 ENCOUNTER — Other Ambulatory Visit: Payer: Self-pay

## 2021-09-16 DIAGNOSIS — Z86010 Personal history of colonic polyps: Secondary | ICD-10-CM | POA: Insufficient documentation

## 2021-09-16 DIAGNOSIS — Z9049 Acquired absence of other specified parts of digestive tract: Secondary | ICD-10-CM | POA: Diagnosis not present

## 2021-09-16 DIAGNOSIS — D3A098 Benign carcinoid tumors of other sites: Secondary | ICD-10-CM | POA: Insufficient documentation

## 2021-09-16 DIAGNOSIS — R630 Anorexia: Secondary | ICD-10-CM | POA: Diagnosis not present

## 2021-09-16 DIAGNOSIS — R1013 Epigastric pain: Secondary | ICD-10-CM | POA: Insufficient documentation

## 2021-09-16 DIAGNOSIS — R110 Nausea: Secondary | ICD-10-CM | POA: Insufficient documentation

## 2021-09-16 DIAGNOSIS — E7800 Pure hypercholesterolemia, unspecified: Secondary | ICD-10-CM | POA: Insufficient documentation

## 2021-09-16 DIAGNOSIS — K6389 Other specified diseases of intestine: Secondary | ICD-10-CM

## 2021-09-16 DIAGNOSIS — Z8249 Family history of ischemic heart disease and other diseases of the circulatory system: Secondary | ICD-10-CM | POA: Diagnosis not present

## 2021-09-16 DIAGNOSIS — I2510 Atherosclerotic heart disease of native coronary artery without angina pectoris: Secondary | ICD-10-CM | POA: Insufficient documentation

## 2021-09-16 DIAGNOSIS — I10 Essential (primary) hypertension: Secondary | ICD-10-CM | POA: Diagnosis not present

## 2021-09-16 DIAGNOSIS — Z823 Family history of stroke: Secondary | ICD-10-CM | POA: Insufficient documentation

## 2021-09-16 DIAGNOSIS — Z8379 Family history of other diseases of the digestive system: Secondary | ICD-10-CM | POA: Diagnosis not present

## 2021-09-16 DIAGNOSIS — I700 Atherosclerosis of aorta: Secondary | ICD-10-CM | POA: Insufficient documentation

## 2021-09-16 DIAGNOSIS — N400 Enlarged prostate without lower urinary tract symptoms: Secondary | ICD-10-CM | POA: Diagnosis not present

## 2021-09-16 DIAGNOSIS — Z6832 Body mass index (BMI) 32.0-32.9, adult: Secondary | ICD-10-CM | POA: Diagnosis not present

## 2021-09-16 DIAGNOSIS — R1909 Other intra-abdominal and pelvic swelling, mass and lump: Secondary | ICD-10-CM | POA: Insufficient documentation

## 2021-09-16 DIAGNOSIS — Z882 Allergy status to sulfonamides status: Secondary | ICD-10-CM | POA: Insufficient documentation

## 2021-09-16 DIAGNOSIS — Z8719 Personal history of other diseases of the digestive system: Secondary | ICD-10-CM | POA: Insufficient documentation

## 2021-09-16 DIAGNOSIS — Z79899 Other long term (current) drug therapy: Secondary | ICD-10-CM | POA: Diagnosis not present

## 2021-09-16 DIAGNOSIS — F17210 Nicotine dependence, cigarettes, uncomplicated: Secondary | ICD-10-CM | POA: Diagnosis not present

## 2021-09-16 DIAGNOSIS — K5900 Constipation, unspecified: Secondary | ICD-10-CM | POA: Diagnosis not present

## 2021-09-16 DIAGNOSIS — Z886 Allergy status to analgesic agent status: Secondary | ICD-10-CM | POA: Insufficient documentation

## 2021-09-16 DIAGNOSIS — Z833 Family history of diabetes mellitus: Secondary | ICD-10-CM | POA: Insufficient documentation

## 2021-09-16 DIAGNOSIS — Z8349 Family history of other endocrine, nutritional and metabolic diseases: Secondary | ICD-10-CM | POA: Insufficient documentation

## 2021-09-16 DIAGNOSIS — R109 Unspecified abdominal pain: Secondary | ICD-10-CM | POA: Insufficient documentation

## 2021-09-16 NOTE — Progress Notes (Signed)
Patient here for Pet scan results. Pt has lost 6 lbs since the last visit. He reports that he has no appetite.  He lives by himself. He has 2 sons but both live out of state. His wife lives in assisted living due to dementia

## 2021-09-16 NOTE — Progress Notes (Signed)
[place] OFFICE PROGRESS NOTE  Patient Care Team: Ria Bush, MD as PCP - General  Cancer Staging No matching staging information was found for the patient.   Oncology History   No history exists.    MPRESSION: 1. 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 Adjacent mildly enlarged noncalcified right mesenteric node, new. 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 DOTATATE PET-CT could be considered for further characterization. 2. No evidence of bowel obstruction or acute bowel inflammation. Mild scattered colonic diverticulosis, with no evidence of acute diverticulitis. 3. Mild prostatomegaly. 4. Coronary atherosclerosis. 5. Aortic Atherosclerosis (ICD10-I70   HISTORY OF PRESENT ILLNESS: Patient alone.  Ambulating independently.  Dominic Turner 75 y.o.  male pleasant patient above history of history of chronic GI/abdominal discomfort is here to review mesenteric mass./ PET scan.   Patient continues to have poor appetite.  Abdominal discomfort.  Positive for nausea no vomiting.  He is still awaiting to see his gastroenterologist.  Review of Systems  Constitutional:  Negative for chills, diaphoresis, fever, malaise/fatigue and weight loss.  HENT:  Negative for nosebleeds and sore throat.   Eyes:  Negative for double vision.  Respiratory:  Negative for cough, hemoptysis, sputum production, shortness of breath and wheezing.   Cardiovascular:  Negative for chest pain, palpitations, orthopnea and leg swelling.  Gastrointestinal:  Positive for abdominal pain, constipation and nausea. Negative for blood in stool, diarrhea, melena and vomiting.  Genitourinary:  Negative for dysuria, frequency and urgency.  Musculoskeletal:  Negative for back pain and joint pain.  Skin: Negative.  Negative for itching and rash.  Neurological:  Negative for dizziness, tingling, focal weakness, weakness and headaches.   Endo/Heme/Allergies:  Does not bruise/bleed easily.  Psychiatric/Behavioral:  Negative for depression. The patient is not nervous/anxious and does not have insomnia.      PAST MEDICAL HISTORY :  Past Medical History:  Diagnosis Date  . Acute cholecystitis 12/2015  . ANXIETY DEPRESSION 10/23/2008  . 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 Bilateral high frequency sensorineural hearing loss    rec hearing aides by ENT  . 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 Post bronchodilator test not improved.  . Diverticulosis 02/2013   by colonoscopy  . Essential hypertension, benign   . History of adenomatous polyp of colon 02/2013   rec rpt 3 yrs  . Overweight(278.02)   . 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 Pure hypercholesterolemia   . Tobacco use disorder   . Unspecified gastritis and gastroduodenitis without mention of hemorrhage   . Unspecified sleep apnea     PAST SURGICAL HISTORY :   Past Surgical History:  Procedure Laterality Date  . CATARACT EXTRACTION, BILATERAL Bilateral   . CHOLECYSTECTOMY N/A 12/30/2015   Procedure: LAPAROSCOPIC CHOLECYSTECTOMY;  Surgeon: Florene Glen, MD;  Location: [facility] ORS;  Service: General;  Laterality: N/A;  . COLONOSCOPY  03/03/2013   tubular adenoma x3, diverticulosis, rpt 3 yrs (Dr. Cristina Gong)  . COLONOSCOPY  05/2016   TA x3, diverticulosis, single angiodysplastic lesion, rpt 3 yrs (Buccini)  . COLONOSCOPY  09/2019   TA, HP, rpt 5 yrs (Buccini)    FAMILY HISTORY :   Family History  Problem Relation Age of Onset  . Cirrhosis Father        died in 76's  . Hypertension Mother   . Heart attack Sister   .  Stroke Sister   . Heart attack Maternal Grandmother   . Stroke Maternal Grandmother   . Thyroid disease Sister   . Diabetes Sister   . Cancer Neg Hx     SOCIAL HISTORY:   Social History   Tobacco Use  . Smoking status: Every Day     Packs/day: 0.75    Years: 56.00    Pack years: 42.00    Types: Cigarettes  . Smokeless tobacco: Never  Vaping Use  . Vaping Use: Never used  Substance Use Topics  . Alcohol use: No    Alcohol/week: 0.0 standard drinks  . Drug use: No    ALLERGIES:  is allergic to aspirin, sertraline hcl, sulfonamide derivatives, and wellbutrin [bupropion].  MEDICATIONS:  Current Outpatient Medications  Medication Sig Dispense Refill  . acetaminophen (TYLENOL) 500 MG tablet Take 500 mg by mouth every 6 (six) hours as needed.    Marland Kitchen albuterol (PROVENTIL HFA;VENTOLIN HFA) 108 (90 Base) MCG/ACT inhaler Take 2 puffs in AM & PM every day for next 5 days, & also can use every 4-6 hours as needed. After 5 days, use only every 4-6 hrs as needed 1 Inhaler 0  . amLODipine (NORVASC) 10 MG tablet Take 1 tablet (10 mg total) by mouth daily. 90 tablet 3  . azelastine (ASTELIN) 0.1 % nasal spray Place 1 spray into both nostrils 2 (two) times daily. Use in each nostril as directed 30 mL 11  . clobetasol (TEMOVATE) 0.05 % external solution Apply topically.    Mariane Baumgarten Calcium (STOOL SOFTENER PO) Take 1 tablet by mouth daily.    . fluticasone (CUTIVATE) 0.05 % cream Apply topically. Apply to affected areas on face up to twice a day as needed    . fluticasone (FLONASE) 50 MCG/ACT nasal spray USE 2 SPRAYS IN BOTH  NOSTRILS DAILY 48 g 3  . guaiFENesin-codeine (ROBITUSSIN AC) 100-10 MG/5ML syrup Take 5 mLs by mouth 3 (three) times daily as needed for cough or congestion (sedation precautions). 100 mL 0  . loratadine (CLARITIN) 10 MG tablet Take 1 tablet (10 mg total) by mouth daily.    . Multiple Vitamin Essential TABS Take 1 tablet by mouth daily.    Marland Kitchen omeprazole (PRILOSEC) 20 MG capsule Take 1 capsule (20 mg total) by mouth daily. For 3 weeks then as needed 30 capsule 1  . ondansetron (ZOFRAN-ODT) 4 MG disintegrating tablet Take 1 tablet (4 mg total) by mouth every 8 (eight) hours as needed for nausea or vomiting. 20  tablet 0  . polyethylene glycol powder (GLYCOLAX/MIRALAX) 17 GM/SCOOP powder Take 17 g by mouth daily. 3350 g 1  . pravastatin (PRAVACHOL) 40 MG tablet Take 1 tablet (40 mg total) by mouth daily. 90 tablet 3  . tamsulosin (FLOMAX) 0.4 MG CAPS capsule Take 1 capsule (0.4 mg total) by mouth daily. 90 capsule 3  . tiotropium (SPIRIVA HANDIHALER) 18 MCG inhalation capsule INHALE THE CONTENTS OF 1  CAPSULE BY MOUTH VIA  HANDIHALER DAILY AT BEDTIME 90 capsule 3  . traZODone (DESYREL) 50 MG tablet TAKE ONE AND ONE-HALF TABLETS BY MOUTH AT BEDTIME AS NEEDED     Current Facility-Administered Medications  Medication Dose Route Frequency Provider Last Rate Last Admin  . albuterol (PROVENTIL) (2.5 MG/3ML) 0.083% nebulizer solution 2.5 mg  2.5 mg Nebulization Once Guse, Jacquelynn Cree, FNP      . betamethasone acetate-betamethasone sodium phosphate (CELESTONE) injection 3 mg  3 mg Intramuscular Once Edrick Kins, DPM  PHYSICAL EXAMINATION: ECOG PERFORMANCE STATUS: 0 - Asymptomatic  BP 140/76   Pulse 83   Temp 99.8 F (37.7 C) (Tympanic)   Resp 20   Ht 5\' 9"  (1.753 m)   Wt 220 lb (99.8 kg)   BMI 32.49 kg/m   Filed Weights   09/16/21 1450  Weight: 220 lb (99.8 kg)    Physical Exam Vitals and nursing note reviewed.  HENT:     Head: Normocephalic and atraumatic.     Mouth/Throat:     Pharynx: Oropharynx is clear.  Eyes:     Extraocular Movements: Extraocular movements intact.     Pupils: Pupils are equal, round, and reactive to light.  Cardiovascular:     Rate and Rhythm: Normal rate and regular rhythm.  Pulmonary:     Comments: Decreased breath sounds bilaterally.  Abdominal:     Palpations: Abdomen is soft.  Musculoskeletal:        General: Normal range of motion.     Cervical back: Normal range of motion.  Skin:    General: Skin is warm.  Neurological:     General: No focal deficit present.     Mental Status: He is alert and oriented to person, place, and time.  Psychiatric:         Behavior: Behavior normal.        Judgment: Judgment normal.       LABORATORY DATA:  I have reviewed the data as listed    Component Value Date/Time   NA 137 09/09/2021 1430   K 3.4 (L) 09/09/2021 1430   CL 101 09/09/2021 1430   CO2 28 09/09/2021 1430   GLUCOSE 103 (H) 09/09/2021 1430   BUN 8 09/09/2021 1430   CREATININE 1.14 09/09/2021 1430   CALCIUM 9.4 09/09/2021 1430   PROT 6.9 09/09/2021 1430   ALBUMIN 4.1 09/09/2021 1430   AST 18 09/09/2021 1430   ALT 14 09/09/2021 1430   ALKPHOS 102 09/09/2021 1430   BILITOT 0.8 09/09/2021 1430   GFRNONAA >60 09/09/2021 1430   GFRAA >60 03/30/2020 0958    No results found for: SPEP, UPEP  Lab Results  Component Value Date   WBC 6.0 09/09/2021   NEUTROABS 3.1 09/09/2021   HGB 15.7 09/09/2021   HCT 47.6 09/09/2021   MCV 75.2 (L) 09/09/2021   PLT 194 09/09/2021      Chemistry      Component Value Date/Time   NA 137 09/09/2021 1430   K 3.4 (L) 09/09/2021 1430   CL 101 09/09/2021 1430   CO2 28 09/09/2021 1430   BUN 8 09/09/2021 1430   CREATININE 1.14 09/09/2021 1430      Component Value Date/Time   CALCIUM 9.4 09/09/2021 1430   ALKPHOS 102 09/09/2021 1430   AST 18 09/09/2021 1430   ALT 14 09/09/2021 1430   BILITOT 0.8 09/09/2021 1430       RADIOGRAPHIC STUDIES: I have personally reviewed the radiological images as listed and agreed with the findings in the report. No results found.   ASSESSMENT & PLAN:  Mesenteric mass     Carcinoid tumor of intestine #Mesenteric mass- heterogeneously coarsely calcified 2.7 x 1.5 cm right mesenteric mass, slightly increased. Adjacent mildly enlarged noncalcified right mesenteric node, new.  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 . Small focus of increased uptake within the ventral aspect of the right hemiabdomen localizing to a  loop  of small bowel. This may represent a small bowel lesion.   #I reviewed the natural history of carcinoids-slow-growing tumors; however unfortunately malignancy stage IV.  In general patients with carcinoid liver for years.  Patient has nonfunctioning carcinoid-and would not expect to cause any symptoms [see below].  However, Sandostatin could be recommended-to prevent any further growth of disease.  Question surgical resection-tumor conference.  Will discuss  # Dyspepsia- Bloating NOt on improved on Gasex/ Probiotics [previously improved]- Dr.Buccini.  Recommend further evaluation with GI with upper endoscopy-as above carcinoid should not cause any symptoms.  # Smoking: Again reviewed with the patient the ill effects of smoking- including but not limited to cardiac lung and vascular diseases and malignancies.  I spoke at length with the patient's son, Aaron Edelman regarding the patient's clinical status/plan of care.  Family agreement.   # DISPOSITION: # follow up in 6 weeks; MD; labs- cbc/cmp; iron studies.ferritin-Sandostatin- Dr.B  # I reviewed the blood work- with the patient in detail; also reviewed the imaging independently [as summarized above]; and with the patient in detail.     Orders Placed This Encounter  Procedures  . CBC with Differential    Standing Status:   Future    Standing Expiration Date:   09/16/2022  . Comprehensive metabolic panel    Standing Status:   Future    Standing Expiration Date:   09/16/2022  . Ferritin    Standing Status:   Future    Standing Expiration Date:   09/16/2022  . Iron and TIBC    Standing Status:   Future    Standing Expiration Date:   09/16/2022    All questions were answered. The patient knows to call the clinic with any problems, questions or concerns.      Cammie Sickle, MD 09/16/2021 4:03 PM

## 2021-09-16 NOTE — Assessment & Plan Note (Signed)
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 Adjacent mildly enlarged noncalcified right mesenteric node, new.  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 . Small focus of increased uptake within the ventral aspect of the right hemiabdomen localizing to a loop of small bowel. This may represent a small bowel lesion.   #I reviewed the natural history of carcinoids-slow-growing tumors; however unfortunately malignancy stage IV.  In general patients with carcinoid liver for years.  Patient has nonfunctioning carcinoid-and would not expect to cause any symptoms [see below].  However, Sandostatin could be recommended-to prevent any further growth of disease.  Question surgical resection-tumor conference.  Will discuss  # Dyspepsia- Bloating NOt on improved on Gasex/ Probiotics [previously improved]- Dr.Buccini.  Recommend further evaluation with GI with upper endoscopy-as above carcinoid should not cause any symptoms.  # Smoking: Again reviewed with the patient the ill effects of smoking- including but not limited to cardiac lung and vascular diseases and malignancies.  I spoke at length with the patient's son, Dominic Turner regarding the patient's clinical status/plan of care.  Family agreement.   # DISPOSITION: # follow up in 6 weeks; MD; labs- cbc/cmp; iron studies.ferritin-Sandostatin- Dr.B  # I reviewed the blood work- with the patient in detail; also reviewed the imaging independently [as summarized above]; and with the patient in detail.

## 2021-09-19 ENCOUNTER — Telehealth: Payer: Self-pay | Admitting: *Deleted

## 2021-09-19 ENCOUNTER — Other Ambulatory Visit: Payer: Self-pay | Admitting: *Deleted

## 2021-09-19 DIAGNOSIS — K6389 Other specified diseases of intestine: Secondary | ICD-10-CM

## 2021-09-19 DIAGNOSIS — R1013 Epigastric pain: Secondary | ICD-10-CM

## 2021-09-19 NOTE — Telephone Encounter (Signed)
Spoke with patient- He prefers to see Dr. Vicente Males. Referral placed to work up Dyspepsia. Needs upper endoscopy. H/o mesenteric mass.

## 2021-09-19 NOTE — Telephone Encounter (Signed)
Patient called stating that Dr B wants him to have a GI appointment to be scoped. He states his GI doctor has retired and there is a NP is his place and so he is asking to be referred to a GI doctor

## 2021-09-22 ENCOUNTER — Other Ambulatory Visit: Payer: Medicare HMO

## 2021-09-22 ENCOUNTER — Telehealth: Payer: Self-pay | Admitting: *Deleted

## 2021-09-22 NOTE — Telephone Encounter (Signed)
Patient called asking for a letter address to [facility] stating that he has stomach cancer. He said to call him when done and he will come pick it up

## 2021-09-22 NOTE — Telephone Encounter (Signed)
Dr. Brahmanday- please advise. 

## 2021-09-22 NOTE — Progress Notes (Signed)
Tumor Board Documentation  Dominic Turner was presented by Dr Rogue Bussing at our Tumor Board on 09/22/2021, which included representatives from medical oncology, radiation oncology, surgical, radiology, pathology, navigation, internal medicine, research, genetics, pulmonology, pharmacy.  Dominic Turner currently presents for discussion, as a new patient with history of the following treatments: none.  Additionally, we reviewed previous medical and familial history, history of present illness, and recent lab results along with all available histopathologic and imaging studies. The tumor board considered available treatment options and made the following recommendations:   Upper Endoscopy, Possible  Sandostatin vs Observation  The following procedures/referrals were also placed: No orders of the defined types were placed in this encounter.   Clinical Trial Status: not discussed   Staging used: Clinical Stage AJCC Staging:       Group: Clinical stage IV Mesenteric Carcinoid   National site-specific guidelines   were discussed with respect to the case.  Tumor board is a meeting of clinicians from various specialty areas who evaluate and discuss patients for whom a multidisciplinary approach is being considered. Final determinations in the plan of care are those of the provider(s). The responsibility for follow up of recommendations given during tumor board is that of the provider.   Today's extended care, comprehensive team conference, Dominic Turner was not present for the discussion and was not examined.   Multidisciplinary Tumor Board is a multidisciplinary case peer review process.  Decisions discussed in the Multidisciplinary Tumor Board reflect the opinions of the specialists present at the conference without having examined the patient.  Ultimately, treatment and diagnostic decisions rest with the primary provider(s) and the patient.

## 2021-09-23 ENCOUNTER — Encounter: Payer: Self-pay | Admitting: *Deleted

## 2021-09-23 NOTE — Telephone Encounter (Signed)
Letter written - pending md signature on letter 

## 2021-09-25 LAB — 5 HIAA, QUANTITATIVE, URINE, 24 HOUR
5-HIAA, Ur: 2.5 mg/L
5-HIAA,Quant.,24 Hr Urine: 1.8 mg/24 hr (ref 0.0–14.9)
Total Volume: 700

## 2021-09-27 NOTE — Telephone Encounter (Signed)
Letter written and signed. Duwayne Heck was to call patient on 10/17 to let him know that letter was ready to be picked up.

## 2021-09-28 ENCOUNTER — Ambulatory Visit: Payer: Medicare HMO | Admitting: Gastroenterology

## 2021-09-28 ENCOUNTER — Other Ambulatory Visit: Payer: Self-pay

## 2021-09-28 ENCOUNTER — Encounter: Payer: Self-pay | Admitting: Gastroenterology

## 2021-09-28 VITALS — BP 150/77 | HR 88 | Temp 98.3°F | Ht 69.0 in | Wt 219.0 lb

## 2021-09-28 DIAGNOSIS — F458 Other somatoform disorders: Secondary | ICD-10-CM | POA: Diagnosis not present

## 2021-09-28 DIAGNOSIS — R1084 Generalized abdominal pain: Secondary | ICD-10-CM

## 2021-09-28 DIAGNOSIS — K6389 Other specified diseases of intestine: Secondary | ICD-10-CM

## 2021-09-28 DIAGNOSIS — R140 Abdominal distension (gaseous): Secondary | ICD-10-CM

## 2021-09-28 DIAGNOSIS — R1013 Epigastric pain: Secondary | ICD-10-CM

## 2021-09-28 DIAGNOSIS — Z86010 Personal history of colonic polyps: Secondary | ICD-10-CM

## 2021-09-28 DIAGNOSIS — K5909 Other constipation: Secondary | ICD-10-CM | POA: Diagnosis not present

## 2021-09-28 MED ORDER — NA SULFATE-K SULFATE-MG SULF 17.5-3.13-1.6 GM/177ML PO SOLN: 354.0000 mL | Freq: Once | ORAL | 0 refills | Status: AC

## 2021-09-28 NOTE — Patient Instructions (Signed)
Low-FODMAP Eating Plan °FODMAP stands for fermentable oligosaccharides, disaccharides, monosaccharides, and polyols. These are sugars that are hard for some people to digest. A low-FODMAP eating plan may help some people who have irritable bowel syndrome (IBS) and certain other bowel (intestinal) diseases to manage their symptoms. °This meal plan can be complicated to follow. Work with a diet and nutrition specialist (dietitian) to make a low-FODMAP eating plan that is right for you. A dietitian can help make sure that you get enough nutrition from this diet. °What are tips for following this plan? °Reading food labels °Check labels for hidden FODMAPs such as: °High-fructose syrup. °Honey. °Agave. °Natural fruit flavors. °Onion or garlic powder. °Choose low-FODMAP foods that contain 3-4 grams of fiber per serving. °Check food labels for serving sizes. Eat only one serving at a time to make sure FODMAP levels stay low. °Shopping °Shop with a list of foods that are recommended on this diet and make a meal plan. °Meal planning °Follow a low-FODMAP eating plan for up to 6 weeks, or as told by your health care provider or dietitian. °To follow the eating plan: °Eliminate high-FODMAP foods from your diet completely. Choose only low-FODMAP foods to eat. You will do this for 2-6 weeks. °Gradually reintroduce high-FODMAP foods into your diet one at a time. Most people should wait a few days before introducing the next new high-FODMAP food into their meal plan. Your dietitian can recommend how quickly you may reintroduce foods. °Keep a daily record of what and how much you eat and drink. Make note of any symptoms that you have after eating. °Review your daily record with a dietitian regularly to identify which foods you can eat and which foods you should avoid. °General tips °Drink enough fluid each day to keep your urine pale yellow. °Avoid processed foods. These often have added sugar and may be high in FODMAPs. °Avoid most  dairy products, whole grains, and sweeteners. °Work with a dietitian to make sure you get enough fiber in your diet. °Avoid high FODMAP foods at meals to manage symptoms. °Recommended foods °Fruits °Bananas, oranges, tangerines, lemons, limes, blueberries, raspberries, strawberries, grapes, cantaloupe, honeydew melon, kiwi, papaya, passion fruit, and pineapple. Limited amounts of dried cranberries, banana chips, and shredded coconut. °Vegetables °Eggplant, zucchini, cucumber, peppers, green beans, bean sprouts, lettuce, arugula, kale, Swiss chard, spinach, collard greens, bok choy, summer squash, potato, and tomato. Limited amounts of corn, carrot, and sweet potato. Green parts of scallions. °Grains °Gluten-free grains, such as rice, oats, buckwheat, quinoa, corn, polenta, and millet. Gluten-free pasta, bread, or cereal. Rice noodles. Corn tortillas. °Meats and other proteins °Unseasoned beef, pork, poultry, or fish. Eggs. Bacon. Tofu (firm) and tempeh. Limited amounts of nuts and seeds, such as almonds, walnuts, brazil nuts, pecans, peanuts, nut butters, pumpkin seeds, chia seeds, and sunflower seeds. °Dairy °Lactose-free milk, yogurt, and kefir. Lactose-free cottage cheese and ice cream. Non-dairy milks, such as almond, coconut, hemp, and rice milk. Non-dairy yogurt. Limited amounts of goat cheese, brie, mozzarella, parmesan, swiss, and other hard cheeses. °Fats and oils °Butter-free spreads. Vegetable oils, such as olive, canola, and sunflower oil. °Seasoning and other foods °Artificial sweeteners with names that do not end in "ol," such as aspartame, saccharine, and stevia. Maple syrup, white table sugar, raw sugar, brown sugar, and molasses. Mayonnaise, soy sauce, and tamari. Fresh basil, coriander, parsley, rosemary, and thyme. °Beverages °Water and mineral water. Sugar-sweetened soft drinks. Small amounts of orange juice or cranberry juice. Black and green tea. Most dry wines. Coffee. °  The items listed above  may not be a complete list of foods and beverages you can eat. Contact a dietitian for more information. °Foods to avoid °Fruits °Fresh, dried, and juiced forms of apple, pear, watermelon, peach, plum, cherries, apricots, blackberries, boysenberries, figs, nectarines, and mango. Avocado. °Vegetables °Chicory root, artichoke, asparagus, cabbage, snow peas, Brussels sprouts, broccoli, sugar snap peas, mushrooms, celery, and cauliflower. Onions, garlic, leeks, and the white part of scallions. °Grains °Wheat, including kamut, durum, and semolina. Barley and bulgur. Couscous. Wheat-based cereals. Wheat noodles, bread, crackers, and pastries. °Meats and other proteins °Fried or fatty meat. Sausage. Cashews and pistachios. Soybeans, baked beans, black beans, chickpeas, kidney beans, fava beans, navy beans, lentils, black-eyed peas, and split peas. °Dairy °Milk, yogurt, ice cream, and soft cheese. Cream and sour cream. Milk-based sauces. Custard. Buttermilk. Soy milk. °Seasoning and other foods °Any sugar-free gum or candy. Foods that contain artificial sweeteners such as sorbitol, mannitol, isomalt, or xylitol. Foods that contain honey, high-fructose corn syrup, or agave. Bouillon, vegetable stock, beef stock, and chicken stock. Garlic and onion powder. Condiments made with onion, such as hummus, chutney, pickles, relish, salad dressing, and salsa. Tomato paste. °Beverages °Chicory-based drinks. Coffee substitutes. Chamomile tea. Fennel tea. Sweet or fortified wines such as port or sherry. Diet soft drinks made with isomalt, mannitol, maltitol, sorbitol, or xylitol. Apple, pear, and mango juice. Juices with high-fructose corn syrup. °The items listed above may not be a complete list of foods and beverages you should avoid. Contact a dietitian for more information. °Summary °FODMAP stands for fermentable oligosaccharides, disaccharides, monosaccharides, and polyols. These are sugars that are hard for some people to  digest. °A low-FODMAP eating plan is a short-term diet that helps to ease symptoms of certain bowel diseases. °The eating plan usually lasts up to 6 weeks. After that, high-FODMAP foods are reintroduced gradually and one at a time. This can help you find out which foods may be causing symptoms. °A low-FODMAP eating plan can be complicated. It is best to work with a dietitian who has experience with this type of plan. °This information is not intended to replace advice given to you by your health care provider. Make sure you discuss any questions you have with your health care provider. °Document Revised: 04/15/2020 Document Reviewed: 04/15/2020 °Elsevier Patient Education © 2022 [employer] ° °

## 2021-09-28 NOTE — Progress Notes (Signed)
Dominic Bellows MD, MRCP(U.K) [address]  [place], [place] [address]  Main: [phone]  Fax: [phone]   Gastroenterology Consultation  Referring Provider: Dr. Rogue Bussing Primary Care Physician:  Dominic Bush, MD Primary Gastroenterologist:  Dr. Jonathon Turner  Reason for Consultation:     Mesenteric mass and dyspepsia        HPI:   Dominic Turner is a 75 y.o. y/o male referred for dyspepsia and mesenteric mass.  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  09/01/2021 CT abdomen pelvis with contrast shows mesenteric mass.  Colonic diverticulosis. 09/14/2021 PET scan shows calcified mass within the small bowel mesentery compatible neuroendocrine tumor.  Small uptake in the ventral aspect of the right hemiabdomen localizing to a loop of small bowel which may represent a small bowel lesion.  09/01/2021 CMP normal.  Hemoglobin 15.9 g with an MCV of 73.  History of thalassemia. 05/30/2016 colonoscopy.  Small polyps resected.  Tubular adenoma x3  He states that he has not a lot of gas and bloating usually in the left side of his abdomen after his dinner which wakes him up from sleep.  He smokes half a pack of cigarettes a day.  Consumes artificial sugars and sweeteners in his diet.  No probiotics at this point of time.  He has a bowel movement every other day only when he takes an OTC agent.  Denies it being very hot.  The bloating is better after a good bowel movement.  No NSAID use.  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 Post bronchodilator test not improved.   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 Cristina Gong)   COLONOSCOPY  05/2016   TA x3, diverticulosis, single angiodysplastic lesion, rpt 3 yrs (Buccini)   COLONOSCOPY  09/2019   TA, HP, rpt 5 yrs (Buccini)    Prior to Admission medications   Medication Sig Start Date End Date Taking? Authorizing Provider  acetaminophen (TYLENOL) 500 MG tablet Take 500 mg by mouth every 6 (six) hours as needed.    [provider]  albuterol (PROVENTIL HFA;VENTOLIN HFA) 108 (90 Base) MCG/ACT inhaler Take 2 puffs in AM & PM every day for next 5 days, & also can use every 4-6  hours as needed. After 5 days, use only every 4-6 hrs as needed 10/31/18   Jodelle Green, FNP  amLODipine (NORVASC) 10 MG tablet Take 1 tablet (10 mg total) by mouth daily. 02/18/21   Dominic Bush, MD  azelastine (ASTELIN) 0.1 % nasal spray Place 1 spray into both nostrils 2 (two) times daily. Use in each nostril as directed 02/18/21   Dominic Bush, MD  clobetasol (TEMOVATE) 0.05 % external solution Apply topically. 12/25/19   [provider]  Docusate Calcium (STOOL SOFTENER PO) Take 1 tablet by mouth daily.    [provider]   fluticasone (CUTIVATE) 0.05 % cream Apply topically. Apply to affected areas on face up to twice a day as needed 11/11/19   [provider]  fluticasone (FLONASE) 50 MCG/ACT nasal spray USE 2 SPRAYS IN BOTH  NOSTRILS DAILY 12/24/20   Dominic Bush, MD  guaiFENesin-codeine [facility]) 100-10 MG/5ML syrup Take 5 mLs by mouth 3 (three) times daily as needed for cough or congestion (sedation precautions). 12/09/20   Dominic Bush, MD  loratadine (CLARITIN) 10 MG tablet Take 1 tablet (10 mg total) by mouth daily. 07/22/19   Dominic Bush, MD  Multiple Vitamin Essential TABS Take 1 tablet by mouth daily.    [provider]  omeprazole (PRILOSEC) 20 MG capsule Take 1 capsule (20 mg total) by mouth daily. For 3 weeks then as needed 09/09/21   Dominic Bush, MD  ondansetron (ZOFRAN-ODT) 4 MG disintegrating tablet Take 1 tablet (4 mg total) by mouth every 8 (eight) hours as needed for nausea or vomiting. 09/01/21   Triplett, Cari B, FNP  polyethylene glycol powder (GLYCOLAX/MIRALAX) 17 GM/SCOOP powder Take 17 g by mouth daily. 07/22/19   Dominic Bush, MD  pravastatin (PRAVACHOL) 40 MG tablet Take 1 tablet (40 mg total) by mouth daily. 02/18/21   Dominic Bush, MD  tamsulosin (FLOMAX) 0.4 MG CAPS capsule Take 1 capsule (0.4 mg total) by mouth daily. 02/18/21   Dominic Bush, MD  tiotropium (SPIRIVA HANDIHALER) 18 MCG inhalation capsule INHALE THE CONTENTS OF 1  CAPSULE BY MOUTH VIA  HANDIHALER DAILY AT BEDTIME 02/18/21   Dominic Bush, MD  traZODone (DESYREL) 50 MG tablet TAKE ONE AND ONE-HALF TABLETS BY MOUTH AT BEDTIME AS NEEDED 08/03/21   [provider]    Family History  Problem Relation Age of Onset   Cirrhosis Father        died in 37's   Hypertension Mother    Heart attack Sister    Stroke Sister    Heart attack Maternal Grandmother    Stroke Maternal Grandmother    Thyroid disease Sister    Diabetes Sister    Cancer Neg Hx      Social  History   Tobacco Use   Smoking status: Every Day    Packs/day: 0.75    Years: 56.00    Pack years: 42.00    Types: Cigarettes   Smokeless tobacco: Never  Vaping Use   Vaping Use: Never used  Substance Use Topics   Alcohol use: No    Alcohol/week: 0.0 standard drinks   Drug use: No    Allergies as of 09/28/2021 - Review Complete 09/28/2021  Allergen Reaction Noted   Aspirin Other (See Comments)    Sertraline hcl Other (See Comments) 11/27/2008   Sulfonamide derivatives Other (See Comments)    Wellbutrin [bupropion] Rash 04/09/2014    Review of Systems:    All systems reviewed and negative except where noted in  HPI.   Physical Exam:  BP (!) 150/77   Pulse 88   Temp 98.3 F (36.8 C) (Oral)   Ht 5\' 9"  (1.753 m)   Wt 219 lb (99.3 kg)   BMI 32.34 kg/m  No LMP for male patient. Psych:  Alert and cooperative. Normal mood and affect. General:   Alert,  Well-developed, well-nourished, pleasant and cooperative in NAD Head:  Normocephalic and atraumatic. Eyes:  Sclera clear, no icterus.   Conjunctiva pink. Ears:  Normal auditory acuity. Lungs:  Respirations even and unlabored.  Clear throughout to auscultation.   No wheezes, crackles, or rhonchi. No acute distress. Heart:  Regular rate and rhythm; no murmurs, clicks, rubs, or gallops. Abdomen:  Normal bowel sounds.  No bruits.  Soft, non-tender and non-distended without masses, hepatosplenomegaly or hernias noted.  No guarding or rebound tenderness.    Neurologic:  Alert and oriented x3;  grossly normal neurologically. Psych:  Alert and cooperative. Normal mood and affect.  Imaging Studies: CT ABDOMEN PELVIS W CONTRAST  Result Date: 09/01/2021 CLINICAL DATA:  Abdominal pain, acute, nonlocalized. Dizziness with nausea. EXAM: CT ABDOMEN AND PELVIS WITH CONTRAST TECHNIQUE: Multidetector CT imaging of the abdomen and pelvis was performed using the standard protocol following bolus administration of intravenous contrast. CONTRAST:   130mL OMNIPAQUE IOHEXOL 350 MG/ML SOLN COMPARISON:  03/30/2020 CT abdomen/pelvis. FINDINGS: Lower chest: Moderate eventration of the right hemidiaphragm with associated passive right lung base atelectasis, unchanged. Coronary atherosclerosis. Hepatobiliary: Normal liver size. A few scattered benign-appearing liver cysts, largest 3.1 cm in the lateral segment left liver lobe with thin internal septation. Additional scattered subcentimeter hypodense liver lesions are too small to characterize and appear unchanged, presumably benign. No new or suspicious liver masses. Cholecystectomy. No biliary ductal dilatation. Pancreas: Normal, with no mass or duct dilation. Spleen: Normal size. No mass. Adrenals/Urinary Tract: Normal adrenals. Normal kidneys with no hydronephrosis and no renal mass. Normal bladder. Stomach/Bowel: Normal non-distended stomach. Normal caliber small bowel with no small bowel wall thickening. Normal appendix. Mild scattered colonic diverticulosis with no large bowel wall thickening or significant pericolonic fat stranding. Vascular/Lymphatic: Atherosclerotic nonaneurysmal abdominal aorta. Patent portal, splenic, hepatic and renal veins. Heterogeneously coarsely calcified 2.7 x 1.5 cm right mesenteric mass (series 2/image 55), previously 2.2 x 1.5 cm, slightly increased. Adjacent mildly enlarged right mesenteric noncalcified 1.9 x 1.0 cm node (series 2/image 53), new. No additional pathologically enlarged lymph nodes in the abdomen or pelvis. Reproductive: Mild prostatomegaly. Nonspecific coarse internal prostatic calcifications. Other: No pneumoperitoneum, ascites or focal fluid collection. Musculoskeletal: No aggressive appearing focal osseous lesions. Moderate thoracolumbar spondylosis. IMPRESSION: 1. 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 Adjacent mildly enlarged noncalcified right mesenteric node, new. 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  DOTATATE PET-CT could be considered for further characterization. 2. No evidence of bowel obstruction or acute bowel inflammation. Mild scattered colonic diverticulosis, with no evidence of acute diverticulitis. 3. Mild prostatomegaly. 4. Coronary atherosclerosis. 5. Aortic Atherosclerosis (ICD10-I70.0). Electronically Signed   By: Ilona Sorrel M.D.   On: 09/01/2021 09:19   NM PET (NETSPOT GA 34 DOTATATE) SKULL BASE TO MID THIGH  Result Date: 09/14/2021 CLINICAL DATA:  Carcinoid disease.  Mesenteric mass. EXAM: NUCLEAR MEDICINE PET SKULL BASE TO THIGH TECHNIQUE: 5.8 mCi Ga 18 DOTATATE was injected intravenously. Full-ring PET imaging was performed from the skull base to thigh after the radiotracer. CT data was obtained and used for attenuation correction and anatomic localization. COMPARISON:  09/01/2021 FINDINGS: NECK No radiotracer  activity in neck lymph nodes. Incidental CT findings: None CHEST No radiotracer accumulation within mediastinal or hilar lymph nodes. No suspicious pulmonary nodules on the CT scan. Incidental CT finding:Aortic atherosclerosis. Coronary artery calcifications. Asymmetric elevation of the right hemidiaphragm. ABDOMEN/PELVIS Within the small bowel mesentery of the right hemiabdomen there is a partially calcified mass measuring 2.2 x 1.5 cm with SUV max of 23.11, image 176/3. Small are fine the who is at high mass we 1 the inferior IIA have CT dose on in marked this red get Adam at focal area of increased uptake localizing to a loop of small bowel within the ventral aspect of the right hemiabdomen has an SUV max of 9.16, image 173/3. Cannot exclude underlying small bowel lesion. Physiologic activity noted in the liver, spleen, adrenal glands and kidneys. Incidental CT findings:Mild aortic atherosclerosis. SKELETON No abnormal foci of increased uptake. Incidental CT findings:None IMPRESSION: 1. Partially calcified mass within the small bowel mesentery is tracer avid compatible with  neuroendocrine receptor avid tumor. 2. Small focus of increased uptake within the ventral aspect of the right hemiabdomen localizing to a loop of small bowel. This may represent a small bowel lesion. Consider further investigation with contrast enhanced CT or MRI enterography. Electronically Signed   By: Kerby Moors M.D.   On: 09/14/2021 13:05    Assessment and Plan:   Yoshimi Sarr is a 75 y.o. y/o male has recently been diagnosed with mesenteric mass.  He has been referred to see me for dyspeptic symptoms and bloating.  Recent PET scan shows concerns for a small bowel mass ?  Within the lumen of the small bowel.  History of multiple tubular adenomas in 2017 due for surveillance colonoscopy.  Likely that his bloating symptoms are secondary to consumption of artificial sugars and sweeteners in his diet along with aerophagia from smoking.  We will have to rule out any luminal obstruction of the small bowel due to the mesenteric mass which may be impeding the flow of contents.  Plan 1.  EGD and colonoscopy to be performed for dyspeptic symptoms and surveillance due to personal history of colon polyps in 2017  2.  MRI enterography to evaluate lumen of the small bowel to rule out any lesions that may be causing partial obstruction to the flow of contents and leading to bloating and dyspeptic symptoms  3.  Avoid all artificial sugars and sweeteners, stop probiotics as it can cause bloating.  Continue to take his OTC meds for constipation which seems to work but take it on a daily basis..  Low FODMAP diet patient information provided suggest to stop smoking as secondary to aerophagia and lead to worsening of bloating symptoms.  Consider activated charcoal tablets as needed, I have provided him a picture of the same to take up to 3 times a day.   I have discussed alternative options, risks & benefits,  which include, but are not limited to, bleeding, infection, perforation,respiratory complication &  drug reaction.  The patient agrees with this plan & written consent will be obtained.     Follow up in 6 to 8 weeks  Dr Dominic Bellows MD,MRCP(U.K)

## 2021-09-29 ENCOUNTER — Telehealth: Payer: Self-pay | Admitting: Gastroenterology

## 2021-09-29 MED ORDER — NA SULFATE-K SULFATE-MG SULF 17.5-3.13-1.6 GM/177ML PO SOLN: 354.0000 mL | Freq: Once | ORAL | 0 refills | Status: AC

## 2021-09-29 NOTE — Telephone Encounter (Signed)
Pt. Requesting a call back. He said Dr. Vicente Males told him to get some charcoal capsules but did not specify where to get them

## 2021-09-29 NOTE — Telephone Encounter (Signed)
Called patient back and he wanted to know if he needed a prescription for the charcoal capsules and I told him that he did not. I told him to go to his local pharmacy and ask his pharmacist to help him locate them. Patient agreed. He then asked me to send his prep to [facility] in [place], therefore, I will. Patient had no further questions.

## 2021-10-02 ENCOUNTER — Other Ambulatory Visit: Payer: Self-pay | Admitting: Family Medicine

## 2021-10-03 ENCOUNTER — Telehealth: Payer: Self-pay | Admitting: *Deleted

## 2021-10-03 NOTE — Telephone Encounter (Signed)
Patient called asking why we ordered an MRI  for him. I advised him that Dr Vicente Males ordered it not [place] and he thanked me for calling and letting him know thinking that something was going on he did not know about

## 2021-10-06 ENCOUNTER — Telehealth: Payer: Self-pay | Admitting: Gastroenterology

## 2021-10-06 NOTE — Telephone Encounter (Signed)
Pt. Came in today he was concerned that his appt that he had 10/31 was cancelled. He said no one told him that it was cancelled and he would like to reschedule.

## 2021-10-07 NOTE — Telephone Encounter (Signed)
Patient was contacted and was informed that his MRI was rescheduled sine it got approved. Patient understood and had no further questions.

## 2021-10-07 NOTE — Telephone Encounter (Addendum)
Spoke with patient for an update - omeprazole 40mg  significantly helped GI symptoms.  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  Losing weight due to no appetite. Down to 217 lbs.

## 2021-10-10 ENCOUNTER — Ambulatory Visit: Payer: Medicare HMO

## 2021-10-10 ENCOUNTER — Ambulatory Visit
Admission: RE | Admit: 2021-10-10 | Discharge: 2021-10-10 | Disposition: A | Discharge status: Home / Self Care | Payer: Medicare HMO | Source: Ambulatory Visit | Attending: Gastroenterology | Admitting: Gastroenterology

## 2021-10-10 ENCOUNTER — Other Ambulatory Visit: Payer: Self-pay

## 2021-10-10 DIAGNOSIS — R140 Abdominal distension (gaseous): Secondary | ICD-10-CM | POA: Diagnosis not present

## 2021-10-10 DIAGNOSIS — K7689 Other specified diseases of liver: Secondary | ICD-10-CM | POA: Diagnosis not present

## 2021-10-10 DIAGNOSIS — K6389 Other specified diseases of intestine: Secondary | ICD-10-CM | POA: Diagnosis not present

## 2021-10-10 DIAGNOSIS — R1909 Other intra-abdominal and pelvic swelling, mass and lump: Secondary | ICD-10-CM | POA: Diagnosis not present

## 2021-10-10 IMAGING — MR MR ENTEROGRAPHY W/ CM
17 series · 48 of 48 positions shown · IV contrast (gadavist)
Comparison: PET-CT from [DATE] asymmetric elevation

CLINICAL DATA: History of neuroendocrine tumor receptor avid tumor
within the small bowel mesentery.

EXAM:
MR ABDOMEN AND PELVIS WITHOUT AND WITH CONTRAST (MR ENTEROGRAPHY)
TECHNIQUE: Multiplanar, multisequence MRI of the abdomen and pelvis was
performed both before and during bolus administration of intravenous
contrast. Negative oral contrast VoLumen was given.
CONTRAST:  10mL GADAVIST GADOBUTROL 1 MMOL/ML IV SOLN

[Series 4: T2 · coronal · 6.0mm · 0.88mm/px · 2 of 45 slices shown (1 of 3)]
[im 1/45]
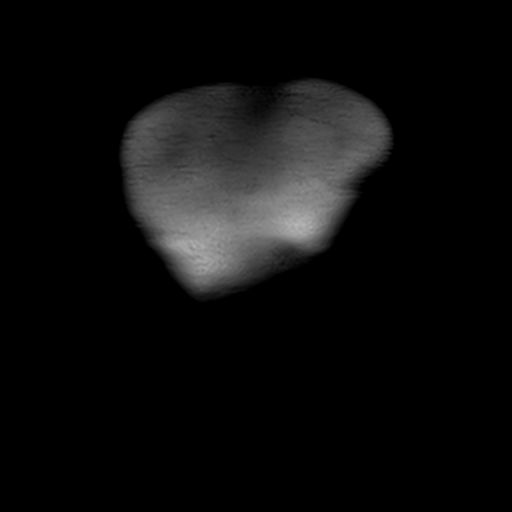
[im 45/45]
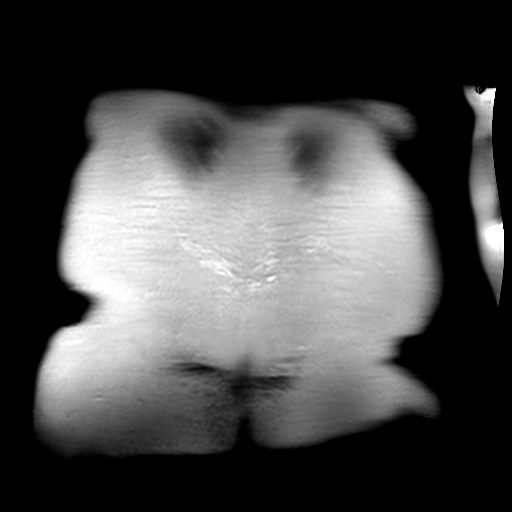

[Series 7: T2 · axial · 6.0mm · 1.25mm/px · z∈[-140,+77]mm · 2 of 32 slices shown (2 of 3)]
[im 1/32]
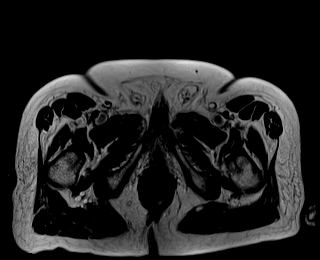
[im 32/32]
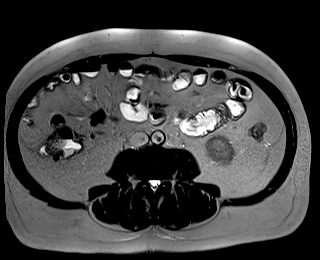

[Series 7: T2 · axial · 6.0mm · 1.25mm/px · 1 of 32 slices shown (3 of 3)]
[im 1/32]
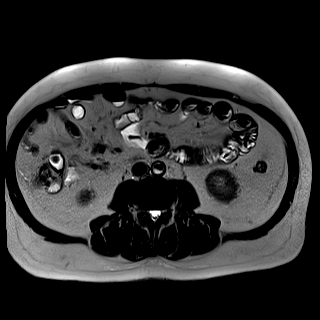

[Series 12: DWI · axial · 7.0mm · 1.42mm/px · z∈[-140,+301]mm · 2 of 64 slices shown (1 of 2)]
[im 1/64]
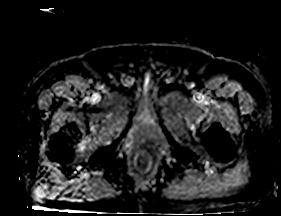
[im 64/64]
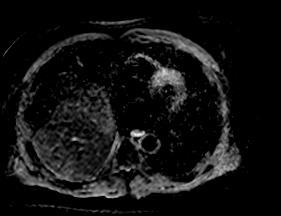

[Series 13: DWI · axial · 7.0mm · 1.42mm/px · z∈[-140,+301]mm · 5 of 192 slices shown (2 of 2)]
[im 1/192]
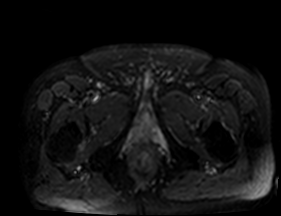
[im 48/192]
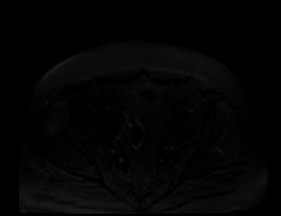
[im 96/192]
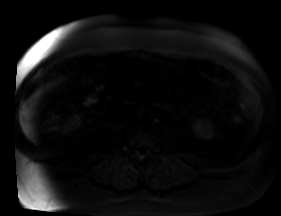
[im 144/192]
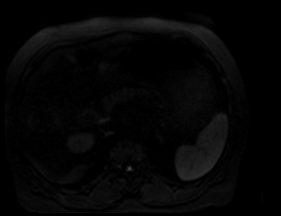
[im 192/192]
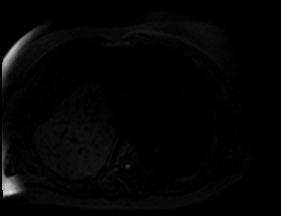

[Series 14: bSSFP · coronal · 6.0mm · 0.70mm/px · 1 of 45 slices shown]
[im 1/45]
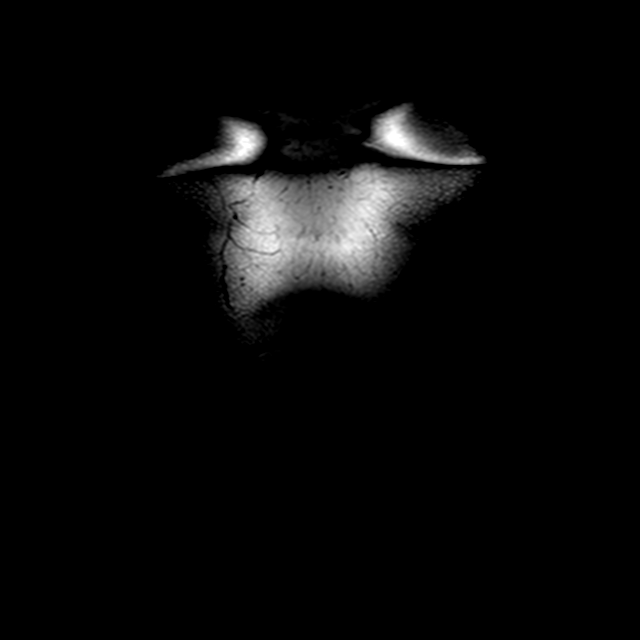

[Series 15: T1 dynamic · axial · 3.0mm · 1.64mm/px · z∈[-99,+281]mm · 3 of 128 slices shown (1 of 11)]
[im 1/128]
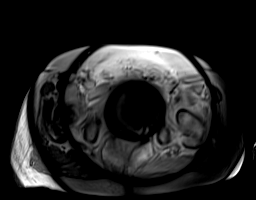
[im 64/128]
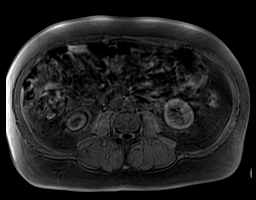
[im 128/128]
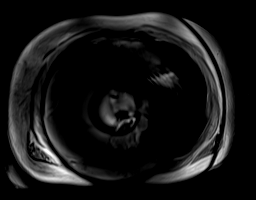

[Series 16: T1 dynamic · axial · 3.0mm · 1.64mm/px · z∈[-99,+281]mm · 3 of 128 slices shown (2 of 11)]
[im 1/128]
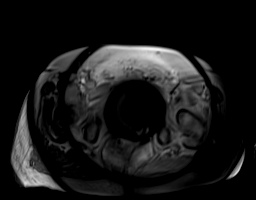
[im 64/128]
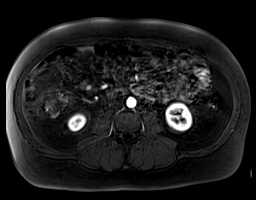
[im 128/128]
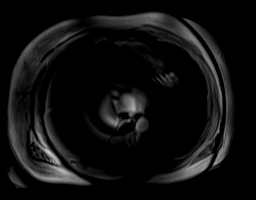

[Series 17: T1 dynamic · axial · 3.0mm · 1.64mm/px · z∈[-99,+281]mm · 3 of 128 slices shown (3 of 11)]
[im 1/128]
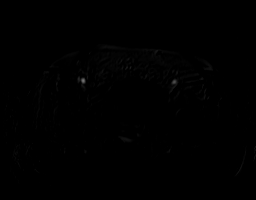
[im 64/128]
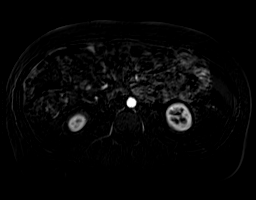
[im 128/128]
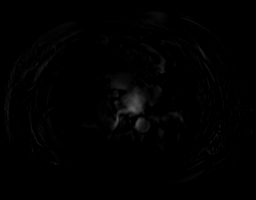

[Series 18: T1 dynamic · axial · 3.0mm · 1.64mm/px · z∈[-99,+281]mm · 3 of 128 slices shown (4 of 11)]
[im 1/128]
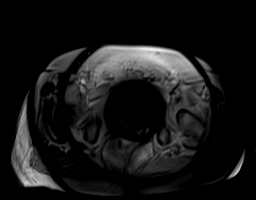
[im 64/128]
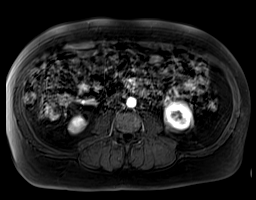
[im 128/128]
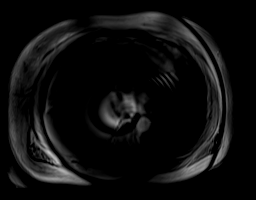

[Series 19: T1 dynamic · axial · 3.0mm · 1.64mm/px · z∈[-99,+281]mm · 3 of 128 slices shown (5 of 11)]
[im 1/128]
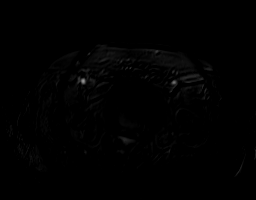
[im 64/128]
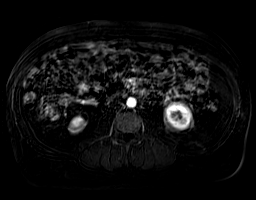
[im 128/128]
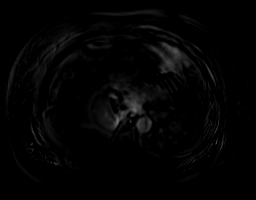

[Series 20: T1 dynamic · axial · 3.0mm · 1.64mm/px · z∈[-99,+281]mm · 3 of 128 slices shown (6 of 11)]
[im 1/128]
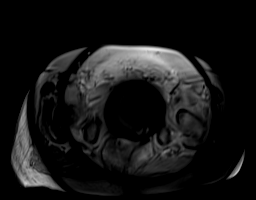
[im 64/128]
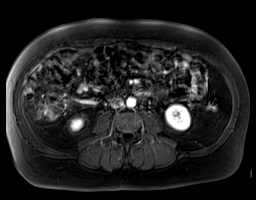
[im 128/128]
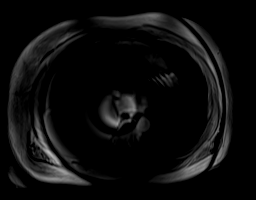

[Series 21: T1 dynamic · axial · 3.0mm · 1.64mm/px · z∈[-99,+281]mm · 3 of 128 slices shown (7 of 11)]
[im 1/128]
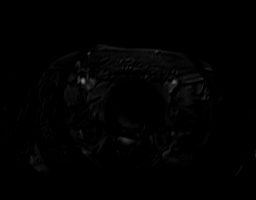
[im 64/128]
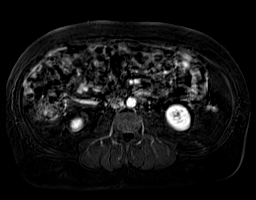
[im 128/128]
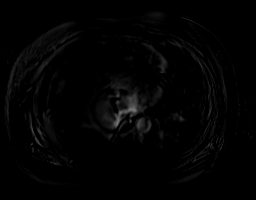

[Series 22: T1 dynamic · coronal · 1.6mm · 1.76mm/px · 4 of 160 slices shown (8 of 11)]
[im 1/160]
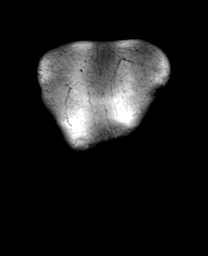
[im 54/160]
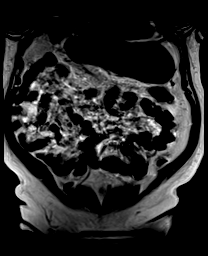
[im 107/160]
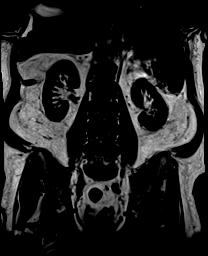
[im 160/160]
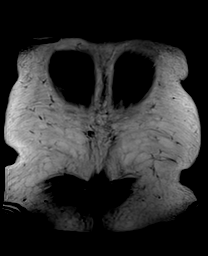

[Series 23: T1 dynamic · coronal · 1.6mm · 1.76mm/px · 4 of 160 slices shown (9 of 11)]
[im 1/160]
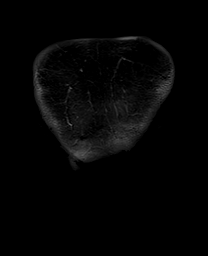
[im 54/160]
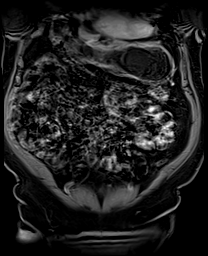
[im 107/160]
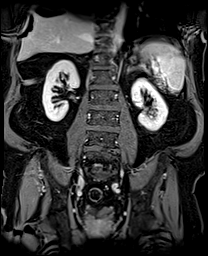
[im 160/160]
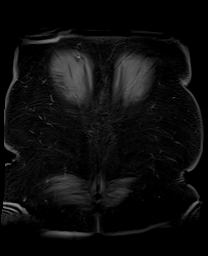

[Series 24: T1 dynamic · axial · 3.0mm · 1.64mm/px · z∈[-99,+281]mm · 3 of 128 slices shown (10 of 11)]
[im 1/128]
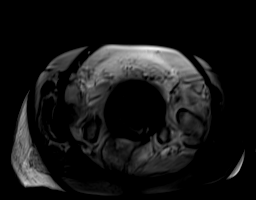
[im 64/128]
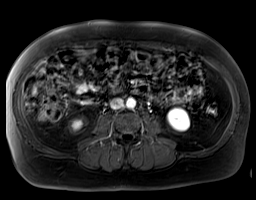
[im 128/128]
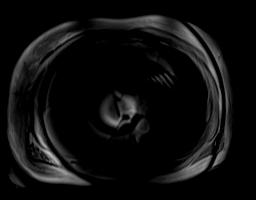

[Series 25: T1 dynamic · axial · 3.0mm · 1.64mm/px · z∈[-99,+281]mm · 3 of 128 slices shown (11 of 11)]
[im 1/128]
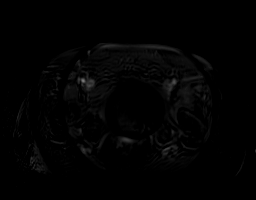
[im 64/128]
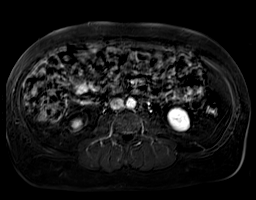
[im 128/128]
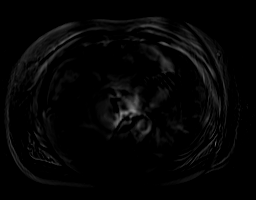

[48 of 48 positions shown; findings below may reference images not displayed]

FINDINGS: COMBINED FINDINGS FOR BOTH MR ABDOMEN AND PELVIS

Exam detail is diminished due to motion artifact

Lower chest: Asymmetric elevation of the right hemidiaphragm is
again noted. No acute findings within the imaged portions of the
lung bases.

Hepatobiliary: Scattered liver cysts are identified. The largest is
in the left lobe of liver containing internal septation measuring
2.7 cm. Six signs of previous cholecystectomy. No bile duct
dilatation.

Pancreas: No mass, inflammatory changes, or other parenchymal
abnormality identified.

Spleen:  Within normal limits in size and appearance.

Adrenals/Urinary Tract: Normal adrenal glands. No kidney mass or
hydronephrosis. Urinary bladder is unremarkable.

Stomach/Bowel: Stomach appears normal. The appendix is visualized
and is within normal limits. No bowel wall thickening, inflammation,
or distension. Unfortunately, due to motion artifact the
postcontrast images of the large and small bowel loops are
significantly degraded by motion. No obstructing mass identified.No
focal small bowel abnormality identified correspond with the focal
small bowel uptake noted on the previous PET-CT.

Vascular/Lymphatic: Normal caliber of the abdominal aorta. Within
the ileocolic mesentery there is a enhancing mass measuring 2.6 x
1.6 cm which corresponds with the neuroendocrine receptor avid tumor
identified on recent PET-CT, image 90/23. No enlarged
retroperitoneal lymph nodes. No pelvic or inguinal adenopathy.

Reproductive: Prostate is unremarkable.

Other:  No free fluid or fluid collections identified

Musculoskeletal: No suspicious bone lesions identified.
IMPRESSION: 1. Exam detail is diminished due to motion artifact.
2. Enhancing mass within the ileocolic mesentery corresponds with
the neuroendocrine receptor avid tumor identified on recent PET-CT.
3. No focal small bowel abnormality identified correspond with the
focal small bowel uptake noted on previous PET-CT.

## 2021-10-10 IMAGING — MR MR ^MR ENTEROGRAPHY W/WO
8 series · 48 of 48 positions shown · IV contrast (gadavist)
Comparison: PET-CT from [DATE] asymmetric elevation

CLINICAL DATA: History of neuroendocrine tumor receptor avid tumor
within the small bowel mesentery.

EXAM:
MR ABDOMEN AND PELVIS WITHOUT AND WITH CONTRAST (MR ENTEROGRAPHY)
TECHNIQUE: Multiplanar, multisequence MRI of the abdomen and pelvis was
performed both before and during bolus administration of intravenous
contrast. Negative oral contrast VoLumen was given.
CONTRAST:  10mL GADAVIST GADOBUTROL 1 MMOL/ML IV SOLN

[Series 4: T2 · coronal · 6.0mm · 0.88mm/px · 2 of 45 slices shown (1 of 3)]
[im 1/45]
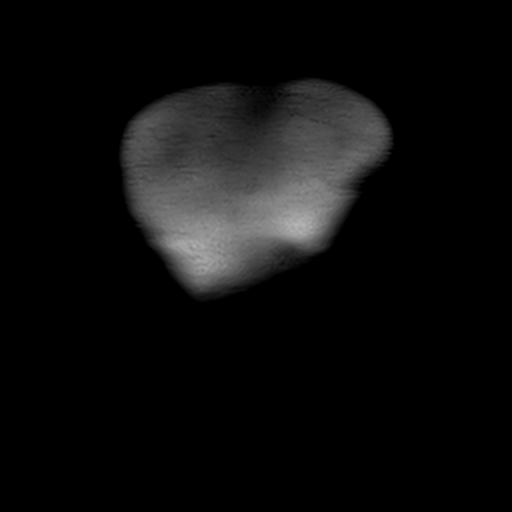
[im 45/45]
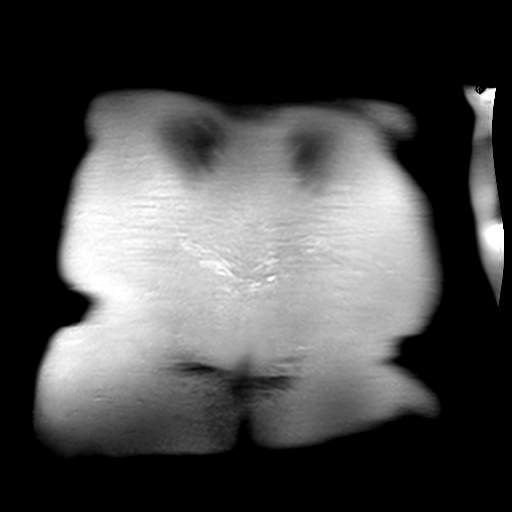

[Series 7: T2 · axial · 6.0mm · 1.25mm/px · z∈[-140,+77]mm · 2 of 32 slices shown (2 of 3)]
[im 1/32]
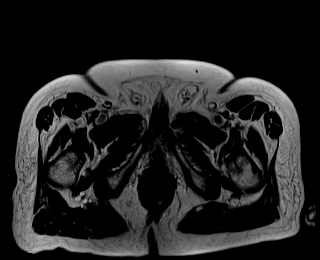
[im 32/32]
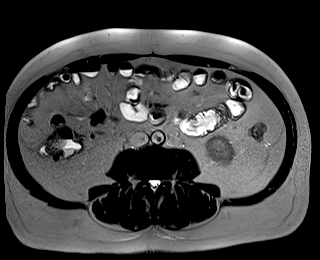

[Series 7: T2 · axial · 6.0mm · 1.25mm/px · z∈[+84,+301]mm · 2 of 32 slices shown (3 of 3)]
[im 1/32]
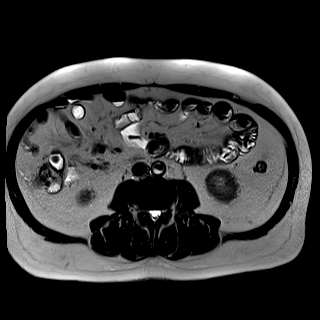
[im 32/32]
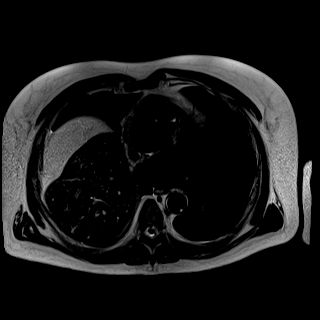

[Series 12: DWI · axial · 7.0mm · 1.42mm/px · z∈[-140,+301]mm · 4 of 64 slices shown (1 of 2)]
[im 1/64]
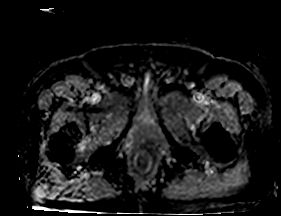
[im 22/64]
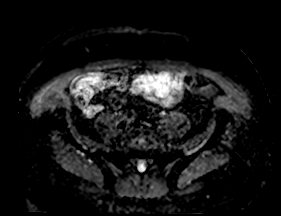
[im 43/64]
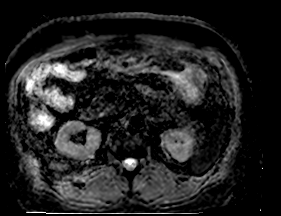
[im 64/64]
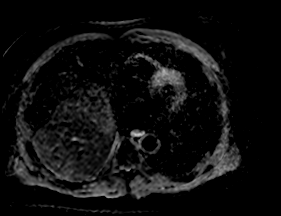

[Series 13: DWI · axial · 7.0mm · 1.42mm/px · z∈[-140,+301]mm · 13 of 192 slices shown (2 of 2)]
[im 1/192]
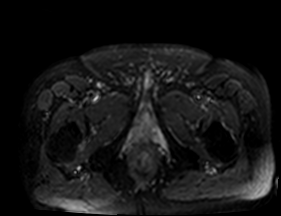
[im 16/192]
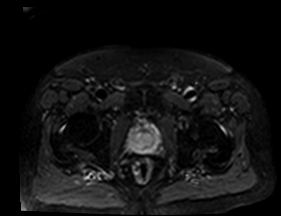
[im 32/192]
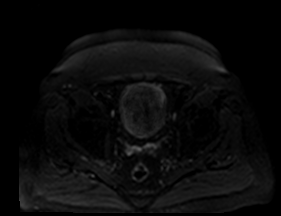
[im 48/192]
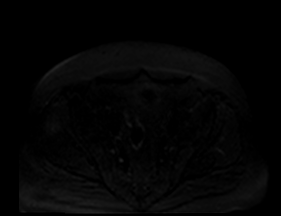
[im 64/192]
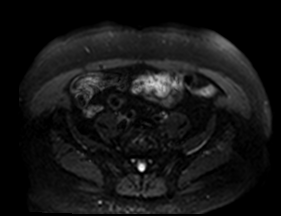
[im 80/192]
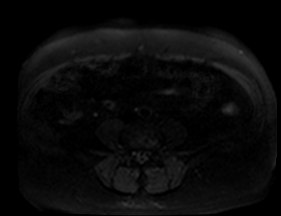
[im 96/192]
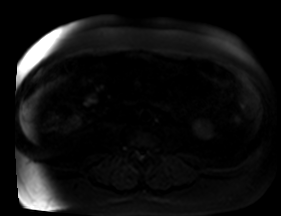
[im 112/192]
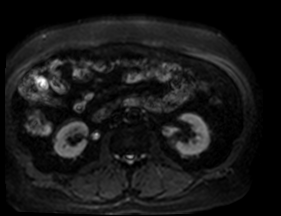
[im 128/192]
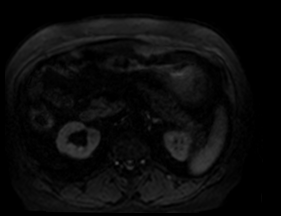
[im 144/192]
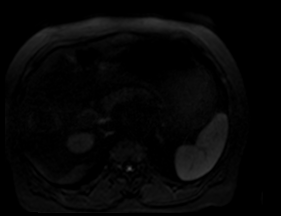
[im 160/192]
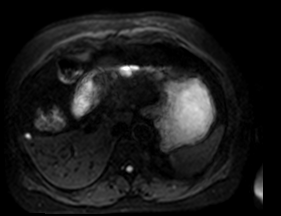
[im 176/192]
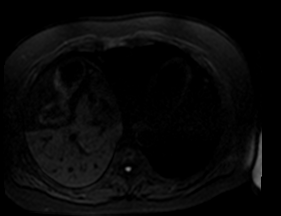
[im 192/192]
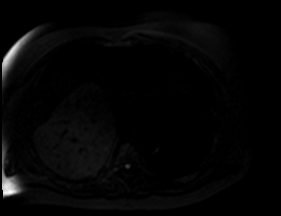

[Series 14: bSSFP · coronal · 6.0mm · 0.70mm/px · 3 of 45 slices shown]
[im 1/45]
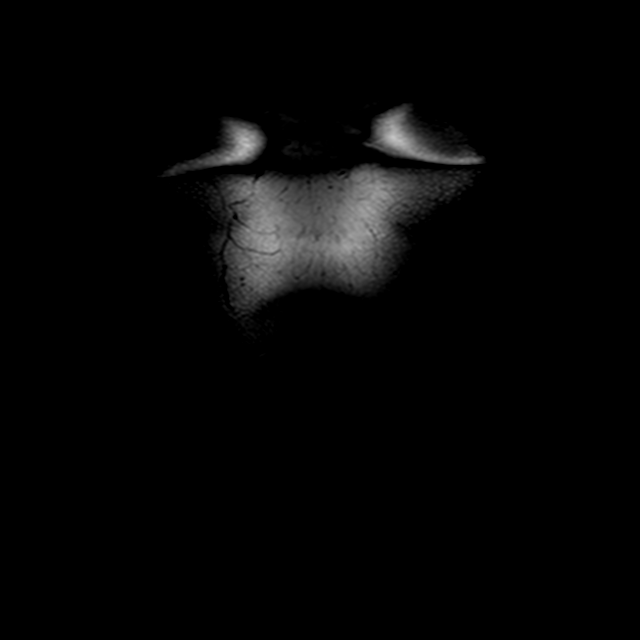
[im 23/45]
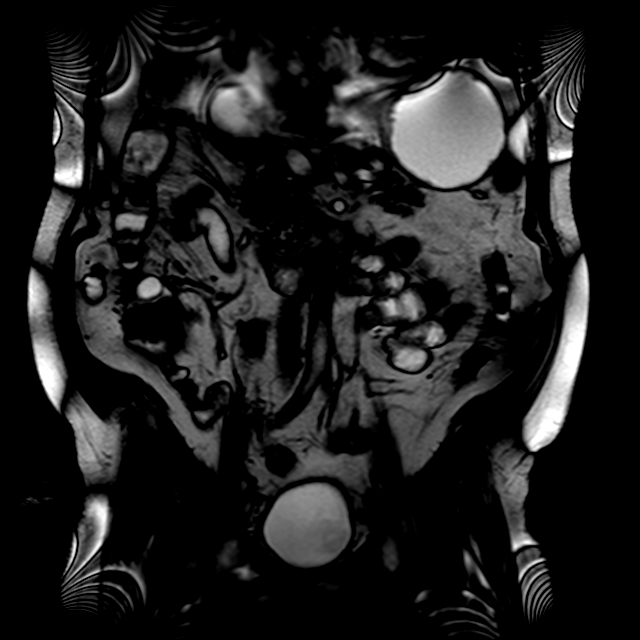
[im 45/45]
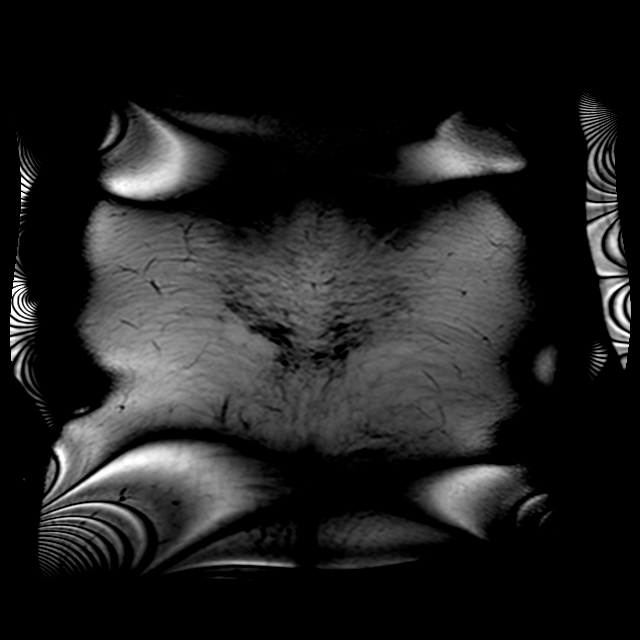

[Series 22: T1 dynamic · coronal · 1.6mm · 1.76mm/px · 11 of 160 slices shown (1 of 2)]
[im 1/160]
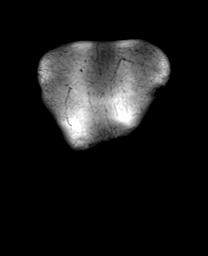
[im 16/160]
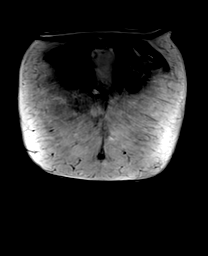
[im 32/160]
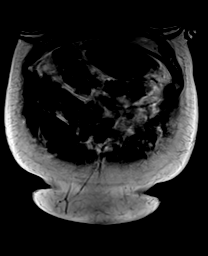
[im 48/160]
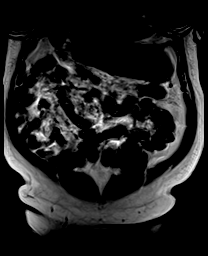
[im 64/160]
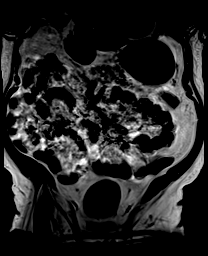
[im 80/160]
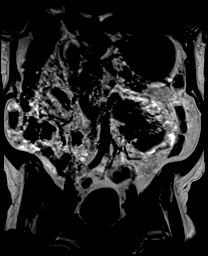
[im 96/160]
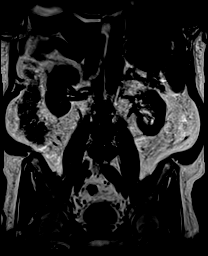
[im 112/160]
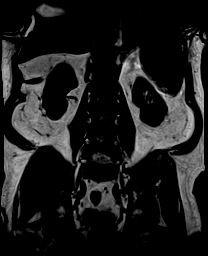
[im 128/160]
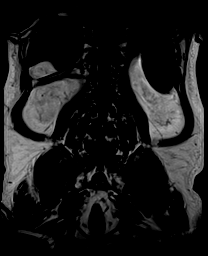
[im 144/160]
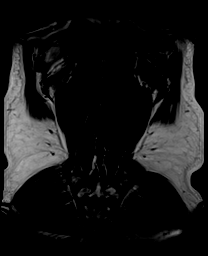
[im 160/160]
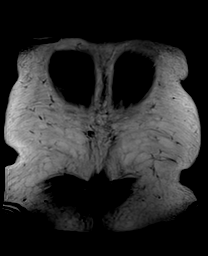

[Series 23: T1 dynamic · coronal · 1.6mm · 1.76mm/px · 11 of 160 slices shown (2 of 2)]
[im 1/160]
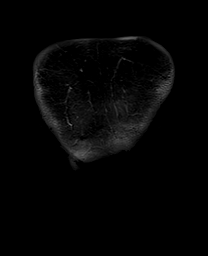
[im 16/160]
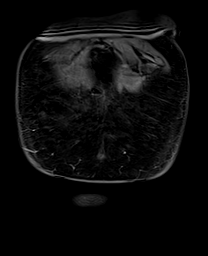
[im 32/160]
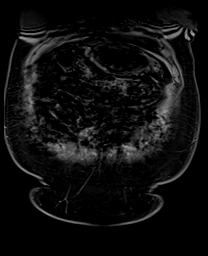
[im 48/160]
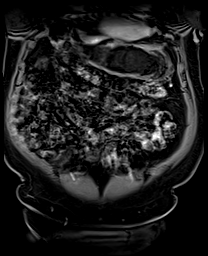
[im 64/160]
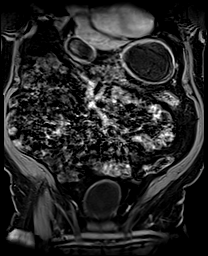
[im 80/160]
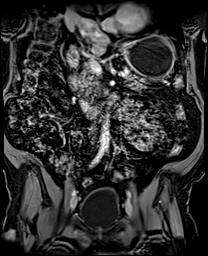
[im 96/160]
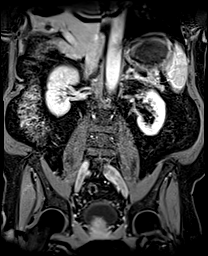
[im 112/160]
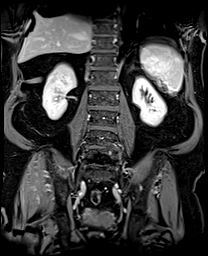
[im 128/160]
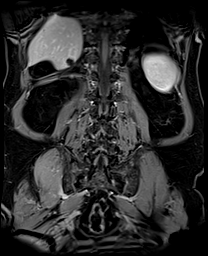
[im 144/160]
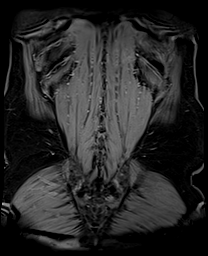
[im 160/160]
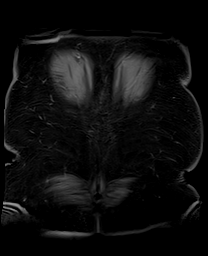

[48 of 48 positions shown; findings below may reference images not displayed]

FINDINGS: COMBINED FINDINGS FOR BOTH MR ABDOMEN AND PELVIS

Exam detail is diminished due to motion artifact

Lower chest: Asymmetric elevation of the right hemidiaphragm is
again noted. No acute findings within the imaged portions of the
lung bases.

Hepatobiliary: Scattered liver cysts are identified. The largest is
in the left lobe of liver containing internal septation measuring
2.7 cm. Six signs of previous cholecystectomy. No bile duct
dilatation.

Pancreas: No mass, inflammatory changes, or other parenchymal
abnormality identified.

Spleen:  Within normal limits in size and appearance.

Adrenals/Urinary Tract: Normal adrenal glands. No kidney mass or
hydronephrosis. Urinary bladder is unremarkable.

Stomach/Bowel: Stomach appears normal. The appendix is visualized
and is within normal limits. No bowel wall thickening, inflammation,
or distension. Unfortunately, due to motion artifact the
postcontrast images of the large and small bowel loops are
significantly degraded by motion. No obstructing mass identified.No
focal small bowel abnormality identified correspond with the focal
small bowel uptake noted on the previous PET-CT.

Vascular/Lymphatic: Normal caliber of the abdominal aorta. Within
the ileocolic mesentery there is a enhancing mass measuring 2.6 x
1.6 cm which corresponds with the neuroendocrine receptor avid tumor
identified on recent PET-CT, image 90/23. No enlarged
retroperitoneal lymph nodes. No pelvic or inguinal adenopathy.

Reproductive: Prostate is unremarkable.

Other:  No free fluid or fluid collections identified

Musculoskeletal: No suspicious bone lesions identified.
IMPRESSION: 1. Exam detail is diminished due to motion artifact.
2. Enhancing mass within the ileocolic mesentery corresponds with
the neuroendocrine receptor avid tumor identified on recent PET-CT.
3. No focal small bowel abnormality identified correspond with the
focal small bowel uptake noted on previous PET-CT.

## 2021-10-10 MED ORDER — GADOBUTROL 1 MMOL/ML IV SOLN
10.0000 mL | Freq: Once | INTRAVENOUS | Status: AC | PRN
  Administered 2021-10-10: 10 mL via INTRAVENOUS

## 2021-10-12 ENCOUNTER — Encounter
Admission: RE | Disposition: A | Discharge status: Home / Self Care | Payer: Self-pay | Source: Home / Self Care | Attending: Gastroenterology

## 2021-10-12 ENCOUNTER — Ambulatory Visit: Payer: Medicare HMO | Admitting: Anesthesiology

## 2021-10-12 ENCOUNTER — Ambulatory Visit
Admission: RE | Admit: 2021-10-12 | Discharge: 2021-10-12 | Disposition: A | Discharge status: Home / Self Care | Payer: Medicare HMO | Attending: Gastroenterology | Admitting: Gastroenterology

## 2021-10-12 DIAGNOSIS — R1013 Epigastric pain: Secondary | ICD-10-CM | POA: Insufficient documentation

## 2021-10-12 DIAGNOSIS — J449 Chronic obstructive pulmonary disease, unspecified: Secondary | ICD-10-CM | POA: Diagnosis not present

## 2021-10-12 DIAGNOSIS — F17200 Nicotine dependence, unspecified, uncomplicated: Secondary | ICD-10-CM | POA: Diagnosis not present

## 2021-10-12 DIAGNOSIS — Z6832 Body mass index (BMI) 32.0-32.9, adult: Secondary | ICD-10-CM | POA: Diagnosis not present

## 2021-10-12 DIAGNOSIS — Z1211 Encounter for screening for malignant neoplasm of colon: Secondary | ICD-10-CM | POA: Diagnosis not present

## 2021-10-12 DIAGNOSIS — Z86010 Personal history of colonic polyps: Secondary | ICD-10-CM | POA: Insufficient documentation

## 2021-10-12 DIAGNOSIS — K2900 Acute gastritis without bleeding: Secondary | ICD-10-CM | POA: Diagnosis not present

## 2021-10-12 DIAGNOSIS — K31A19 Gastric intestinal metaplasia without dysplasia, unspecified site: Secondary | ICD-10-CM | POA: Diagnosis not present

## 2021-10-12 DIAGNOSIS — K2950 Unspecified chronic gastritis without bleeding: Secondary | ICD-10-CM | POA: Insufficient documentation

## 2021-10-12 DIAGNOSIS — K635 Polyp of colon: Secondary | ICD-10-CM | POA: Diagnosis not present

## 2021-10-12 DIAGNOSIS — E7800 Pure hypercholesterolemia, unspecified: Secondary | ICD-10-CM | POA: Insufficient documentation

## 2021-10-12 DIAGNOSIS — E669 Obesity, unspecified: Secondary | ICD-10-CM | POA: Diagnosis not present

## 2021-10-12 DIAGNOSIS — K319 Disease of stomach and duodenum, unspecified: Secondary | ICD-10-CM | POA: Diagnosis not present

## 2021-10-12 DIAGNOSIS — R1084 Generalized abdominal pain: Secondary | ICD-10-CM | POA: Diagnosis not present

## 2021-10-12 DIAGNOSIS — B9681 Helicobacter pylori [H. pylori] as the cause of diseases classified elsewhere: Secondary | ICD-10-CM | POA: Diagnosis not present

## 2021-10-12 DIAGNOSIS — K640 First degree hemorrhoids: Secondary | ICD-10-CM | POA: Diagnosis not present

## 2021-10-12 DIAGNOSIS — I10 Essential (primary) hypertension: Secondary | ICD-10-CM | POA: Diagnosis not present

## 2021-10-12 DIAGNOSIS — D122 Benign neoplasm of ascending colon: Secondary | ICD-10-CM | POA: Insufficient documentation

## 2021-10-12 DIAGNOSIS — K649 Unspecified hemorrhoids: Secondary | ICD-10-CM | POA: Diagnosis not present

## 2021-10-12 DIAGNOSIS — K2970 Gastritis, unspecified, without bleeding: Secondary | ICD-10-CM | POA: Diagnosis not present

## 2021-10-12 HISTORY — PX: COLONOSCOPY WITH PROPOFOL: SHX5780

## 2021-10-12 HISTORY — PX: ESOPHAGOGASTRODUODENOSCOPY: SHX5428

## 2021-10-12 SURGERY — COLONOSCOPY WITH PROPOFOL
Anesthesia: General

## 2021-10-12 MED ORDER — PROPOFOL 500 MG/50ML IV EMUL
INTRAVENOUS | Status: AC
  Filled 2021-10-12: qty 50

## 2021-10-12 MED ORDER — DEXMEDETOMIDINE HCL IN NACL 200 MCG/50ML IV SOLN
INTRAVENOUS | Status: DC | PRN
  Administered 2021-10-12: 12 ug via INTRAVENOUS

## 2021-10-12 MED ORDER — PROPOFOL 10 MG/ML IV BOLUS
INTRAVENOUS | Status: DC | PRN
Start: 2021-10-12 — End: 2021-10-12
  Administered 2021-10-12: 80 mg via INTRAVENOUS
  Administered 2021-10-12: 40 mg via INTRAVENOUS

## 2021-10-12 MED ORDER — SODIUM CHLORIDE 0.9 % IV SOLN: INTRAVENOUS | Status: DC

## 2021-10-12 MED ORDER — PROPOFOL 500 MG/50ML IV EMUL
INTRAVENOUS | Status: DC | PRN
  Administered 2021-10-12: 125 ug/kg/min via INTRAVENOUS

## 2021-10-12 MED ORDER — LIDOCAINE HCL (CARDIAC) PF 100 MG/5ML IV SOSY
PREFILLED_SYRINGE | INTRAVENOUS | Status: DC | PRN
  Administered 2021-10-12: 100 mg via INTRAVENOUS

## 2021-10-12 NOTE — Op Note (Signed)
[facility] Gastroenterology Patient Name: Dominic Turner Procedure Date: 10/12/2021 12:27 PM MRN: [id number] Account #: [id number] Date of Birth: 01-Dec-1946 Admit Type: Outpatient Age: 75 Room: [facility] ENDO ROOM 3 Gender: Male Note Status: Finalized Instrument Name: Upper Endoscope [id number] Procedure:             Upper GI endoscopy Indications:           Dyspepsia Providers:             [name] MD, MD Medicines:             Monitored Anesthesia Care Complications:         No immediate complications. Procedure:             Pre-Anesthesia Assessment:                        - Prior to the procedure, a History and Physical was                         performed, and patient medications, allergies and                         sensitivities were reviewed. The patient's tolerance                         of previous anesthesia was reviewed.                        - The risks and benefits of the procedure and the                         sedation options and risks were discussed with the                         patient. All questions were answered and informed                         consent was obtained.                        - ASA Grade Assessment: II - A patient with mild                         systemic disease.                        After obtaining informed consent, the endoscope was                         passed under direct vision. Throughout the procedure,                         the patient's blood pressure, pulse, and oxygen                         saturations were monitored continuously. The Endoscope                         was introduced through the mouth, and advanced to the  third part of duodenum. The upper GI endoscopy was                         accomplished with ease. The patient tolerated the                         procedure well. Findings:      The examined esophagus was normal.      Diffuse moderate inflammation  characterized by congestion (edema) and       erythema was found in the entire examined stomach. Biopsies were taken       with a cold forceps for histology.      A large submucosal mass with no bleeding was found in the third portion       of the duodenum.      2 submucosal masses seen, one likely the ampulla but appeared       significantly much larger Impression:            - Normal esophagus.                        - Gastritis. Biopsied.                        - Duodenal mass. Recommendation:        - Await pathology results.                        - Perform an upper endoscopic ultrasound (UEUS) in 4                         weeks.                        - Perform a colonoscopy today. Procedure Code(s):     --- Professional ---                        [phone], Esophagogastroduodenoscopy, flexible,                         transoral; with biopsy, single or multiple Diagnosis Code(s):     --- Professional ---                        K29.70, Gastritis, unspecified, without bleeding                        K31.89, Other diseases of stomach and duodenum                        R10.13, Epigastric pain CPT copyright 2019 American Medical Association. All rights reserved. The codes documented in this report are preliminary and upon coder review may  be revised to meet current compliance requirements. [name], MD [name] MD, MD 10/12/2021 12:55:46 PM This report has been signed electronically. Number of Addenda: 0 Note Initiated On: 10/12/2021 12:27 PM Estimated Blood Loss:  Estimated blood loss: none.      [facility]

## 2021-10-12 NOTE — Anesthesia Preprocedure Evaluation (Signed)
Anesthesia Evaluation  Patient identified by MRN, date of birth, ID band Patient awake    Reviewed: Allergy & Precautions, NPO status , Patient's Chart, lab work & pertinent test results  History of Anesthesia Complications Negative for: history of anesthetic complications  Airway Mallampati: III  TM Distance: >3 FB Neck ROM: Full    Dental  (+) Partial Upper   Pulmonary sleep apnea , COPD, Current SmokerPatient did not abstain from smoking.,    breath sounds clear to auscultation- rhonchi (-) wheezing      Cardiovascular hypertension, Pt. 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               Post bronchodilator test not improved. 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, treated     Comment:  s/p rifampin x106mo No date: Pure hypercholesterolemia No date: Tobacco use disorder No date: Unspecified gastritis and gastroduodenitis  without mention  of hemorrhage No date: Unspecified sleep apnea   Reproductive/Obstetrics                             Anesthesia Physical Anesthesia Plan  ASA: 3  Anesthesia Plan: General   Post-op Pain Management:    Induction: Intravenous  PONV Risk Score and Plan: 0 and Propofol infusion  Airway Management Planned: Natural Airway  Additional Equipment:   Intra-op Plan:   Post-operative Plan:   Informed Consent: I have reviewed the patients History and Physical, chart, labs and discussed the procedure including the risks, benefits and alternatives for the proposed anesthesia with the patient or authorized representative who has indicated his/her understanding and acceptance.     Dental advisory given  Plan Discussed with: CRNA and Anesthesiologist  Anesthesia Plan Comments:         Anesthesia Quick Evaluation

## 2021-10-12 NOTE — Anesthesia Postprocedure Evaluation (Signed)
Anesthesia Post Note  Patient: Dominic Turner  Procedure(s) Performed: COLONOSCOPY WITH PROPOFOL ESOPHAGOGASTRODUODENOSCOPY (EGD)  Patient location during evaluation: Endoscopy Anesthesia Type: General Level of consciousness: awake and alert and oriented Pain management: pain level controlled Vital Signs Assessment: post-procedure vital signs reviewed and stable Respiratory status: spontaneous breathing, nonlabored ventilation and respiratory function stable Cardiovascular status: blood pressure returned to baseline and stable Postop Assessment: no signs of nausea or vomiting Anesthetic complications: no   No notable events documented.   Last Vitals:  Vitals:   10/12/21 1334 10/12/21 1344  BP: (!) 156/72 (!) 151/76  Pulse: 76 78  Resp: 17 18  Temp:    SpO2: 99% 100%    Last Pain:  Vitals:   10/12/21 1344  TempSrc:   PainSc: 0-No pain                 [name]

## 2021-10-12 NOTE — Op Note (Signed)
[facility] Gastroenterology Patient Name: Ryheem Jay Procedure Date: 10/12/2021 12:27 PM MRN: [id number] Account #: [id number] Date of Birth: 08/14/1946 Admit Type: Outpatient Age: 75 Room: [facility] ENDO ROOM 3 Gender: Male Note Status: Finalized Instrument Name: Jasper Riling [id number] Procedure:             Colonoscopy Indications:           Surveillance: Personal history of adenomatous polyps                         on last colonoscopy 5 years ago Providers:             [name] MD, MD Medicines:             Monitored Anesthesia Care Complications:         No immediate complications. Procedure:             Pre-Anesthesia Assessment:                        - ASA Grade Assessment: II - A patient with mild                         systemic disease.                        After obtaining informed consent, the colonoscope was                         passed under direct vision. Throughout the procedure,                         the patient's blood pressure, pulse, and oxygen                         saturations were monitored continuously. The                         Colonoscope was introduced through the anus and                         advanced to the the cecum, identified by the                         appendiceal orifice. The colonoscopy was performed                         with ease. The patient tolerated the procedure well.                         The quality of the bowel preparation was excellent. Findings:      The perianal and digital rectal examinations were normal.      Six sessile polyps were found in the ascending colon. The polyps were 4       to 6 mm in size. These polyps were removed with a cold snare. Resection       and retrieval were complete.      Non-bleeding internal hemorrhoids were found during retroflexion. The       hemorrhoids were large and Grade I (internal hemorrhoids that do not       prolapse).  The exam was otherwise without  abnormality on direct and retroflexion       views. Impression:            - Six 4 to 6 mm polyps in the ascending colon, removed                         with a cold snare. Resected and retrieved.                        - Non-bleeding internal hemorrhoids.                        - The examination was otherwise normal on direct and                         retroflexion views. Recommendation:        - Discharge patient to home (with escort).                        - Resume previous diet.                        - Continue present medications.                        - Await pathology results.                        - Repeat colonoscopy for surveillance based on                         pathology results.                        - Return to GI office as previously scheduled. Procedure Code(s):     --- Professional ---                        [phone], Colonoscopy, flexible; with removal of                         tumor(s), polyp(s), or other lesion(s) by snare                         technique Diagnosis Code(s):     --- Professional ---                        K63.5, Polyp of colon                        K64.0, First degree hemorrhoids                        Z86.010, Personal history of colonic polyps CPT copyright 2019 American Medical Association. All rights reserved. The codes documented in this report are preliminary and upon coder review may  be revised to meet current compliance requirements. [name], MD [name] MD, MD 10/12/2021 1:12:32 PM This report has been signed electronically. Number of Addenda: 0 Note Initiated On: 10/12/2021 12:27 PM Scope Withdrawal Time: 0 hours 11 minutes 37 seconds  Total Procedure Duration: 0 hours 13 minutes  44 seconds  Estimated Blood Loss:  Estimated blood loss: none.      [facility]

## 2021-10-12 NOTE — Transfer of Care (Signed)
Immediate Anesthesia Transfer of Care Note  Patient: Dominic Turner  Procedure(s) Performed: COLONOSCOPY WITH PROPOFOL ESOPHAGOGASTRODUODENOSCOPY (EGD)  Patient Location: PACU  Anesthesia Type:MAC  Level of Consciousness: awake and drowsy  Airway & Oxygen Therapy: Patient Spontanous Breathing and Patient connected to nasal cannula oxygen  Post-op Assessment: Report given to RN and Post -op Vital signs reviewed and stable  Post vital signs: stable  Last Vitals:  Vitals Value Taken Time  BP 105/51 10/12/21 1314  Temp    Pulse 65 10/12/21 1314  Resp 16 10/12/21 1314  SpO2 100 % 10/12/21 1314  Vitals shown include unvalidated device data.  Last Pain:  Vitals:   10/12/21 1209  TempSrc: Temporal  PainSc: 0-No pain         Complications: No notable events documented.

## 2021-10-12 NOTE — H&P (Signed)
Dominic Bellows, MD [address], [place], [place], [place], [address] [address], [place], [place], [place], [address] Phone: [phone]  Fax: [phone]  Primary Care Physician:  Ria Bush, MD   Pre-Procedure History & Physical: HPI:  Dominic Turner is a 75 y.o. male is here for an endoscopy and colonoscopy    Past Medical History:  Diagnosis Date   Acute cholecystitis 12/2015   ANXIETY DEPRESSION 10/23/2008   Beta thalassemia minor 04/2013   presumed by CBC (consider periph smear and Hgb EP)   Bilateral high frequency sensorineural hearing loss    rec hearing aides by ENT   COPD, severe ([place]) 03/2014   Moderately severe obstruction, with low vital capacity. Post bronchodilator test not improved.   Diverticulosis 02/2013   by colonoscopy   Essential hypertension, benign    History of adenomatous polyp of colon 02/2013   rec rpt 3 yrs   Overweight(278.02)    Positive PPD, treated 2014   s/p rifampin x21mo   Pure hypercholesterolemia    Tobacco use disorder    Unspecified gastritis and gastroduodenitis without mention of hemorrhage    Unspecified sleep apnea     Past Surgical History:  Procedure Laterality Date   CATARACT EXTRACTION, BILATERAL Bilateral    CHOLECYSTECTOMY N/A 12/30/2015   Procedure: LAPAROSCOPIC CHOLECYSTECTOMY;  Surgeon: Florene Glen, MD;  Location: [facility] ORS;  Service: General;  Laterality: N/A;   COLONOSCOPY  03/03/2013   tubular adenoma x3, diverticulosis, rpt 3 yrs (Dr. Cristina Gong)   COLONOSCOPY  05/2016   TA x3, diverticulosis, single angiodysplastic lesion, rpt 3 yrs (Buccini)   COLONOSCOPY  09/2019   TA, HP, rpt 5 yrs (Buccini)    Prior to Admission medications   Medication Sig Start Date End Date Taking? Authorizing Provider  albuterol (PROVENTIL HFA;VENTOLIN HFA) 108 (90 Base) MCG/ACT inhaler Take 2 puffs in AM & PM every day for next 5 days, & also can use every 4-6 hours as needed. After 5 days, use only every 4-6 hrs as  needed 10/31/18  Yes Guse, Jacquelynn Cree, FNP  amLODipine (NORVASC) 10 MG tablet Take 1 tablet (10 mg total) by mouth daily. 02/18/21  Yes Ria Bush, MD  azelastine (ASTELIN) 0.1 % nasal spray Place 1 spray into both nostrils 2 (two) times daily. Use in each nostril as directed 02/18/21  Yes Ria Bush, MD  fluticasone [facility]) 50 MCG/ACT nasal spray USE 2 SPRAYS IN BOTH  NOSTRILS DAILY 12/24/20  Yes Ria Bush, MD  tiotropium (SPIRIVA HANDIHALER) 18 MCG inhalation capsule INHALE THE CONTENTS OF 1  CAPSULE BY MOUTH VIA  HANDIHALER DAILY AT BEDTIME 02/18/21  Yes Ria Bush, MD  clobetasol (TEMOVATE) 0.05 % external solution Apply topically. 12/25/19   [provider]  Docusate Calcium (STOOL SOFTENER PO) Take 1 tablet by mouth daily.    [provider]  fluticasone (CUTIVATE) 0.05 % cream Apply topically. Apply to affected areas on face up to twice a day as needed 11/11/19   [provider]  loratadine (CLARITIN) 10 MG tablet Take 1 tablet (10 mg total) by mouth daily. 07/22/19   Ria Bush, MD  Multiple Vitamin Essential TABS Take 1 tablet by mouth daily.    [provider]  omeprazole (PRILOSEC) 20 MG capsule TAKE 1 CAPSULE (20 MG TOTAL) BY MOUTH DAILY. FOR 3 WEEKS THEN AS NEEDED 10/05/21   Ria Bush, MD  ondansetron (ZOFRAN-ODT) 4 MG disintegrating tablet Take 1 tablet (4 mg total) by mouth every  8 (eight) hours as needed for nausea or vomiting. 09/01/21   Triplett, Cari B, FNP  polyethylene glycol powder (GLYCOLAX/MIRALAX) 17 GM/SCOOP powder Take 17 g by mouth daily. 07/22/19   Ria Bush, MD  pravastatin (PRAVACHOL) 40 MG tablet Take 1 tablet (40 mg total) by mouth daily. 02/18/21   Ria Bush, MD  tamsulosin (FLOMAX) 0.4 MG CAPS capsule Take 1 capsule (0.4 mg total) by mouth daily. 02/18/21   Ria Bush, MD  traZODone (DESYREL) 50 MG tablet TAKE ONE AND ONE-HALF TABLETS BY MOUTH AT BEDTIME AS NEEDED 08/03/21    [provider]    Allergies as of 09/28/2021 - Review Complete 09/28/2021  Allergen Reaction Noted   Aspirin Other (See Comments)    Sertraline hcl Other (See Comments) 11/27/2008   Sulfonamide derivatives Other (See Comments)    Wellbutrin [bupropion] Rash 04/09/2014    Family History  Problem Relation Age of Onset   Cirrhosis Father        died in 70's   Hypertension Mother    Heart attack Sister    Stroke Sister    Heart attack Maternal Grandmother    Stroke Maternal Grandmother    Thyroid disease Sister    Diabetes Sister    Cancer Neg Hx     Social History   Socioeconomic History   Marital status: Married    Spouse name: Not on file   Number of children: 2   Years of education: Not on file   Highest education level: Not on file  Occupational History   Occupation: truck driver  Tobacco Use   Smoking status: Every Day    Packs/day: 0.75    Years: 56.00    Pack years: 42.00    Types: Cigarettes   Smokeless tobacco: Never  Vaping Use   Vaping Use: Never used  Substance and Sexual Activity   Alcohol use: No    Alcohol/week: 0.0 standard drinks   Drug use: No   Sexual activity: Never  Other Topics Concern   Not on file  Social History Narrative   Lives alone. Wife lives at [facility] in [place] in [place] (memory unit). Sees wife daily.    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    Social Determinants of Health   Financial Resource Strain: Low Risk    Difficulty of Paying Living Expenses: Not hard at all  Food Insecurity: No Food Insecurity   Worried About Charity fundraiser in the Last Year: Never true   [place] in the Last Year: Never true  Transportation Needs: No Transportation Needs    Lack of Transportation (Medical): No   Lack of Transportation (Non-Medical): No  Physical Activity: Inactive   Days of Exercise per Week: 0 days   Minutes of Exercise per Session: 0 min  Stress: No Stress Concern Present   Feeling of Stress : Not at all  Social Connections: Not on file  Intimate Partner Violence: Not At Risk   Fear of Current or Ex-Partner: No   Emotionally Abused: No   Physically Abused: No   Sexually Abused: No    Review of Systems: See HPI, otherwise negative ROS  Physical Exam: BP 136/77   Pulse 80   Temp (!)  96.4 F (35.8 C) (Temporal)   Resp 17   Ht 5\' 9"  (1.753 m)   Wt 99.3 kg   SpO2 99%   BMI 32.33 kg/m  General:   Alert,  pleasant and cooperative in NAD Head:  Normocephalic and atraumatic. Neck:  Supple; no masses or thyromegaly. Lungs:  Clear throughout to auscultation, normal respiratory effort.    Heart:  +S1, +S2, Regular rate and rhythm, No edema. Abdomen:  Soft, nontender and nondistended. Normal bowel sounds, without guarding, and without rebound.   Neurologic:  Alert and  oriented x4;  grossly normal neurologically.  Impression/Plan: Dominic Turner is here for an endoscopy and colonoscopy  to be performed for  evaluation of dyspepsia and personal history of colon polyps    Risks, benefits, limitations, and alternatives regarding endoscopy have been reviewed with the patient.  Questions have been answered.  All parties agreeable.   Dominic Bellows, MD  10/12/2021, 12:25 PM

## 2021-10-13 ENCOUNTER — Telehealth: Payer: Self-pay | Admitting: Internal Medicine

## 2021-10-13 ENCOUNTER — Encounter: Payer: Self-pay | Admitting: Gastroenterology

## 2021-10-13 LAB — SURGICAL PATHOLOGY

## 2021-10-13 NOTE — Telephone Encounter (Signed)
On 11/03-I discussed with Dr. 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  As per Dr. Samuel Jester organic cause of patient's ongoing dyspepsia/abdominal bloating.  Follow-up as planned.

## 2021-10-14 ENCOUNTER — Telehealth: Payer: Self-pay | Admitting: *Deleted

## 2021-10-14 NOTE — Telephone Encounter (Signed)
Patient called reporting that he had an upper endo and colonoscopy done and if Dr B has seen the results and what needs to be done now. This was done yesterday and biopsy was taken of Duodenal mass and gastritis. He is asking for return call as his follow up appointment is not until 11/18

## 2021-10-14 NOTE — Telephone Encounter (Signed)
Ok to sch. Apts sooner than 11/18 per Dr. Jacinto Reap. I attempted to reach patient to see if he would like to come Monday afternoon. No answer. Unable to leave vm. Phone line just rings.

## 2021-10-14 NOTE — Telephone Encounter (Signed)
Appointment given to patient for Monday at [place]

## 2021-10-17 ENCOUNTER — Other Ambulatory Visit: Payer: Self-pay

## 2021-10-17 ENCOUNTER — Inpatient Hospital Stay: Payer: Medicare HMO | Attending: Internal Medicine | Admitting: Internal Medicine

## 2021-10-17 ENCOUNTER — Encounter: Payer: Self-pay | Admitting: Internal Medicine

## 2021-10-17 DIAGNOSIS — Z8719 Personal history of other diseases of the digestive system: Secondary | ICD-10-CM | POA: Insufficient documentation

## 2021-10-17 DIAGNOSIS — Z8249 Family history of ischemic heart disease and other diseases of the circulatory system: Secondary | ICD-10-CM | POA: Diagnosis not present

## 2021-10-17 DIAGNOSIS — Z6831 Body mass index (BMI) 31.0-31.9, adult: Secondary | ICD-10-CM | POA: Diagnosis not present

## 2021-10-17 DIAGNOSIS — Z79899 Other long term (current) drug therapy: Secondary | ICD-10-CM | POA: Insufficient documentation

## 2021-10-17 DIAGNOSIS — F17210 Nicotine dependence, cigarettes, uncomplicated: Secondary | ICD-10-CM | POA: Insufficient documentation

## 2021-10-17 DIAGNOSIS — I2510 Atherosclerotic heart disease of native coronary artery without angina pectoris: Secondary | ICD-10-CM | POA: Insufficient documentation

## 2021-10-17 DIAGNOSIS — C7A8 Other malignant neuroendocrine tumors: Secondary | ICD-10-CM | POA: Diagnosis not present

## 2021-10-17 DIAGNOSIS — I700 Atherosclerosis of aorta: Secondary | ICD-10-CM | POA: Diagnosis not present

## 2021-10-17 DIAGNOSIS — Z882 Allergy status to sulfonamides status: Secondary | ICD-10-CM | POA: Diagnosis not present

## 2021-10-17 DIAGNOSIS — R1013 Epigastric pain: Secondary | ICD-10-CM | POA: Insufficient documentation

## 2021-10-17 DIAGNOSIS — K5730 Diverticulosis of large intestine without perforation or abscess without bleeding: Secondary | ICD-10-CM | POA: Insufficient documentation

## 2021-10-17 DIAGNOSIS — Z823 Family history of stroke: Secondary | ICD-10-CM | POA: Insufficient documentation

## 2021-10-17 DIAGNOSIS — Z833 Family history of diabetes mellitus: Secondary | ICD-10-CM | POA: Insufficient documentation

## 2021-10-17 DIAGNOSIS — R109 Unspecified abdominal pain: Secondary | ICD-10-CM | POA: Insufficient documentation

## 2021-10-17 DIAGNOSIS — Z9049 Acquired absence of other specified parts of digestive tract: Secondary | ICD-10-CM | POA: Insufficient documentation

## 2021-10-17 DIAGNOSIS — K5900 Constipation, unspecified: Secondary | ICD-10-CM | POA: Insufficient documentation

## 2021-10-17 DIAGNOSIS — Z886 Allergy status to analgesic agent status: Secondary | ICD-10-CM | POA: Insufficient documentation

## 2021-10-17 DIAGNOSIS — I10 Essential (primary) hypertension: Secondary | ICD-10-CM | POA: Diagnosis not present

## 2021-10-17 DIAGNOSIS — D3A098 Benign carcinoid tumors of other sites: Secondary | ICD-10-CM

## 2021-10-17 DIAGNOSIS — R110 Nausea: Secondary | ICD-10-CM | POA: Diagnosis not present

## 2021-10-17 DIAGNOSIS — R630 Anorexia: Secondary | ICD-10-CM | POA: Insufficient documentation

## 2021-10-17 DIAGNOSIS — G4730 Sleep apnea, unspecified: Secondary | ICD-10-CM | POA: Insufficient documentation

## 2021-10-17 DIAGNOSIS — E7800 Pure hypercholesterolemia, unspecified: Secondary | ICD-10-CM | POA: Diagnosis not present

## 2021-10-17 DIAGNOSIS — Z8349 Family history of other endocrine, nutritional and metabolic diseases: Secondary | ICD-10-CM | POA: Insufficient documentation

## 2021-10-17 DIAGNOSIS — Z86010 Personal history of colonic polyps: Secondary | ICD-10-CM | POA: Insufficient documentation

## 2021-10-17 DIAGNOSIS — N400 Enlarged prostate without lower urinary tract symptoms: Secondary | ICD-10-CM | POA: Diagnosis not present

## 2021-10-17 DIAGNOSIS — C7B8 Other secondary neuroendocrine tumors: Secondary | ICD-10-CM | POA: Diagnosis not present

## 2021-10-17 DIAGNOSIS — J449 Chronic obstructive pulmonary disease, unspecified: Secondary | ICD-10-CM | POA: Insufficient documentation

## 2021-10-17 DIAGNOSIS — Z8379 Family history of other diseases of the digestive system: Secondary | ICD-10-CM | POA: Insufficient documentation

## 2021-10-17 NOTE — Progress Notes (Signed)
[facility] OFFICE PROGRESS NOTE  Patient Care Team: Ria Bush, MD as PCP - General [name], MD as Consulting Physician (Gastroenterology)  Cancer Staging No matching staging information was found for the patient.   Oncology History   No history exists.    MPRESSION: 1. 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 Adjacent mildly enlarged noncalcified right mesenteric node, new. 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 DOTATATE PET-CT could be considered for further characterization. 2. No evidence of bowel obstruction or acute bowel inflammation. Mild scattered colonic diverticulosis, with no evidence of acute diverticulitis. 3. Mild prostatomegaly. 4. Coronary atherosclerosis. 5. Aortic Atherosclerosis (ICD10-I70   HISTORY OF PRESENT ILLNESS: Patient alone.  Ambulating independently.  Dominic Turner 75 y.o.  male pleasant patient above history of history of chronic GI/abdominal discomfort clinically suggestive of low-grade carcinoid/small bowel is here for follow-up.  In the interim patient underwent endoscopy-that showed submucosal duodenal lesion.   Patient continues to have poor appetite.  Abdominal discomfort.  Positive for nausea no vomiting.    Review of Systems  Constitutional:  Negative for chills, diaphoresis, fever, malaise/fatigue and weight loss.  HENT:  Negative for nosebleeds and sore throat.   Eyes:  Negative for double vision.  Respiratory:  Negative for cough, hemoptysis, sputum production, shortness of breath and wheezing.   Cardiovascular:  Negative for chest pain, palpitations, orthopnea and leg swelling.  Gastrointestinal:  Positive for abdominal pain, constipation and nausea. Negative for blood in stool, diarrhea, melena and vomiting.  Genitourinary:  Negative for dysuria, frequency and urgency.  Musculoskeletal:  Negative for back pain and joint pain.  Skin: Negative.   Negative for itching and rash.  Neurological:  Negative for dizziness, tingling, focal weakness, weakness and headaches.  Endo/Heme/Allergies:  Does not bruise/bleed easily.  Psychiatric/Behavioral:  Negative for depression. The patient is not nervous/anxious and does not have insomnia.      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 Post bronchodilator test not improved.   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 Cristina Gong)   COLONOSCOPY  05/2016   TA x3, diverticulosis, single angiodysplastic lesion, rpt 3 yrs (Buccini)   COLONOSCOPY  09/2019   TA, HP, rpt 5 yrs (Buccini)   COLONOSCOPY WITH PROPOFOL N/A 10/12/2021   multiple TA Vicente Males, Bailey Mech, MD)   ESOPHAGOGASTRODUODENOSCOPY N/A 10/12/2021   chronic gastritis with + H pylori, intestinal  metaplasia present Vicente Males, Bailey Mech, MD)    FAMILY HISTORY :   Family History  Problem Relation Age of Onset   Cirrhosis Father         died in 10's   Hypertension Mother    Heart attack Sister    Stroke Sister    Heart attack Maternal Grandmother    Stroke Maternal Grandmother    Thyroid disease Sister    Diabetes Sister    Cancer Neg Hx     SOCIAL HISTORY:   Social History   Tobacco Use   Smoking status: Every Day    Packs/day: 0.75    Years: 56.00    Pack years: 42.00    Types: Cigarettes   Smokeless tobacco: Never  Vaping Use   Vaping Use: Never used  Substance Use Topics   Alcohol use: No    Alcohol/week: 0.0 standard drinks   Drug use: No    ALLERGIES:  is allergic to aspirin, sertraline hcl, sulfonamide derivatives, and wellbutrin [bupropion].  MEDICATIONS:  Current Outpatient Medications  Medication Sig Dispense Refill   albuterol (PROVENTIL HFA;VENTOLIN HFA) 108 (90 Base) MCG/ACT inhaler Take 2 puffs in AM & PM every day for next 5 days, & also can use every 4-6 hours as needed. After 5 days, use only every 4-6 hrs as needed 1 Inhaler 0   amLODipine (NORVASC) 10 MG tablet Take 1 tablet (10 mg total) by mouth daily. 90 tablet 3   azelastine (ASTELIN) 0.1 % nasal spray Place 1 spray into both nostrils 2 (two) times daily. Use in each nostril as directed 30 mL 11   clobetasol (TEMOVATE) 0.05 % external solution Apply topically.     Docusate Calcium (STOOL SOFTENER PO) Take 1 tablet by mouth daily.     fluticasone (CUTIVATE) 0.05 % cream Apply topically. Apply to affected areas on face up to twice a day as needed     fluticasone (FLONASE) 50 MCG/ACT nasal spray USE 2 SPRAYS IN BOTH  NOSTRILS DAILY 48 g 3   loratadine (CLARITIN) 10 MG tablet Take 1 tablet (10 mg total) by mouth daily.     Multiple Vitamin Essential TABS Take 1 tablet by mouth daily.     omeprazole (PRILOSEC) 20 MG capsule TAKE 1 CAPSULE (20 MG TOTAL) BY MOUTH DAILY. FOR 3 WEEKS THEN AS NEEDED 30 capsule 1   ondansetron (ZOFRAN-ODT) 4 MG disintegrating tablet Take 1 tablet (4 mg total) by mouth every 8 (eight) hours as needed for  nausea or vomiting. 20 tablet 0   polyethylene glycol powder (GLYCOLAX/MIRALAX) 17 GM/SCOOP powder Take 17 g by mouth daily. 3350 g 1   pravastatin (PRAVACHOL) 40 MG tablet Take 1 tablet (40 mg total) by mouth daily. 90 tablet 3   tamsulosin (FLOMAX) 0.4 MG CAPS capsule Take 1 capsule (0.4 mg total) by mouth daily. 90 capsule 3   tiotropium (SPIRIVA HANDIHALER) 18 MCG inhalation capsule INHALE THE CONTENTS OF 1  CAPSULE BY MOUTH VIA  HANDIHALER DAILY AT BEDTIME 90 capsule 3   traZODone (DESYREL) 50 MG tablet TAKE ONE AND ONE-HALF TABLETS BY MOUTH AT BEDTIME AS NEEDED     Current Facility-Administered Medications  Medication Dose Route Frequency Provider Last Rate Last Admin   albuterol (PROVENTIL) (2.5 MG/3ML) 0.083% nebulizer solution 2.5 mg  2.5 mg Nebulization Once Guse, Jacquelynn Cree, FNP       betamethasone acetate-betamethasone sodium phosphate (CELESTONE) injection 3 mg  3 mg Intramuscular Once Edrick Kins, DPM  PHYSICAL EXAMINATION: ECOG PERFORMANCE STATUS: 0 - Asymptomatic  BP 139/75 (Patient Position: Sitting)   Pulse 89   Temp 99.2 F (37.3 C) (Tympanic)   Resp 17   Wt 216 lb (98 kg)   SpO2 99%   BMI 31.90 kg/m   Filed Weights   10/17/21 1317  Weight: 216 lb (98 kg)    Physical Exam Vitals and nursing note reviewed.  HENT:     Head: Normocephalic and atraumatic.     Mouth/Throat:     Pharynx: Oropharynx is clear.  Eyes:     Extraocular Movements: Extraocular movements intact.     Pupils: Pupils are equal, round, and reactive to light.  Cardiovascular:     Rate and Rhythm: Normal rate and regular rhythm.  Pulmonary:     Comments: Decreased breath sounds bilaterally.  Abdominal:     Palpations: Abdomen is soft.  Musculoskeletal:        General: Normal range of motion.     Cervical back: Normal range of motion.  Skin:    General: Skin is warm.  Neurological:     General: No focal deficit present.     Mental Status: He is alert and oriented to person,  place, and time.  Psychiatric:        Behavior: Behavior normal.        Judgment: Judgment normal.       LABORATORY DATA:  I have reviewed the data as listed    Component Value Date/Time   NA 137 09/09/2021 1430   K 3.4 (L) 09/09/2021 1430   CL 101 09/09/2021 1430   CO2 28 09/09/2021 1430   GLUCOSE 103 (H) 09/09/2021 1430   BUN 8 09/09/2021 1430   CREATININE 1.14 09/09/2021 1430   CALCIUM 9.4 09/09/2021 1430   PROT 6.9 09/09/2021 1430   ALBUMIN 4.1 09/09/2021 1430   AST 18 09/09/2021 1430   ALT 14 09/09/2021 1430   ALKPHOS 102 09/09/2021 1430   BILITOT 0.8 09/09/2021 1430   GFRNONAA >60 09/09/2021 1430   GFRAA >60 03/30/2020 0958    No results found for: SPEP, UPEP  Lab Results  Component Value Date   WBC 6.0 09/09/2021   NEUTROABS 3.1 09/09/2021   HGB 15.7 09/09/2021   HCT 47.6 09/09/2021   MCV 75.2 (L) 09/09/2021   PLT 194 09/09/2021      Chemistry      Component Value Date/Time   NA 137 09/09/2021 1430   K 3.4 (L) 09/09/2021 1430   CL 101 09/09/2021 1430   CO2 28 09/09/2021 1430   BUN 8 09/09/2021 1430   CREATININE 1.14 09/09/2021 1430      Component Value Date/Time   CALCIUM 9.4 09/09/2021 1430   ALKPHOS 102 09/09/2021 1430   AST 18 09/09/2021 1430   ALT 14 09/09/2021 1430   BILITOT 0.8 09/09/2021 1430       RADIOGRAPHIC STUDIES: I have personally reviewed the radiological images as listed and agreed with the findings in the report. No results found.   ASSESSMENT & PLAN:  Carcinoid tumor of intestine #Mesenteric mass- heterogeneously coarsely calcified 2.7 x 1.5 cm right mesenteric mass, slightly increased. Adjacent mildly enlarged noncalcified right mesenteric node, new.  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  Small focus of increased uptake within the ventral aspect of the right hemiabdomen localizing to  a loop of small bowel.  NOV 2022 -EGD -duodenal submucosal lesions [not biopsied].   # We will discuss at tumor conference regarding biopsy of the mesenteric mass. Vs. Biopsy with EUS.  Patient not likely a surgical candidate given metastatic disease in the abdomen. Will hold off Sandostatin until after biopsy; cancel nov 18th apt.   #Prognosis: unfortunately malignancy stage IV.  In general patients with carcinoid liver for years.  Patient has nonfunctioning carcinoid-and would not expect to cause any symptoms [see below].  However, Sandostatin could be recommended-to prevent any further growth of disease.    # Dyspepsia- Bloating NOt on improved on Gasex/ Probiotics [previously improved]- Dr.Buccini.  Recommend further evaluation with GI with upper endoscopy-as above carcinoid should not cause any symptoms.  # Smoking: Again reviewed with the patient the ill effects of smoking- including but not limited to cardiac lung and vascular diseases and malignancies.  # DISPOSITION: HOLD off sandostatin # follow up TBD- # cancel appt for 18th- Dr.B       No orders of the defined types were placed in this encounter.   All questions were answered. The patient knows to call the clinic with any problems, questions or concerns.      Cammie Sickle, MD 10/17/2021 1:53 PM

## 2021-10-17 NOTE — Progress Notes (Signed)
Patient here for oncology follow-up appointment,  concerns of headaches and constipation

## 2021-10-17 NOTE — Assessment & Plan Note (Addendum)
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 Adjacent mildly enlarged noncalcified right mesenteric node, new.  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  Small focus of increased uptake within the ventral aspect of the right hemiabdomen localizing to a loop of small bowel.  NOV 2022 -EGD -duodenal submucosal lesions [not biopsied].   # We will discuss at tumor conference regarding biopsy of the mesenteric mass. Vs. Biopsy with EUS.  Patient not likely a surgical candidate given metastatic disease in the abdomen. Will hold off Sandostatin until after biopsy; cancel nov 18th apt.   #Prognosis: unfortunately malignancy stage IV.  In general patients with carcinoid liver for years.  Patient has nonfunctioning carcinoid-and would not expect to cause any symptoms [see below].  However, Sandostatin could be recommended-to prevent any further growth of disease.  Hold off Sandostatin until biopsy is done.  Patient agreement.  # Dyspepsia-?  Gastritis.  Defer to gastroenterology for further evaluation.  # Smoking: Again recommended quitting smoking.  # DISPOSITION: HOLD off sandostatin # follow up TBD- # cancel appt for 18th- Dr.B

## 2021-10-19 ENCOUNTER — Other Ambulatory Visit: Payer: Self-pay

## 2021-10-19 ENCOUNTER — Ambulatory Visit (INDEPENDENT_AMBULATORY_CARE_PROVIDER_SITE_OTHER): Payer: Medicare HMO | Admitting: Family Medicine

## 2021-10-19 ENCOUNTER — Encounter: Payer: Self-pay | Admitting: Family Medicine

## 2021-10-19 VITALS — BP 134/66 | HR 91 | Temp 97.6°F | Ht 69.0 in | Wt 215.0 lb

## 2021-10-19 DIAGNOSIS — F17200 Nicotine dependence, unspecified, uncomplicated: Secondary | ICD-10-CM

## 2021-10-19 DIAGNOSIS — Z77098 Contact with and (suspected) exposure to other hazardous, chiefly nonmedicinal, chemicals: Secondary | ICD-10-CM

## 2021-10-19 DIAGNOSIS — D3A098 Benign carcinoid tumors of other sites: Secondary | ICD-10-CM

## 2021-10-19 DIAGNOSIS — J0110 Acute frontal sinusitis, unspecified: Secondary | ICD-10-CM

## 2021-10-19 MED ORDER — AMOXICILLIN-POT CLAVULANATE 875-125 MG PO TABS: 1.0000 | ORAL_TABLET | Freq: Two times a day (BID) | ORAL | 0 refills | Status: AC

## 2021-10-19 NOTE — Progress Notes (Signed)
Patient ID: Dominic Turner, male    DOB: Jul 21, 1946, 75 y.o.   MRN: [id number]  This visit was conducted in person.  BP 134/66   Pulse 91   Temp 97.6 F (36.4 C) (Temporal)   Ht 5\' 9"  (1.753 m)   Wt 215 lb (97.5 kg)   SpO2 97%   BMI 31.75 kg/m    CC: sinus headache Subjective:   HPI: Dominic Turner is a 75 y.o. male presenting on 10/19/2021 for Headache (C/o sinus HA.    Started over 1 wk ago.  Tried Sudafed, helpful.  Also takes Claritin and Flonase. 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 EGD overall benign.   1+ wk h/o R frontotemporal throbbing sinus pressure headache. Blowing nose with green colored mucous.  No fevers/chills, nasal congestion, cough, ST, ear or tooth pain, PNdrainage.  More nasal congestion in the mornings.   Taking sudafed with temporary benefit. Also uses claritin and flonase.  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 Interim medical history since our last visit reviewed. Allergies and medications reviewed and updated. Outpatient Medications Prior to Visit  Medication Sig Dispense Refill   albuterol (PROVENTIL HFA;VENTOLIN HFA) 108 (90 Base) MCG/ACT inhaler Take 2 puffs in AM & PM every day for next 5 days, & also can use every 4-6 hours as needed. After 5 days, use only every 4-6 hrs as needed 1 Inhaler 0   amLODipine (NORVASC) 10 MG tablet Take 1 tablet (10 mg total) by mouth daily. 90 tablet 3   clobetasol (TEMOVATE) 0.05 % external solution Apply topically.     Docusate Calcium (STOOL SOFTENER PO) Take 1 tablet by mouth daily.     fluticasone (CUTIVATE) 0.05 % cream Apply topically. Apply to affected areas on face up to twice  a day as needed     fluticasone (FLONASE) 50 MCG/ACT nasal spray USE 2 SPRAYS IN BOTH  NOSTRILS DAILY 48 g 3   loratadine (CLARITIN) 10 MG tablet Take 1 tablet (10 mg total) by mouth daily.     Multiple Vitamin Essential TABS Take 1 tablet by mouth daily.     omeprazole (PRILOSEC) 20 MG capsule TAKE 1 CAPSULE (20 MG TOTAL) BY MOUTH DAILY. FOR 3 WEEKS THEN AS NEEDED 30 capsule 1   ondansetron (ZOFRAN-ODT) 4 MG disintegrating tablet Take 1 tablet (4 mg total) by mouth every 8 (eight) hours as needed for nausea or vomiting. 20 tablet 0   polyethylene glycol powder (GLYCOLAX/MIRALAX) 17 GM/SCOOP powder Take 17 g by mouth daily. 3350 g 1   pravastatin (PRAVACHOL) 40 MG tablet Take 1 tablet (40 mg total) by mouth daily. 90 tablet 3   tamsulosin (FLOMAX) 0.4 MG CAPS capsule Take 1 capsule (0.4 mg total) by mouth daily. 90 capsule 3   tiotropium (SPIRIVA HANDIHALER) 18 MCG inhalation capsule INHALE THE CONTENTS OF 1  CAPSULE BY MOUTH VIA  HANDIHALER DAILY AT BEDTIME 90 capsule 3   traZODone (DESYREL) 50 MG tablet TAKE ONE AND ONE-HALF TABLETS BY MOUTH AT BEDTIME AS NEEDED     azelastine (ASTELIN) 0.1 % nasal spray Place 1 spray into both nostrils 2 (two) times daily. Use in each nostril as directed 30 mL  11   Facility-Administered Medications Prior to Visit  Medication Dose Route Frequency Provider Last Rate Last Admin   albuterol (PROVENTIL) (2.5 MG/3ML) 0.083% nebulizer solution 2.5 mg  2.5 mg Nebulization Once Guse, Jacquelynn Cree, FNP       betamethasone acetate-betamethasone sodium phosphate (CELESTONE) injection 3 mg  3 mg Intramuscular Once Edrick Kins, DPM         Per HPI unless specifically indicated in ROS section below Review of Systems  Objective:  BP 134/66   Pulse 91   Temp 97.6 F (36.4 C) (Temporal)   Ht 5\' 9"  (1.753 m)   Wt 215 lb (97.5 kg)   SpO2 97%   BMI 31.75 kg/m   Wt Readings from Last 3 Encounters:  10/19/21 215 lb (97.5 kg)  10/17/21 216 lb (98 kg)  10/12/21 218 lb  14.7 oz (99.3 kg)      Physical Exam Vitals and nursing note reviewed.  Constitutional:      Appearance: Normal appearance. He is well-developed. He is not ill-appearing.  HENT:     Head: Normocephalic and atraumatic.     Right Ear: Tympanic membrane, ear canal and external ear normal. Decreased hearing noted. There is no impacted cerumen.     Left Ear: Tympanic membrane, ear canal and external ear normal. Decreased hearing noted. There is no impacted cerumen.     Ears:     Comments: Did not wear his hearing aides today     Nose: No mucosal edema, congestion or rhinorrhea.     Right Turbinates: Not enlarged or swollen.     Left Turbinates: Not enlarged or swollen.     Right Sinus: Frontal sinus tenderness present. No maxillary sinus tenderness.     Left Sinus: No maxillary sinus tenderness or frontal sinus tenderness.     Mouth/Throat:     Mouth: Mucous membranes are moist.     Pharynx: Oropharynx is clear. No oropharyngeal exudate or posterior oropharyngeal erythema.  Eyes:     Extraocular Movements: Extraocular movements intact.     Conjunctiva/sclera: Conjunctivae normal.     Pupils: Pupils are equal, round, and reactive to light.  Cardiovascular:     Rate and Rhythm: Normal rate and regular rhythm.     Pulses: Normal pulses.     Heart sounds: Normal heart sounds. No murmur heard. Pulmonary:     Effort: Pulmonary effort is normal. No respiratory distress.     Breath sounds: Normal breath sounds. No wheezing, rhonchi or rales.  Musculoskeletal:     Cervical back: Normal range of motion and neck supple. No rigidity.     Right lower leg: No edema.     Left lower leg: No edema.  Lymphadenopathy:     Cervical: No cervical adenopathy.  Skin:    General: Skin is warm and dry.     Findings: No rash.  Neurological:     Mental Status: He is alert.     Cranial Nerves: Cranial nerves 2-12 are intact.     Sensory: Sensation is intact.     Motor: Motor function is intact.      Coordination: Coordination is intact.     Comments:  CN 2-12 intact  Psychiatric:        Mood and Affect: Mood normal.        Behavior: Behavior normal.       Assessment & Plan:  This visit occurred during the SARS-CoV-2 public health emergency.  Safety protocols were in place, including screening  questions prior to the visit, additional usage of staff PPE, and extensive cleaning of exam room while observing appropriate contact time as indicated for disinfecting solutions.   Problem List Items Addressed This Visit     TOBACCO ABUSE    Precontemplative. Doesn't feel could quit at this time with increased stressors.       Carcinoid tumor of intestine    Reviewed course to date of recently found metastatic carcinoid tumor.  Discussing biopsy options.  Appreciate onc care.  Recent EGD/colonoscopy with duodenal mass and TAs - planned EUS in 4 wks.       Acute sinusitis - Primary    Story/exam suspicious for acute sinusitis - treat with augmentin antibiotic 10d course. Further supportive care as per instructions. Update if not improving with treatment.       Relevant Medications   amoxicillin-clavulanate (AUGMENTIN) 875-125 MG tablet   H/O agent Orange exposure     Meds ordered this encounter  Medications   amoxicillin-clavulanate (AUGMENTIN) 875-125 MG tablet    Sig: Take 1 tablet by mouth 2 (two) times daily for 10 days.    Dispense:  20 tablet    Refill:  0    No orders of the defined types were placed in this encounter.   Patient Instructions  You likely have a sinus infection.  Take medicine as prescribed: augmentin twice daily for 10 days Push fluids and plenty of rest.  Nasal saline irrigation or neti pot to help drain sinuses. May use plain mucinex with plenty of fluid to help mobilize mucous. Please let us know if fever >101.5, trouble opening/closing mouth, difficulty swallowing, or worsening instead of improving as expected.   Follow up plan: Return if  symptoms worsen or fail to improve.  Ria Bush, MD

## 2021-10-19 NOTE — Patient Instructions (Addendum)
You likely have a sinus infection.  Take medicine as prescribed: augmentin twice daily for 10 days Push fluids and plenty of rest.  Nasal saline irrigation or neti pot to help drain sinuses. May use plain mucinex with plenty of fluid to help mobilize mucous. Please let us know if fever >101.5, trouble opening/closing mouth, difficulty swallowing, or worsening instead of improving as expected.

## 2021-10-20 ENCOUNTER — Encounter: Payer: Self-pay | Admitting: Family Medicine

## 2021-10-20 ENCOUNTER — Other Ambulatory Visit: Payer: Medicare HMO

## 2021-10-20 ENCOUNTER — Telehealth: Payer: Self-pay | Admitting: Internal Medicine

## 2021-10-20 DIAGNOSIS — J019 Acute sinusitis, unspecified: Secondary | ICD-10-CM | POA: Insufficient documentation

## 2021-10-20 DIAGNOSIS — Z77098 Contact with and (suspected) exposure to other hazardous, chiefly nonmedicinal, chemicals: Secondary | ICD-10-CM | POA: Insufficient documentation

## 2021-10-20 NOTE — Assessment & Plan Note (Addendum)
Reviewed course to date of recently found metastatic carcinoid tumor.  Discussing biopsy options.  Appreciate onc care.  Recent EGD/colonoscopy with duodenal mass and TAs - planned EUS in 4 wks.

## 2021-10-20 NOTE — Assessment & Plan Note (Signed)
Precontemplative. Doesn't feel could quit at this time with increased stressors.

## 2021-10-20 NOTE — Assessment & Plan Note (Signed)
Story/exam suspicious for acute sinusitis - treat with augmentin antibiotic 10d course. Further supportive care as per instructions. Update if not improving with treatment.

## 2021-10-20 NOTE — Telephone Encounter (Signed)
Spoke to patient regarding endoscopic ultrasound; he is in agreement with the plan.    K- please follow up. Thanks  GB

## 2021-10-20 NOTE — Progress Notes (Signed)
Tumor Board Documentation  Dominic Turner was presented by Dr Rogue Bussing at our Tumor Board on 10/20/2021, which included representatives from medical oncology, radiation oncology, surgical, radiology, pathology, navigation, internal medicine, pharmacy, research, genetics.  Dominic Turner currently presents as a current patient, for discussion with history of the following treatments: surgical intervention(s), active survellience (Sandodtatin).  Additionally, we reviewed previous medical and familial history, history of present illness, and recent lab results along with all available histopathologic and imaging studies. The tumor board considered available treatment options and made the following recommendations: Biopsy (EUS guided)    The following procedures/referrals were also placed: No orders of the defined types were placed in this encounter.   Clinical Trial Status: not discussed   Staging used: To be determined AJCC Staging:       Group: Carcinoid Tumor    National site-specific guidelines   were discussed with respect to the case.  Tumor board is a meeting of clinicians from various specialty areas who evaluate and discuss patients for whom a multidisciplinary approach is being considered. Final determinations in the plan of care are those of the provider(s). The responsibility for follow up of recommendations given during tumor board is that of the provider.   Today's extended care, comprehensive team conference, Dominic Turner was not present for the discussion and was not examined.   Multidisciplinary Tumor Board is a multidisciplinary case peer review process.  Decisions discussed in the Multidisciplinary Tumor Board reflect the opinions of the specialists present at the conference without having examined the patient.  Ultimately, treatment and diagnostic decisions rest with the primary provider(s) and the patient.

## 2021-10-21 NOTE — Telephone Encounter (Signed)
Case was sent to [place] for review. Tentative date 11/10/21

## 2021-10-24 ENCOUNTER — Telehealth: Payer: Self-pay | Admitting: *Deleted

## 2021-10-24 ENCOUNTER — Encounter: Payer: Self-pay | Admitting: Family Medicine

## 2021-10-24 NOTE — Telephone Encounter (Signed)
I have reached out to Mr. Mach and he is aware we are awaiting approval of EUS by [facility]. Tentative date is 11/10/2021. Once date confirmed we will arrange follow up with Dr. Rogue Bussing.

## 2021-10-24 NOTE — Telephone Encounter (Signed)
Patient called asking if the injections that he was to get is still on or what the plan is. Please return his call to discuss treatment plan. I do see that he was referred t [facility] for GI and possible EUS.

## 2021-10-24 NOTE — Telephone Encounter (Signed)
10/17/21 MD note:  We will discuss at tumor conference regarding biopsy of the mesenteric mass. Vs. Biopsy with EUS.  Patient not likely a surgical candidate given metastatic disease in the abdomen. Will hold off Sandostatin until after biopsy; cancel nov 18th apt.   Will need to confirm with MD if there is a new plan in place at this time.

## 2021-10-26 ENCOUNTER — Other Ambulatory Visit: Payer: Self-pay

## 2021-10-26 ENCOUNTER — Telehealth: Payer: Self-pay

## 2021-10-26 NOTE — Telephone Encounter (Signed)
EUS has been scheduled for 11/10/21. Called and went over instructions for EUS and educated further on procedure. All questions answered and copy of instructions mailed to confirmed address.

## 2021-10-27 ENCOUNTER — Encounter: Payer: Self-pay | Admitting: Family Medicine

## 2021-10-27 ENCOUNTER — Telehealth: Payer: Self-pay | Admitting: Family Medicine

## 2021-10-27 NOTE — Telephone Encounter (Signed)
Spoke with pt getting more details of sxs.  States he has real bad diarrhea.  Denies nausea/vomiting.  Recommended he not take any more doses.  Pt verbalizes understanding and states his last dose was yesterday.  Still has diarrhea but not as bad.  Plz advise.

## 2021-10-27 NOTE — Telephone Encounter (Signed)
Agree with recommendation to stop augmentin. He completed 6 days of antibiotic and sinus symptoms are improved.

## 2021-10-27 NOTE — Telephone Encounter (Addendum)
Called pt.  Would hold abx for now.  Sinus sx have resolved.  If GI sx not improving, then he can let us know.  He could try a probiotic but giving it a little more time off abx is likely the best option.  He doesn't have blood or mucous in stool.  Routine cautions given to patient.  Routed to PCP as FYI. Pt agrees with plan.

## 2021-10-27 NOTE — Telephone Encounter (Signed)
Pt called in stated that the RX amoxicillin-clavulanate (AUGMENTIN) 875-125 MG tablet has be causing stomach problems he has 4 pills left and wants to know should he stop taking the medication . Please advise [phone]

## 2021-10-28 ENCOUNTER — Other Ambulatory Visit: Payer: Medicare HMO

## 2021-10-28 ENCOUNTER — Ambulatory Visit: Payer: Medicare HMO | Admitting: Internal Medicine

## 2021-10-28 ENCOUNTER — Ambulatory Visit: Payer: Medicare HMO

## 2021-10-30 ENCOUNTER — Other Ambulatory Visit: Payer: Self-pay | Admitting: Family Medicine

## 2021-10-31 ENCOUNTER — Encounter: Payer: Self-pay | Admitting: Hospice and Palliative Medicine

## 2021-10-31 ENCOUNTER — Other Ambulatory Visit: Payer: Self-pay

## 2021-10-31 ENCOUNTER — Inpatient Hospital Stay: Payer: Medicare HMO

## 2021-10-31 ENCOUNTER — Inpatient Hospital Stay (HOSPITAL_BASED_OUTPATIENT_CLINIC_OR_DEPARTMENT_OTHER): Payer: Medicare HMO | Admitting: Hospice and Palliative Medicine

## 2021-10-31 ENCOUNTER — Telehealth: Payer: Self-pay | Admitting: *Deleted

## 2021-10-31 VITALS — BP 139/76 | HR 82 | Temp 98.9°F | Resp 20 | Wt 206.3 lb

## 2021-10-31 DIAGNOSIS — Z6831 Body mass index (BMI) 31.0-31.9, adult: Secondary | ICD-10-CM | POA: Diagnosis not present

## 2021-10-31 DIAGNOSIS — R197 Diarrhea, unspecified: Secondary | ICD-10-CM | POA: Diagnosis not present

## 2021-10-31 DIAGNOSIS — R1013 Epigastric pain: Secondary | ICD-10-CM | POA: Diagnosis not present

## 2021-10-31 DIAGNOSIS — D3A098 Benign carcinoid tumors of other sites: Secondary | ICD-10-CM

## 2021-10-31 DIAGNOSIS — C7A8 Other malignant neuroendocrine tumors: Secondary | ICD-10-CM | POA: Diagnosis not present

## 2021-10-31 DIAGNOSIS — F17210 Nicotine dependence, cigarettes, uncomplicated: Secondary | ICD-10-CM | POA: Diagnosis not present

## 2021-10-31 DIAGNOSIS — K5730 Diverticulosis of large intestine without perforation or abscess without bleeding: Secondary | ICD-10-CM | POA: Diagnosis not present

## 2021-10-31 DIAGNOSIS — C7B8 Other secondary neuroendocrine tumors: Secondary | ICD-10-CM | POA: Diagnosis not present

## 2021-10-31 DIAGNOSIS — I700 Atherosclerosis of aorta: Secondary | ICD-10-CM | POA: Diagnosis not present

## 2021-10-31 DIAGNOSIS — I2510 Atherosclerotic heart disease of native coronary artery without angina pectoris: Secondary | ICD-10-CM | POA: Diagnosis not present

## 2021-10-31 DIAGNOSIS — N400 Enlarged prostate without lower urinary tract symptoms: Secondary | ICD-10-CM | POA: Diagnosis not present

## 2021-10-31 LAB — COMPREHENSIVE METABOLIC PANEL
ALT: 25 U/L (ref 0–44)
AST: 19 U/L (ref 15–41)
Albumin: 3.7 g/dL (ref 3.5–5.0)
Alkaline Phosphatase: 107 U/L (ref 38–126)
Anion gap: 9 (ref 5–15)
BUN: 12 mg/dL (ref 8–23)
CO2: 27 mmol/L (ref 22–32)
Calcium: 8.9 mg/dL (ref 8.9–10.3)
Chloride: 101 mmol/L (ref 98–111)
Creatinine, Ser: 1.04 mg/dL (ref 0.61–1.24)
GFR, Estimated: >60 mL/min (ref >60)
Glucose, Bld: 108 mg/dL — ABNORMAL HIGH (ref 70–99)
Potassium: 3.1 mmol/L — ABNORMAL LOW (ref 3.5–5.1)
Sodium: 137 mmol/L (ref 135–145)
Total Bilirubin: 1 mg/dL (ref 0.3–1.2)
Total Protein: 6.3 g/dL — ABNORMAL LOW (ref 6.5–8.1)

## 2021-10-31 LAB — MAGNESIUM: Magnesium: 1.9 mg/dL (ref 1.7–2.4)

## 2021-10-31 LAB — CBC WITH DIFFERENTIAL/PLATELET
Abs Immature Granulocytes: 0.03 10*3/uL (ref 0.00–0.07)
Basophils Absolute: 0.1 10*3/uL (ref 0.0–0.1)
Basophils Relative: 1 %
Eosinophils Absolute: 0.3 10*3/uL (ref 0.0–0.5)
Eosinophils Relative: 4 %
HCT: 45.8 % (ref 39.0–52.0)
Hemoglobin: 15 g/dL (ref 13.0–17.0)
Immature Granulocytes: 0 %
Lymphocytes Relative: 24 %
Lymphs Abs: 1.7 10*3/uL (ref 0.7–4.0)
MCH: 24.1 pg — ABNORMAL LOW (ref 26.0–34.0)
MCHC: 32.8 g/dL (ref 30.0–36.0)
MCV: 73.5 fL — ABNORMAL LOW (ref 80.0–100.0)
Monocytes Absolute: 0.9 10*3/uL (ref 0.1–1.0)
Monocytes Relative: 12 %
Neutro Abs: 4.4 10*3/uL (ref 1.7–7.7)
Neutrophils Relative %: 59 %
Platelets: 199 10*3/uL (ref 150–400)
RBC: 6.23 MIL/uL — ABNORMAL HIGH (ref 4.22–5.81)
RDW: 17.7 % — ABNORMAL HIGH (ref 11.5–15.5)
WBC: 7.4 10*3/uL (ref 4.0–10.5)
nRBC: 0 % (ref 0.0–0.2)

## 2021-10-31 MED ORDER — SODIUM CHLORIDE 0.9 % IV SOLN
INTRAVENOUS | Status: DC
  Filled 2021-10-31 (×2): qty 250

## 2021-10-31 MED ORDER — POTASSIUM CHLORIDE CRYS ER 20 MEQ PO TBCR: 40.0000 meq | EXTENDED_RELEASE_TABLET | Freq: Every day | ORAL | 0 refills | Status: DC

## 2021-10-31 NOTE — Telephone Encounter (Signed)
Patient called to report that he is having severe diarrhea he has had 5 episodes this morning. He is being treated for a sinus infection with amoxicillin which he thinks triggered the diarrhea. His PCP advised him to stop ABT and to take Imodium yesterday. He states that he is no better today. He also reports no appetite and has been unable to eat for the past few days. He is afebrile and is able to take in small amounts of liquid.

## 2021-10-31 NOTE — Progress Notes (Signed)
Patient states he is taking an antibiotics and have been taking it for the last 7 days which is when the diarrhea started.He called his pcp and they told him to stop taking the antibiotic and it should stop his upset stomach. Patient states when he drink or eat food he knows the diarrhea going to come. This morning patient has had 5 bowel movements.     Started antibiotics for a sinus headache.

## 2021-10-31 NOTE — Progress Notes (Signed)
Symptom Management [place]  [phone] [phone]  Patient Care Team: Ria Bush, MD as PCP - General [name], MD as Consulting Physician (Gastroenterology)   Name of the patient: Dominic Turner  [id number]  1946/06/25   Date of visit: 10/31/21  Reason for Consult: Mr. 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  Patient last saw Dr. Rogue Bussing on 10/17/2021 at which time treatment options were being discussed.  Patient had dyspepsia and was referred to GI for consideration of upper endoscopy.  There was not felt that patient's carcinoid would be contributing to GI distress.  Patient was started on Augmentin last week for sinusitis.  He subsequently developed diarrhea and Augmentin was discontinued.  He was recommended to start a probiotic by PCP.  Patient presents to [facility] today for evaluation of diarrhea.  He reports that diarrhea started shortly after he initiated Augmentin last week.  He last took Augmentin a couple of days ago but diarrhea has persisted.  Describes stools as being "loose" but not watery.  Patient is having several bowel movements daily that seem to be triggered with eating.  No blood or mucus reported.  No abdominal pain or distention.  No nausea or vomiting.  No fever or chills.  Patient does endorse fatigue but this has been persistent since his cancer diagnosis.  Denies any neurologic complaints. Denies any easy bleeding or bruising.  Denies chest pain. Denies any nausea, vomiting, constipation. Denies urinary complaints. Patient offers no further specific complaints today.  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 Post bronchodilator test not improved.   Diverticulosis 02/2013   by colonoscopy   Essential hypertension, benign    History of adenomatous polyp of colon 02/2013   rec rpt 3 yrs   Overweight(278.02)    Positive PPD, treated 2014   s/p rifampin x37mo   Pure hypercholesterolemia    Tobacco use disorder    Unspecified gastritis and gastroduodenitis without mention of hemorrhage    Unspecified sleep apnea     PAST SURGICAL HISTORY:  Past Surgical History:  Procedure Laterality Date   CATARACT EXTRACTION, BILATERAL Bilateral    CHOLECYSTECTOMY N/A 12/30/2015   Procedure: LAPAROSCOPIC CHOLECYSTECTOMY;  Surgeon: Florene Glen, MD;  Location: [facility] ORS;  Service: General;  Laterality: N/A;   COLONOSCOPY  03/03/2013   tubular adenoma x3, diverticulosis, rpt 3 yrs (Dr. Cristina Gong)   COLONOSCOPY  05/2016   TA x3, diverticulosis, single angiodysplastic lesion, rpt 3 yrs (Buccini)   COLONOSCOPY  09/2019   TA, HP, rpt 5 yrs (Buccini)   COLONOSCOPY WITH PROPOFOL N/A 10/12/2021   multiple TA, rpt 3 yrs Vicente Males, Bailey Mech, MD)   ESOPHAGOGASTRODUODENOSCOPY N/A 10/12/2021   chronic gastritis with + H pylori (treated with clarithromycin, amoxicillin, omeprazole x2wks), intestinal metaplasia present rpt 6 months [name], MD)    HEMATOLOGY/ONCOLOGY HISTORY:  Oncology History   No history exists.    ALLERGIES:  is allergic to aspirin, sertraline hcl, sulfonamide derivatives, and wellbutrin [bupropion].  MEDICATIONS:  Current Outpatient Medications  Medication Sig Dispense Refill   albuterol (  PROVENTIL HFA;VENTOLIN HFA) 108 (90 Base) MCG/ACT inhaler Take 2 puffs in AM & PM every day for next 5 days, & also can use every 4-6 hours as needed. After 5 days, use only every 4-6 hrs as needed 1 Inhaler 0   amLODipine (NORVASC) 10 MG tablet Take 1 tablet  (10 mg total) by mouth daily. 90 tablet 3   clobetasol (TEMOVATE) 0.05 % external solution Apply topically.     Docusate Calcium (STOOL SOFTENER PO) Take 1 tablet by mouth daily.     fluticasone (CUTIVATE) 0.05 % cream Apply topically. Apply to affected areas on face up to twice a day as needed     fluticasone (FLONASE) 50 MCG/ACT nasal spray USE 2 SPRAYS IN BOTH  NOSTRILS DAILY 48 g 3   loratadine (CLARITIN) 10 MG tablet Take 1 tablet (10 mg total) by mouth daily.     Multiple Vitamin Essential TABS Take 1 tablet by mouth daily.     omeprazole (PRILOSEC) 20 MG capsule TAKE 1 CAPSULE (20 MG TOTAL) BY MOUTH DAILY. FOR 3 WEEKS THEN AS NEEDED 30 capsule 1   ondansetron (ZOFRAN-ODT) 4 MG disintegrating tablet Take 1 tablet (4 mg total) by mouth every 8 (eight) hours as needed for nausea or vomiting. 20 tablet 0   polyethylene glycol powder (GLYCOLAX/MIRALAX) 17 GM/SCOOP powder Take 17 g by mouth daily. 3350 g 1   pravastatin (PRAVACHOL) 40 MG tablet Take 1 tablet (40 mg total) by mouth daily. 90 tablet 3   tamsulosin (FLOMAX) 0.4 MG CAPS capsule Take 1 capsule (0.4 mg total) by mouth daily. 90 capsule 3   tiotropium (SPIRIVA HANDIHALER) 18 MCG inhalation capsule INHALE THE CONTENTS OF 1  CAPSULE BY MOUTH VIA  HANDIHALER DAILY AT BEDTIME 90 capsule 3   traZODone (DESYREL) 50 MG tablet TAKE ONE AND ONE-HALF TABLETS BY MOUTH AT BEDTIME AS NEEDED     Current Facility-Administered Medications  Medication Dose Route Frequency Provider Last Rate Last Admin   albuterol (PROVENTIL) (2.5 MG/3ML) 0.083% nebulizer solution 2.5 mg  2.5 mg Nebulization Once Guse, Jacquelynn Cree, FNP       betamethasone acetate-betamethasone sodium phosphate (CELESTONE) injection 3 mg  3 mg Intramuscular Once Edrick Kins, DPM        VITAL SIGNS: There were no vitals taken for this visit. There were no vitals filed for this visit.  Estimated body mass index is 31.75 kg/m as calculated from the following:   Height as of 10/19/21:  5\' 9"  (1.753 m).   Weight as of 10/19/21: 215 lb (97.5 kg).  LABS: CBC:    Component Value Date/Time   WBC 6.0 09/09/2021 1430   HGB 15.7 09/09/2021 1430   HCT 47.6 09/09/2021 1430   PLT 194 09/09/2021 1430   MCV 75.2 (L) 09/09/2021 1430   NEUTROABS 3.1 09/09/2021 1430   LYMPHSABS 2.0 09/09/2021 1430   MONOABS 0.5 09/09/2021 1430   EOSABS 0.3 09/09/2021 1430   BASOSABS 0.1 09/09/2021 1430   Comprehensive Metabolic Panel:    Component Value Date/Time   NA 137 09/09/2021 1430   K 3.4 (L) 09/09/2021 1430   CL 101 09/09/2021 1430   CO2 28 09/09/2021 1430   BUN 8 09/09/2021 1430   CREATININE 1.14 09/09/2021 1430   GLUCOSE 103 (H) 09/09/2021 1430   CALCIUM 9.4 09/09/2021 1430   AST 18 09/09/2021 1430   ALT 14 09/09/2021 1430   ALKPHOS 102 09/09/2021 1430   BILITOT 0.8 09/09/2021 1430   PROT 6.9 09/09/2021  1430   ALBUMIN 4.1 09/09/2021 1430    RADIOGRAPHIC STUDIES: MR ENTERO ABDOMEN W WO CONTRAST  Result Date: 10/10/2021 CLINICAL DATA:  History of neuroendocrine tumor receptor avid tumor within the small bowel mesentery. EXAM: MR ABDOMEN AND PELVIS WITHOUT AND WITH CONTRAST (MR ENTEROGRAPHY) TECHNIQUE: Multiplanar, multisequence MRI of the abdomen and pelvis was performed both before and during bolus administration of intravenous contrast. Negative oral contrast VoLumen was given. CONTRAST:  34mL GADAVIST GADOBUTROL 1 MMOL/ML IV SOLN COMPARISON:  PET-CT from 09/14/2021 asymmetric elevation FINDINGS: COMBINED FINDINGS FOR BOTH MR ABDOMEN AND PELVIS Exam detail is diminished due to motion artifact Lower chest: Asymmetric elevation of the right hemidiaphragm is again noted. No acute findings within the imaged portions of the lung bases. Hepatobiliary: Scattered liver cysts are identified. The largest is in the left lobe of liver containing internal septation measuring 2.7 cm. Six signs of previous cholecystectomy. No bile duct dilatation. Pancreas: No mass, inflammatory changes, or  other parenchymal abnormality identified. Spleen:  Within normal limits in size and appearance. Adrenals/Urinary Tract: Normal adrenal glands. No kidney mass or hydronephrosis. Urinary bladder is unremarkable. Stomach/Bowel: Stomach appears normal. The appendix is visualized and is within normal limits. No bowel wall thickening, inflammation, or distension. Unfortunately, due to motion artifact the postcontrast images of the large and small bowel loops are significantly degraded by motion. No obstructing mass identified.No focal small bowel abnormality identified correspond with the focal small bowel uptake noted on the previous PET-CT. Vascular/Lymphatic: Normal caliber of the abdominal aorta. Within the ileocolic mesentery there is a enhancing mass measuring 2.6 x 1.6 cm which corresponds with the neuroendocrine receptor avid tumor identified on recent PET-CT, image 90/23. No enlarged retroperitoneal lymph nodes. No pelvic or inguinal adenopathy. Reproductive: Prostate is unremarkable. Other:  No free fluid or fluid collections identified Musculoskeletal: No suspicious bone lesions identified. IMPRESSION: 1. Exam detail is diminished due to motion artifact. 2. Enhancing mass within the ileocolic mesentery corresponds with the neuroendocrine receptor avid tumor identified on recent PET-CT. 3. No focal small bowel abnormality identified correspond with the focal small bowel uptake noted on previous PET-CT. Electronically Signed   By: Kerby Moors M.D.   On: 10/10/2021 21:03   MR ENTERO PELVIS W WO CONTRAST  Result Date: 10/10/2021 CLINICAL DATA:  History of neuroendocrine tumor receptor avid tumor within the small bowel mesentery. EXAM: MR ABDOMEN AND PELVIS WITHOUT AND WITH CONTRAST (MR ENTEROGRAPHY) TECHNIQUE: Multiplanar, multisequence MRI of the abdomen and pelvis was performed both before and during bolus administration of intravenous contrast. Negative oral contrast VoLumen was given. CONTRAST:  62mL  GADAVIST GADOBUTROL 1 MMOL/ML IV SOLN COMPARISON:  PET-CT from 09/14/2021 asymmetric elevation FINDINGS: COMBINED FINDINGS FOR BOTH MR ABDOMEN AND PELVIS Exam detail is diminished due to motion artifact Lower chest: Asymmetric elevation of the right hemidiaphragm is again noted. No acute findings within the imaged portions of the lung bases. Hepatobiliary: Scattered liver cysts are identified. The largest is in the left lobe of liver containing internal septation measuring 2.7 cm. Six signs of previous cholecystectomy. No bile duct dilatation. Pancreas: No mass, inflammatory changes, or other parenchymal abnormality identified. Spleen:  Within normal limits in size and appearance. Adrenals/Urinary Tract: Normal adrenal glands. No kidney mass or hydronephrosis. Urinary bladder is unremarkable. Stomach/Bowel: Stomach appears normal. The appendix is visualized and is within normal limits. No bowel wall thickening, inflammation, or distension. Unfortunately, due to motion artifact the postcontrast images of the large and small bowel loops are significantly degraded by  motion. No obstructing mass identified.No focal small bowel abnormality identified correspond with the focal small bowel uptake noted on the previous PET-CT. Vascular/Lymphatic: Normal caliber of the abdominal aorta. Within the ileocolic mesentery there is a enhancing mass measuring 2.6 x 1.6 cm which corresponds with the neuroendocrine receptor avid tumor identified on recent PET-CT, image 90/23. No enlarged retroperitoneal lymph nodes. No pelvic or inguinal adenopathy. Reproductive: Prostate is unremarkable. Other:  No free fluid or fluid collections identified Musculoskeletal: No suspicious bone lesions identified. IMPRESSION: 1. Exam detail is diminished due to motion artifact. 2. Enhancing mass within the ileocolic mesentery corresponds with the neuroendocrine receptor avid tumor identified on recent PET-CT. 3. No focal small bowel abnormality  identified correspond with the focal small bowel uptake noted on previous PET-CT. Electronically Signed   By: Kerby Moors M.D.   On: 10/10/2021 21:03    PERFORMANCE STATUS (ECOG) : 1 - Symptomatic but completely ambulatory  Review of Systems Unless otherwise noted, a complete review of systems is negative.  Physical Exam General: NAD Cardiovascular: regular rate and rhythm Pulmonary: clear ant fields Abdomen: soft, nontender, + bowel sounds GU: no suprapubic tenderness Extremities: no edema, no joint deformities Skin: no rashes Neurological: Weakness but otherwise nonfocal  Assessment and Plan- Patient is a 75 y.o. 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  Patient presents to [facility] for evaluation of diarrhea that started after he initiated Augmentin last week   Diarrhea -this is most likely secondary to antibiotic use.  However, will send stool sample for GI panel and C. difficile PCR to rule out infectious etiology.  If GI panel negative, would recommend supportive care with antidiarrheals.  Discussed importance of increasing fluid intake and suggested Pedialyte.  We will give him IV fluids today in clinic.  Hypokalemia -likely secondary to GI loss.  Magnesium WNL.  We will start oral KCl supplementation. Will bring him back next week for repeat BMP.   Case and plan discussed with Dr. Rogue Bussing.  Patient RTC as needed.  Patient expressed understanding and was in agreement with this plan. He also understands that He can call clinic at any time with any questions, concerns, or complaints.   Thank you for allowing me to participate in the care of this very pleasant patient.   Time Total: 20 minutes  Visit consisted of counseling and education dealing with the complex and emotionally intense issues of symptom management in the setting of serious illness.Greater than 50%  of this time was spent counseling and coordinating  care related to the above assessment and plan.  Signed by: Altha Harm, PhD, NP-C

## 2021-10-31 NOTE — Telephone Encounter (Signed)
Called and spoke to patient. He is willing to come in to [facility] this afternoon for labs/evaluation/poss IV fluids. Appointments scheduled.

## 2021-11-01 ENCOUNTER — Other Ambulatory Visit: Payer: Self-pay

## 2021-11-01 ENCOUNTER — Telehealth: Payer: Self-pay

## 2021-11-01 ENCOUNTER — Telehealth: Payer: Self-pay | Admitting: *Deleted

## 2021-11-01 DIAGNOSIS — N400 Enlarged prostate without lower urinary tract symptoms: Secondary | ICD-10-CM | POA: Diagnosis not present

## 2021-11-01 DIAGNOSIS — C7A8 Other malignant neuroendocrine tumors: Secondary | ICD-10-CM | POA: Diagnosis not present

## 2021-11-01 DIAGNOSIS — C7B8 Other secondary neuroendocrine tumors: Secondary | ICD-10-CM | POA: Diagnosis not present

## 2021-11-01 DIAGNOSIS — R1013 Epigastric pain: Secondary | ICD-10-CM | POA: Diagnosis not present

## 2021-11-01 DIAGNOSIS — Z6831 Body mass index (BMI) 31.0-31.9, adult: Secondary | ICD-10-CM | POA: Diagnosis not present

## 2021-11-01 DIAGNOSIS — I700 Atherosclerosis of aorta: Secondary | ICD-10-CM | POA: Diagnosis not present

## 2021-11-01 DIAGNOSIS — R197 Diarrhea, unspecified: Secondary | ICD-10-CM

## 2021-11-01 DIAGNOSIS — F17210 Nicotine dependence, cigarettes, uncomplicated: Secondary | ICD-10-CM | POA: Diagnosis not present

## 2021-11-01 DIAGNOSIS — K5730 Diverticulosis of large intestine without perforation or abscess without bleeding: Secondary | ICD-10-CM | POA: Diagnosis not present

## 2021-11-01 DIAGNOSIS — I2510 Atherosclerotic heart disease of native coronary artery without angina pectoris: Secondary | ICD-10-CM | POA: Diagnosis not present

## 2021-11-01 LAB — GASTROINTESTINAL PANEL BY PCR, STOOL (REPLACES STOOL CULTURE)

## 2021-11-01 LAB — C DIFFICILE QUICK SCREEN W PCR REFLEX
C Diff antigen: POSITIVE — AB
C Diff interpretation: DETECTED
C Diff toxin: POSITIVE — AB

## 2021-11-01 MED ORDER — CLARITHROMYCIN 500 MG PO TABS: 500.0000 mg | ORAL_TABLET | Freq: Two times a day (BID) | ORAL | 0 refills | Status: AC

## 2021-11-01 MED ORDER — OMEPRAZOLE 20 MG PO CPDR: 20.0000 mg | DELAYED_RELEASE_CAPSULE | Freq: Two times a day (BID) | ORAL | 0 refills | Status: AC

## 2021-11-01 MED ORDER — VANCOMYCIN HCL 125 MG PO CAPS: 125.0000 mg | ORAL_CAPSULE | Freq: Four times a day (QID) | ORAL | 0 refills | Status: DC

## 2021-11-01 MED ORDER — AMOXICILLIN 500 MG PO CAPS: 500.0000 mg | ORAL_CAPSULE | Freq: Three times a day (TID) | ORAL | 0 refills | Status: AC

## 2021-11-01 NOTE — Telephone Encounter (Signed)
Call placed to Dominic Turner to follow up on education for EUS. He has not received instructions in the mail. He is coming to the cancer center today. Copy printed and placed at the front desk for him. He was instructed to call with any questions.

## 2021-11-01 NOTE — Telephone Encounter (Signed)
I had sent a message to Altha Harm, NP and explained what was recommended for the patient. However, after including Dr. Rogue Bussing and Dr. Allen Norris in our secure chat, they all agreed for patient to start taking Vancomycin to treat his C-Diff first and to hold his H Pylori treatment once Dr. Vicente Males gave me the green light.  I then called patient and explained everything to him so he is not confused of what medication to take. Patient finally understood and stated that he would only take the Vancomycin and finish it all and that he would wait until I reach out to him as when he could start his H Pylori treatment. I then let all of the other providers know that patient ws contacted by me and that he understood what needed to be done.

## 2021-11-01 NOTE — Telephone Encounter (Signed)
Lab called that stool is positive for C Diff

## 2021-11-01 NOTE — Telephone Encounter (Signed)
Patient was contacted and informed him of the below information. I told him that I saw in his chart that he had seen Altha Harm, NP from the cancer center and prescribed him Vancomycin due to his diarrhea. However, I told him that Dr. Vicente Males wanted him to start taking a different antibiotic and a PPI since his pathology showed that he had H Pylori gastritis. I was able to explain to the patient what it meant and why he needed another type of antibiotics. Therefore, I told him to stop what Mr. Regenia Skeeter, NP had prescribed him and start what Dr. Vicente Males prescribed. Patient understood and stated that he would do as what Dr. Vicente Males recommended. Patient had no further questions. I will let Mr. Altha Harm, NP know so he is in the loop of the patient's care.

## 2021-11-01 NOTE — Telephone Encounter (Signed)
C difficile PCR +. Dicussed with patient. Will start him on oral vanc 125mg  Q6H x 10 days. All questions answered.

## 2021-11-01 NOTE — Telephone Encounter (Signed)
-----   Message from [name], MD sent at 10/24/2021  8:51 AM EST ----- Herb Grays inform    H pylori gastritis.   Suggest clarithromycin 500 mg PO BID, amoxicillin 1 gram TID, omeprazole 20 mg BID all for 14 days.  , will need repeat H pylori stool antigen to check for eradication after .    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

## 2021-11-02 ENCOUNTER — Telehealth: Payer: Self-pay | Admitting: *Deleted

## 2021-11-02 ENCOUNTER — Telehealth: Payer: Self-pay

## 2021-11-02 NOTE — Telephone Encounter (Signed)
Patient called reporting that the prescription sent in for him is over $1000 and he cannot afford that. I called pharmacy because I am aware that the prior auth for this was just approved this morning and was told that the cost is $95 Call returned to patient and advised of this and he will go get his medicine this morning

## 2021-11-02 NOTE — Telephone Encounter (Signed)
Prior authorization for Vancomycin approved via Cover My Meds (Key: [id number]), pharmacy notified.

## 2021-11-07 ENCOUNTER — Other Ambulatory Visit: Payer: Self-pay

## 2021-11-07 ENCOUNTER — Inpatient Hospital Stay: Payer: Medicare HMO

## 2021-11-07 DIAGNOSIS — F17210 Nicotine dependence, cigarettes, uncomplicated: Secondary | ICD-10-CM | POA: Diagnosis not present

## 2021-11-07 DIAGNOSIS — K5730 Diverticulosis of large intestine without perforation or abscess without bleeding: Secondary | ICD-10-CM | POA: Diagnosis not present

## 2021-11-07 DIAGNOSIS — R197 Diarrhea, unspecified: Secondary | ICD-10-CM

## 2021-11-07 DIAGNOSIS — C7B8 Other secondary neuroendocrine tumors: Secondary | ICD-10-CM | POA: Diagnosis not present

## 2021-11-07 DIAGNOSIS — I2510 Atherosclerotic heart disease of native coronary artery without angina pectoris: Secondary | ICD-10-CM | POA: Diagnosis not present

## 2021-11-07 DIAGNOSIS — N400 Enlarged prostate without lower urinary tract symptoms: Secondary | ICD-10-CM | POA: Diagnosis not present

## 2021-11-07 DIAGNOSIS — I700 Atherosclerosis of aorta: Secondary | ICD-10-CM | POA: Diagnosis not present

## 2021-11-07 DIAGNOSIS — R1013 Epigastric pain: Secondary | ICD-10-CM | POA: Diagnosis not present

## 2021-11-07 DIAGNOSIS — Z6831 Body mass index (BMI) 31.0-31.9, adult: Secondary | ICD-10-CM | POA: Diagnosis not present

## 2021-11-07 DIAGNOSIS — C7A8 Other malignant neuroendocrine tumors: Secondary | ICD-10-CM | POA: Diagnosis not present

## 2021-11-07 LAB — BASIC METABOLIC PANEL
Anion gap: 8 (ref 5–15)
BUN: 7 mg/dL — ABNORMAL LOW (ref 8–23)
CO2: 30 mmol/L (ref 22–32)
Calcium: 9.4 mg/dL (ref 8.9–10.3)
Chloride: 102 mmol/L (ref 98–111)
Creatinine, Ser: 1.05 mg/dL (ref 0.61–1.24)
GFR, Estimated: >60 mL/min (ref >60)
Glucose, Bld: 129 mg/dL — ABNORMAL HIGH (ref 70–99)
Potassium: 3 mmol/L — ABNORMAL LOW (ref 3.5–5.1)
Sodium: 140 mmol/L (ref 135–145)

## 2021-11-07 LAB — MAGNESIUM: Magnesium: 2.2 mg/dL (ref 1.7–2.4)

## 2021-11-08 ENCOUNTER — Inpatient Hospital Stay: Payer: Medicare HMO

## 2021-11-08 VITALS — BP 128/71 | HR 82 | Temp 98.9°F | Resp 18

## 2021-11-08 DIAGNOSIS — C7B8 Other secondary neuroendocrine tumors: Secondary | ICD-10-CM | POA: Diagnosis not present

## 2021-11-08 DIAGNOSIS — I700 Atherosclerosis of aorta: Secondary | ICD-10-CM | POA: Diagnosis not present

## 2021-11-08 DIAGNOSIS — R1013 Epigastric pain: Secondary | ICD-10-CM | POA: Diagnosis not present

## 2021-11-08 DIAGNOSIS — C7A8 Other malignant neuroendocrine tumors: Secondary | ICD-10-CM | POA: Diagnosis not present

## 2021-11-08 DIAGNOSIS — F17210 Nicotine dependence, cigarettes, uncomplicated: Secondary | ICD-10-CM | POA: Diagnosis not present

## 2021-11-08 DIAGNOSIS — I2510 Atherosclerotic heart disease of native coronary artery without angina pectoris: Secondary | ICD-10-CM | POA: Diagnosis not present

## 2021-11-08 DIAGNOSIS — K5730 Diverticulosis of large intestine without perforation or abscess without bleeding: Secondary | ICD-10-CM | POA: Diagnosis not present

## 2021-11-08 DIAGNOSIS — Z6831 Body mass index (BMI) 31.0-31.9, adult: Secondary | ICD-10-CM | POA: Diagnosis not present

## 2021-11-08 DIAGNOSIS — R197 Diarrhea, unspecified: Secondary | ICD-10-CM

## 2021-11-08 DIAGNOSIS — N400 Enlarged prostate without lower urinary tract symptoms: Secondary | ICD-10-CM | POA: Diagnosis not present

## 2021-11-08 MED ORDER — POTASSIUM CHLORIDE CRYS ER 10 MEQ PO TBCR
40.0000 meq | EXTENDED_RELEASE_TABLET | Freq: Once | ORAL | Status: AC
  Administered 2021-11-08: 40 meq via ORAL

## 2021-11-08 MED ORDER — SODIUM CHLORIDE 0.9 % IV SOLN
INTRAVENOUS | Status: DC
  Filled 2021-11-08 (×2): qty 250

## 2021-11-08 MED ORDER — POTASSIUM CHLORIDE 20 MEQ/100ML IV SOLN
20.0000 meq | Freq: Once | INTRAVENOUS | Status: AC
  Administered 2021-11-08: 20 meq via INTRAVENOUS

## 2021-11-09 ENCOUNTER — Telehealth: Payer: Self-pay | Admitting: Family Medicine

## 2021-11-09 NOTE — Telephone Encounter (Signed)
Recent C diff diarrhea after augmentin course.  He was started on vancomycin 10d course by Palliative care NP and should be taking this.  Plz call for update on symptoms, ensure diarrhea improving and he's not having any trouble on vancomycin.

## 2021-11-09 NOTE — Telephone Encounter (Signed)
Spoke with pt asking for update on sxs. States diarrhea is a lot better and sinus HA is gone.  Expresses his thanks for the call.

## 2021-11-10 ENCOUNTER — Encounter: Payer: Self-pay | Admitting: Anesthesiology

## 2021-11-10 ENCOUNTER — Ambulatory Visit: Payer: Medicare HMO | Admitting: Anesthesiology

## 2021-11-10 ENCOUNTER — Encounter
Admission: RE | Disposition: A | Discharge status: Home / Self Care | Payer: Self-pay | Source: Ambulatory Visit | Attending: Gastroenterology

## 2021-11-10 ENCOUNTER — Ambulatory Visit
Admission: RE | Admit: 2021-11-10 | Discharge: 2021-11-10 | Disposition: A | Discharge status: Home / Self Care | Payer: Medicare HMO | Source: Ambulatory Visit | Attending: Gastroenterology | Admitting: Gastroenterology

## 2021-11-10 ENCOUNTER — Other Ambulatory Visit: Payer: Medicare HMO

## 2021-11-10 ENCOUNTER — Other Ambulatory Visit: Payer: Self-pay

## 2021-11-10 DIAGNOSIS — R590 Localized enlarged lymph nodes: Secondary | ICD-10-CM | POA: Insufficient documentation

## 2021-11-10 DIAGNOSIS — G4730 Sleep apnea, unspecified: Secondary | ICD-10-CM | POA: Diagnosis not present

## 2021-11-10 DIAGNOSIS — D759 Disease of blood and blood-forming organs, unspecified: Secondary | ICD-10-CM | POA: Insufficient documentation

## 2021-11-10 DIAGNOSIS — J449 Chronic obstructive pulmonary disease, unspecified: Secondary | ICD-10-CM | POA: Diagnosis not present

## 2021-11-10 DIAGNOSIS — K838 Other specified diseases of biliary tract: Secondary | ICD-10-CM | POA: Insufficient documentation

## 2021-11-10 DIAGNOSIS — F17210 Nicotine dependence, cigarettes, uncomplicated: Secondary | ICD-10-CM | POA: Diagnosis not present

## 2021-11-10 DIAGNOSIS — K3189 Other diseases of stomach and duodenum: Secondary | ICD-10-CM | POA: Diagnosis not present

## 2021-11-10 DIAGNOSIS — D649 Anemia, unspecified: Secondary | ICD-10-CM | POA: Diagnosis not present

## 2021-11-10 DIAGNOSIS — K668 Other specified disorders of peritoneum: Secondary | ICD-10-CM | POA: Diagnosis not present

## 2021-11-10 DIAGNOSIS — E7800 Pure hypercholesterolemia, unspecified: Secondary | ICD-10-CM | POA: Insufficient documentation

## 2021-11-10 DIAGNOSIS — R935 Abnormal findings on diagnostic imaging of other abdominal regions, including retroperitoneum: Secondary | ICD-10-CM | POA: Diagnosis not present

## 2021-11-10 DIAGNOSIS — I10 Essential (primary) hypertension: Secondary | ICD-10-CM | POA: Insufficient documentation

## 2021-11-10 DIAGNOSIS — D131 Benign neoplasm of stomach: Secondary | ICD-10-CM | POA: Diagnosis not present

## 2021-11-10 HISTORY — PX: EUS: SHX5427

## 2021-11-10 SURGERY — UPPER ENDOSCOPIC ULTRASOUND (EUS) LINEAR
Anesthesia: General

## 2021-11-10 MED ORDER — SODIUM CHLORIDE 0.9 % IV SOLN: INTRAVENOUS | Status: DC

## 2021-11-10 MED ORDER — PHENYLEPHRINE HCL (PRESSORS) 10 MG/ML IV SOLN
INTRAVENOUS | Status: DC | PRN
  Administered 2021-11-10: 50 ug via INTRAVENOUS

## 2021-11-10 MED ORDER — PROPOFOL 10 MG/ML IV BOLUS
INTRAVENOUS | Status: AC
  Filled 2021-11-10: qty 20

## 2021-11-10 MED ORDER — LIDOCAINE HCL (PF) 2 % IJ SOLN
INTRAMUSCULAR | Status: AC
  Filled 2021-11-10: qty 5

## 2021-11-10 MED ORDER — FENTANYL CITRATE (PF) 100 MCG/2ML IJ SOLN
INTRAMUSCULAR | Status: DC | PRN
  Administered 2021-11-10: 50 ug via INTRAVENOUS

## 2021-11-10 MED ORDER — FENTANYL CITRATE (PF) 100 MCG/2ML IJ SOLN
INTRAMUSCULAR | Status: AC
  Filled 2021-11-10: qty 2

## 2021-11-10 MED ORDER — PROPOFOL 500 MG/50ML IV EMUL
INTRAVENOUS | Status: AC
  Filled 2021-11-10: qty 50

## 2021-11-10 MED ORDER — PROPOFOL 500 MG/50ML IV EMUL
INTRAVENOUS | Status: DC | PRN
  Administered 2021-11-10: 150 ug/kg/min via INTRAVENOUS

## 2021-11-10 MED ORDER — LIDOCAINE HCL (CARDIAC) PF 100 MG/5ML IV SOSY
PREFILLED_SYRINGE | INTRAVENOUS | Status: DC | PRN
  Administered 2021-11-10: 100 mg via INTRAVENOUS

## 2021-11-10 NOTE — Transfer of Care (Signed)
Immediate Anesthesia Transfer of Care Note  Patient: Dominic Turner  Procedure(s) Performed: UPPER ENDOSCOPIC ULTRASOUND (EUS) LINEAR  Patient Location: PACU  Anesthesia Type:General  Level of Consciousness: awake and oriented  Airway & Oxygen Therapy: Patient Spontanous Breathing and Patient connected to face mask oxygen  Post-op Assessment: Report given to RN and Post -op Vital signs reviewed and stable  Post vital signs: Reviewed and stable  Last Vitals:  Vitals Value Taken Time  BP 108/50 11/10/21 1500  Temp 36 C 11/10/21 1459  Pulse 88 11/10/21 1501  Resp 26 11/10/21 1501  SpO2 94 % 11/10/21 1501  Vitals shown include unvalidated device data.  Last Pain:  Vitals:   11/10/21 1459  TempSrc: Temporal  PainSc: Asleep         Complications: No notable events documented.

## 2021-11-10 NOTE — Anesthesia Procedure Notes (Signed)
Date/Time: 11/10/2021 2:15 PM Performed by: Vaughan Sine Pre-anesthesia Checklist: Patient identified, Emergency Drugs available, Suction available, Patient being monitored and Timeout performed Patient Re-evaluated:Patient Re-evaluated prior to induction Oxygen Delivery Method: Simple face mask Preoxygenation: Pre-oxygenation with 100% oxygen Induction Type: IV induction Airway Equipment and Method: Bite block Placement Confirmation: CO2 detector and positive ETCO2

## 2021-11-10 NOTE — Progress Notes (Signed)
Tumor Board Documentation  Dominic Turner was presented by Dr Cephas Darby at our Tumor Board on 11/10/2021, which included representatives from medical oncology, radiation oncology, internal medicine, navigation, pathology, radiology, surgical, genetics, palliative care, pulmonology, pharmacy, research.  Dominic Turner currently presents as a current patient, for discussion with history of the following treatments: active survellience, surgical intervention(s).  Additionally, we reviewed previous medical and familial history, history of present illness, and recent lab results along with all available histopathologic and imaging studies. The tumor board considered available treatment options and made the following recommendations: Biopsy (EUS)    The following procedures/referrals were also placed: No orders of the defined types were placed in this encounter.   Clinical Trial Status: not discussed   Staging used: To be determined  National site-specific guidelines   were discussed with respect to the case.  Tumor board is a meeting of clinicians from various specialty areas who evaluate and discuss patients for whom a multidisciplinary approach is being considered. Final determinations in the plan of care are those of the provider(s). The responsibility for follow up of recommendations given during tumor board is that of the provider.   Today's extended care, comprehensive team conference, Dominic Turner was not present for the discussion and was not examined.   Multidisciplinary Tumor Board is a multidisciplinary case peer review process.  Decisions discussed in the Multidisciplinary Tumor Board reflect the opinions of the specialists present at the conference without having examined the patient.  Ultimately, treatment and diagnostic decisions rest with the primary provider(s) and the patient.

## 2021-11-10 NOTE — Op Note (Signed)
[facility] Patient Name: Dominic Turner Procedure Date: 11/10/2021 1:57 PM MRN: [id number] Account #: [id number] Date of Birth: [birth year] Admit Type: Outpatient Age: 75 Room: [facility] ENDO ROOM 3 Gender: Male Note Status: Finalized Instrument Name: Radial EUS [id number],Linear EUS Scope [id number] Procedure:             Upper EUS Indications:           Duodenal deformity on endoscopy/Subepithelial tumor                         vs. extrinsic compression, Suspected mass on                         abdominal/pelvic CT scan Patient Profile:       Refer to note in patient chart for documentation of                         history and physical. Providers:             Lenetta Quaker. Cephas Darby, MD Referring MD:          Ria Bush (Referring MD), Charlaine Dalton                         (Referring MD) Medicines:             Monitored Anesthesia Care Complications:         No immediate complications. Procedure:             Pre-Anesthesia Assessment:                        - Monitored anesthesia care under the supervision of a                         CRNA was determined to be medically necessary for this                         procedure based on complex procedure (ERCP, EUS).                        After obtaining informed consent, the endoscope was                         passed under direct vision. Throughout the procedure,                         the patient's blood pressure, pulse, and oxygen                         saturations were monitored continuously. The Endoscope                         was introduced through the mouth, and advanced to the                         duodenum for ultrasound examination from the                         esophagus, stomach and duodenum. The Endoscope was  introduced through the mouth, and advanced to the                         duodenum for ultrasound examination from the                          esophagus, stomach and duodenum. Findings:      ENDOSCOPIC FINDING: :      The entire examined stomach was endoscopically normal.      A small submucosal lesion with no bleeding was found at the major       papilla. Biopsies were taken with a cold forceps for histology.      ENDOSONOGRAPHIC FINDING: :      An oval intramural (subepithelial) lesion was found in the ampulla. The       lesion was hypoechoic. Endosonographically, the lesion appeared to       originate from within the deep mucosa (Layer 2) and submucosa (Layer 3).       The lesion measured 5.9 mm by 12.7 mm. The outer margins were irregular.       Fine needle biopsy was performed. Color Doppler imaging was utilized       prior to needle puncture to confirm a lack of significant vascular       structures within the needle path. Four passes were made with the 25       gauge SharkCore biopsy needle using a transduodenal approach. A visible       core of tissue was obtained. Preliminary cytologic examination and touch       preps were performed. The cellularity of the specimen was adequate.       Final cytology results are pending.      There was no sign of significant endosonographic abnormality in the       common bile duct and in the common hepatic duct. The maximum diameter of       the CBD was 4.9 mm and CHD was 1.9 mm.      There was no sign of significant endosonographic abnormality in the       pancreatic head, pancreatic body, pancreatic tail and pancreatic neck.       The pancreatic duct measured 3.6 mm in the head, 1.7 mm in the neck and       1.6 mm in the body. It was not visualized within the tail.      One abnormal lymph node was visualized in the peripancreatic region. The       node was oval, hypoechoic and had well defined margins. It measured 4.2       mm by 4.3 mm.      Anechoic lesions suggestive of multiple cysts were identified in the       left lobe of the liver. There was no associated mass. The largest        visualized cyst measured 21.7 mm by 22.1 mm. Impression:            EGD Impression:                        - The esophagus was not visualized with the                         duodenoscope.                        -  The visualized portions of the stomach were normal.                        - Ampullary lesion. Biopsied.                        EUS Impression:                        - An intramural (subepithelial) lesion was found in                         the ampulla. The lesion appeared to originate from                         within the deep mucosa (Layer 2) and submucosa (Layer                         3). Tissue was obtained from this exam, and results                         are pending. 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 Fine needle                         biopsy performed.                        - There was no sign of significant pathology in the                         common bile duct and in the common hepatic duct.                        - There was no sign of significant pathology in the                         pancreatic head, pancreatic body, pancreatic tail and                         pancreatic neck.                        - One abnormal lymph node was visualized in the                         peripancreatic region. Given diminutive size it was                         not felt to be amenable to EUS biopsy.                        - Multiple cystic lesions were found in the left lobe                         of the liver. Correlate with cross sectional imaging  for further characterization. Recommendation:        - Discharge patient to home.                        - Await cytology results and await path results.                        - Return to referring physician as previously                         scheduled.                        - The findings and recommendations were  discussed with                         the patient. Procedure Code(s):     --- Professional ---                        [phone], Esophagogastroduodenoscopy, flexible,                         transoral; with transendoscopic ultrasound-guided                         intramural or transmural fine needle                         aspiration/biopsy(s), (includes endoscopic ultrasound                         examination limited to the esophagus, stomach or                         duodenum, and adjacent structures)                        43239, 59, Esophagogastroduodenoscopy, flexible,                         transoral; with biopsy, single or multiple Diagnosis Code(s):     --- Professional ---                        K31.89, Other diseases of stomach and duodenum                        I89.9, Noninfective disorder of lymphatic vessels and                         lymph nodes, unspecified                        K76.89, Other specified diseases of liver                        K83.8, Other specified diseases of biliary tract                        R93.5, Abnormal findings on diagnostic imaging of  other abdominal regions, including retroperitoneum CPT copyright 2019 [employer]. All rights reserved. The codes documented in this report are preliminary and upon coder review may  be revised to meet current compliance requirements. Attending Participation:      I personally performed the entire procedure. Dr. Lenetta Quaker. Cephas Darby, MD Lenetta Quaker. Teneisha Gignac, MD 11/10/2021 3:09:54 PM This report has been signed electronically. Number of Addenda: 0 Note Initiated On: 11/10/2021 1:57 PM Estimated Blood Loss:  Estimated blood loss was minimal.      [facility]

## 2021-11-10 NOTE — Anesthesia Preprocedure Evaluation (Signed)
Anesthesia Evaluation  Patient identified by MRN, date of birth, ID band Patient awake    Reviewed: Allergy & Precautions, NPO status , Patient's Chart, lab work & pertinent test results  History of Anesthesia Complications Negative for: history of anesthetic complications  Airway Mallampati: IV   Neck ROM: Full    Dental  (+) Partial Lower, Partial Upper   Pulmonary sleep apnea , COPD, Current Smoker (1/2 ppd)Patient did not abstain from smoking.,    Pulmonary exam normal breath sounds clear to auscultation       Cardiovascular hypertension, Normal cardiovascular exam Rhythm:Regular Rate:Normal  ECG 09/01/21: normal   Neuro/Psych PSYCHIATRIC DISORDERS Anxiety Depression negative neurological ROS     GI/Hepatic negative GI ROS,   Endo/Other  Obesity   Renal/GU negative Renal ROS     Musculoskeletal   Abdominal   Peds  Hematology  (+) Blood dyscrasia, anemia ,   Anesthesia Other Findings   Reproductive/Obstetrics                             Anesthesia Physical Anesthesia Plan  ASA: 3  Anesthesia Plan: General   Post-op Pain Management:    Induction: Intravenous  PONV Risk Score and Plan: 1 and Propofol infusion, TIVA and Treatment may vary due to age or medical condition  Airway Management Planned: Natural Airway  Additional Equipment:   Intra-op Plan:   Post-operative Plan:   Informed Consent: I have reviewed the patients History and Physical, chart, labs and discussed the procedure including the risks, benefits and alternatives for the proposed anesthesia with the patient or authorized representative who has indicated his/her understanding and acceptance.       Plan Discussed with: CRNA  Anesthesia Plan Comments: (LMA/GETA backup discussed.  Patient consented for risks of anesthesia including but not limited to:  - adverse reactions to medications - damage to eyes,  teeth, lips or other oral mucosa - nerve damage due to positioning  - sore throat or hoarseness - damage to heart, brain, nerves, lungs, other parts of body or loss of life  Informed patient about role of CRNA in peri- and intra-operative care.  Patient voiced understanding.)        Anesthesia Quick Evaluation

## 2021-11-10 NOTE — H&P (Signed)
PRE-PROCEDURE HISTORY AND PHYSICAL   Dominic Turner presents for his scheduled Procedure(s): UPPER ENDOSCOPIC ULTRASOUND (EUS) LINEAR.  The indication for the procedure(s) is mesenteric mass/node.  There have been no significant recent changes in the patient's medical status.  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 Post bronchodilator test not improved.   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 Cristina Turner)   COLONOSCOPY  05/2016   TA x3, diverticulosis, single angiodysplastic lesion, rpt 3 yrs (Buccini)   COLONOSCOPY  09/2019   TA, HP, rpt 5 yrs (Buccini)   COLONOSCOPY WITH PROPOFOL N/A 10/12/2021   multiple TA, rpt 3 yrs Dominic Turner, Dominic Mech, MD)   ESOPHAGOGASTRODUODENOSCOPY N/A 10/12/2021   chronic gastritis with + H pylori (treated with clarithromycin, amoxicillin, omeprazole x2wks), intestinal metaplasia present rpt 6 months Dominic Turner, Dominic Mech, MD)    Allergies Allergies  Allergen Reactions   Aspirin Other (See  Comments)    REACTION: gi upset; baby ASA ok   Sertraline Hcl Other (See Comments)    REACTION: sexual dysfunction, etc   Sulfonamide Derivatives Other (See Comments)    Reaction: unknown   Wellbutrin [Bupropion] Rash    Face rash and difficulty sleeping    Medications Docusate Calcium, Multiple Vitamin Essential, albuterol, amLODipine, amoxicillin, clarithromycin, clobetasol, fluticasone, loratadine, omeprazole, ondansetron, polyethylene glycol powder, potassium chloride SA, pravastatin, tamsulosin, tiotropium, traZODone, and vancomycin  Physical Examination  Body mass index is 31.6 kg/m. BP 135/86   Pulse 90   Temp (!) 96.6 F (35.9 C) (Temporal)   Resp 20   Ht 5\' 9"  (1.753 m)   Wt 97.1 kg   SpO2 100%   BMI 31.60 kg/m  General:   Alert,  pleasant and cooperative in NAD Head:  Normocephalic and atraumatic. Neck:  Supple; no masses or thyromegaly. Lungs:  Clear throughout to auscultation.    Heart:  Regular rate and rhythm. Abdomen:  Soft, nontender and nondistended. Normal bowel sounds, without guarding, and without rebound.   Neurologic:  Alert and  oriented x4;  grossly normal neurologically.  ASSESSMENT AND PLAN  [name] has been evaluated and deemed appropriate to undergo the planned Procedure(s): UPPER ENDOSCOPIC ULTRASOUND (EUS) LINEAR.

## 2021-11-11 ENCOUNTER — Encounter: Payer: Self-pay | Admitting: Gastroenterology

## 2021-11-11 NOTE — Anesthesia Postprocedure Evaluation (Signed)
Anesthesia Post Note  Patient: Dominic Turner  Procedure(s) Performed: UPPER ENDOSCOPIC ULTRASOUND (EUS) LINEAR  Patient location during evaluation: PACU Anesthesia Type: General Level of consciousness: awake and alert, oriented and patient cooperative Pain management: pain level controlled Vital Signs Assessment: post-procedure vital signs reviewed and stable Respiratory status: spontaneous breathing, nonlabored ventilation and respiratory function stable Cardiovascular status: blood pressure returned to baseline and stable Postop Assessment: adequate PO intake Anesthetic complications: no   No notable events documented.   Last Vitals:  Vitals:   11/10/21 1520 11/10/21 1530  BP: 139/73 140/76  Pulse:    Resp:    Temp:    SpO2:      Last Pain:  Vitals:   11/11/21 0741  TempSrc:   PainSc: 0-No pain                 Darrin Nipper

## 2021-11-14 LAB — SURGICAL PATHOLOGY

## 2021-11-14 LAB — CYTOLOGY - NON PAP

## 2021-11-17 ENCOUNTER — Inpatient Hospital Stay: Payer: Medicare HMO | Attending: Internal Medicine

## 2021-11-17 ENCOUNTER — Inpatient Hospital Stay: Payer: Medicare HMO

## 2021-11-17 ENCOUNTER — Inpatient Hospital Stay: Payer: Medicare HMO | Admitting: Internal Medicine

## 2021-11-17 ENCOUNTER — Encounter: Payer: Self-pay | Admitting: Internal Medicine

## 2021-11-17 ENCOUNTER — Other Ambulatory Visit: Payer: Self-pay

## 2021-11-17 VITALS — BP 133/68 | HR 91 | Temp 98.8°F | Resp 18 | Wt 208.4 lb

## 2021-11-17 DIAGNOSIS — Z886 Allergy status to analgesic agent status: Secondary | ICD-10-CM | POA: Diagnosis not present

## 2021-11-17 DIAGNOSIS — J449 Chronic obstructive pulmonary disease, unspecified: Secondary | ICD-10-CM | POA: Diagnosis not present

## 2021-11-17 DIAGNOSIS — Z8349 Family history of other endocrine, nutritional and metabolic diseases: Secondary | ICD-10-CM | POA: Diagnosis not present

## 2021-11-17 DIAGNOSIS — A0472 Enterocolitis due to Clostridium difficile, not specified as recurrent: Secondary | ICD-10-CM | POA: Diagnosis not present

## 2021-11-17 DIAGNOSIS — I700 Atherosclerosis of aorta: Secondary | ICD-10-CM | POA: Insufficient documentation

## 2021-11-17 DIAGNOSIS — Z8719 Personal history of other diseases of the digestive system: Secondary | ICD-10-CM | POA: Insufficient documentation

## 2021-11-17 DIAGNOSIS — I2510 Atherosclerotic heart disease of native coronary artery without angina pectoris: Secondary | ICD-10-CM | POA: Diagnosis not present

## 2021-11-17 DIAGNOSIS — Z8379 Family history of other diseases of the digestive system: Secondary | ICD-10-CM | POA: Insufficient documentation

## 2021-11-17 DIAGNOSIS — E876 Hypokalemia: Secondary | ICD-10-CM | POA: Insufficient documentation

## 2021-11-17 DIAGNOSIS — Z87891 Personal history of nicotine dependence: Secondary | ICD-10-CM | POA: Insufficient documentation

## 2021-11-17 DIAGNOSIS — D3A098 Benign carcinoid tumors of other sites: Secondary | ICD-10-CM

## 2021-11-17 DIAGNOSIS — Z86010 Personal history of colonic polyps: Secondary | ICD-10-CM | POA: Diagnosis not present

## 2021-11-17 DIAGNOSIS — A0471 Enterocolitis due to Clostridium difficile, recurrent: Secondary | ICD-10-CM | POA: Diagnosis not present

## 2021-11-17 DIAGNOSIS — Z823 Family history of stroke: Secondary | ICD-10-CM | POA: Diagnosis not present

## 2021-11-17 DIAGNOSIS — N400 Enlarged prostate without lower urinary tract symptoms: Secondary | ICD-10-CM | POA: Diagnosis not present

## 2021-11-17 DIAGNOSIS — F17210 Nicotine dependence, cigarettes, uncomplicated: Secondary | ICD-10-CM | POA: Insufficient documentation

## 2021-11-17 DIAGNOSIS — Z9049 Acquired absence of other specified parts of digestive tract: Secondary | ICD-10-CM | POA: Diagnosis not present

## 2021-11-17 DIAGNOSIS — Z8249 Family history of ischemic heart disease and other diseases of the circulatory system: Secondary | ICD-10-CM | POA: Insufficient documentation

## 2021-11-17 DIAGNOSIS — Z79899 Other long term (current) drug therapy: Secondary | ICD-10-CM | POA: Diagnosis not present

## 2021-11-17 DIAGNOSIS — Z833 Family history of diabetes mellitus: Secondary | ICD-10-CM | POA: Insufficient documentation

## 2021-11-17 DIAGNOSIS — E7800 Pure hypercholesterolemia, unspecified: Secondary | ICD-10-CM | POA: Insufficient documentation

## 2021-11-17 DIAGNOSIS — I10 Essential (primary) hypertension: Secondary | ICD-10-CM | POA: Diagnosis not present

## 2021-11-17 DIAGNOSIS — D3A019 Benign carcinoid tumor of the small intestine, unspecified portion: Secondary | ICD-10-CM | POA: Diagnosis not present

## 2021-11-17 DIAGNOSIS — K6389 Other specified diseases of intestine: Secondary | ICD-10-CM

## 2021-11-17 DIAGNOSIS — Z882 Allergy status to sulfonamides status: Secondary | ICD-10-CM | POA: Insufficient documentation

## 2021-11-17 LAB — CBC WITH DIFFERENTIAL/PLATELET
Abs Immature Granulocytes: 0.01 10*3/uL (ref 0.00–0.07)
Basophils Absolute: 0.1 10*3/uL (ref 0.0–0.1)
Basophils Relative: 1 %
Eosinophils Absolute: 0.2 10*3/uL (ref 0.0–0.5)
Eosinophils Relative: 5 %
HCT: 46.1 % (ref 39.0–52.0)
Hemoglobin: 14.9 g/dL (ref 13.0–17.0)
Immature Granulocytes: 0 %
Lymphocytes Relative: 28 %
Lymphs Abs: 1.4 10*3/uL (ref 0.7–4.0)
MCH: 24.4 pg — ABNORMAL LOW (ref 26.0–34.0)
MCHC: 32.3 g/dL (ref 30.0–36.0)
MCV: 75.5 fL — ABNORMAL LOW (ref 80.0–100.0)
Monocytes Absolute: 0.5 10*3/uL (ref 0.1–1.0)
Monocytes Relative: 9 %
Neutro Abs: 2.8 10*3/uL (ref 1.7–7.7)
Neutrophils Relative %: 57 %
Platelets: 232 10*3/uL (ref 150–400)
RBC: 6.11 MIL/uL — ABNORMAL HIGH (ref 4.22–5.81)
RDW: 18.1 % — ABNORMAL HIGH (ref 11.5–15.5)
WBC: 4.9 10*3/uL (ref 4.0–10.5)
nRBC: 0 % (ref 0.0–0.2)

## 2021-11-17 LAB — IRON AND TIBC
Iron: 66 ug/dL (ref 45–182)
Saturation Ratios: 27 % (ref 17.9–39.5)
TIBC: 241 ug/dL — ABNORMAL LOW (ref 250–450)
UIBC: 175 ug/dL

## 2021-11-17 LAB — COMPREHENSIVE METABOLIC PANEL
ALT: 15 U/L (ref 0–44)
AST: 21 U/L (ref 15–41)
Albumin: 3.7 g/dL (ref 3.5–5.0)
Alkaline Phosphatase: 81 U/L (ref 38–126)
Anion gap: 12 (ref 5–15)
BUN: 8 mg/dL (ref 8–23)
CO2: 27 mmol/L (ref 22–32)
Calcium: 9.9 mg/dL (ref 8.9–10.3)
Chloride: 101 mmol/L (ref 98–111)
Creatinine, Ser: 1.04 mg/dL (ref 0.61–1.24)
GFR, Estimated: >60 mL/min (ref >60)
Glucose, Bld: 123 mg/dL — ABNORMAL HIGH (ref 70–99)
Potassium: 3.5 mmol/L (ref 3.5–5.1)
Sodium: 140 mmol/L (ref 135–145)
Total Bilirubin: 0.8 mg/dL (ref 0.3–1.2)
Total Protein: 6.3 g/dL — ABNORMAL LOW (ref 6.5–8.1)

## 2021-11-17 LAB — FERRITIN: Ferritin: 245 ng/mL (ref 24–336)

## 2021-11-17 NOTE — Progress Notes (Signed)
Patient has been seen by oncologist at [facility] and he would like to discuss MD suggestions.    Patient has a 6 lb wt loss and does have a slight decrease in appetite.

## 2021-11-17 NOTE — Assessment & Plan Note (Addendum)
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 Adjacent mildly enlarged noncalcified right mesenteric node, new.  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  Small focus of increased uptake within the ventral aspect of the right hemiabdomen localizing to a loop of small bowel.  NOV 2022 -EGD -duodenal submucosal lesions [not biopsied].  Endoscopic ultrasound December 2022-negative for malignancy.  #Given the low volume disease/and recent C. difficile infection/chronic dyspepsia-I think is reasonable to hold off any Sandostatin at this time.  Recommend surveillance.  I will reach out to patient's [place], Dr.Khwaja to discuss regarding patient's plan of care.  #Chronic dyspepsia-H. pylori infection s/p antibiotics; improved.  Complicated by C. Difficile/s/p treatment resolved.  # Smoking: Again recommended quitting smoking.  # DISPOSITION: HOLD off sandostatin # follow up in 3 omnths- MD; labs- cbc/cmp;Chromogranin-A- Dr.B

## 2021-11-17 NOTE — Progress Notes (Signed)
[facility] OFFICE PROGRESS NOTE  Patient Care Team: Ria Bush, MD as PCP - General [name], MD as Consulting Physician (Gastroenterology)   Cancer Staging  No matching staging information was found for the patient.   Oncology History   No history exists.    MPRESSION: 1. 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 Adjacent mildly enlarged noncalcified right mesenteric node, new. 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 DOTATATE PET-CT could be considered for further characterization. 2. No evidence of bowel obstruction or acute bowel inflammation. Mild scattered colonic diverticulosis, with no evidence of acute diverticulitis. 3. Mild prostatomegaly. 4. Coronary atherosclerosis. 5. Aortic Atherosclerosis (ICD10-I70   Gallium PET OCT 5th, 2022-Partially calcified mass within the small bowel mesentery is tracer avid compatible with neuroendocrine receptor avid tumor.  Small focus of increased uptake within the ventral aspect of the right hemiabdomen localizing to a loop of small bowel.  DIAGNOSIS:  A. AMPULLARY MASS; COLD BIOPSY:  - DUODENAL MUCOSA WITH BRUNNER GLAND PROLIFERATIVE LESION AND OVERLYING  REACTIVE EPITHELIAL CHANGES.  - SEE COMMENT.  - HISTOLOGIC FEATURES OF NEUROENDOCRINE TUMOR ARE NOT IDENTIFIED.  - NEGATIVE FOR DYSPLASIA AND MALIGNANCY.   Comment:  The clinical concern for a neuroendocrine tumor is noted.  Brunner gland proliferative lesions include Brunner gland hyperplasia,  hamartoma, and adenoma. Since there is histologic overlap between these  entities and local excision is curative, the suggested nomenclature is  to report these as ``Brunner gland proliferative lesions.  Dysplasia  and malignancy are not identified in the submitted material.     HISTORY OF PRESENT ILLNESS: Patient alone.  Ambulating independently.  Normal Recinos 75 y.o.  male pleasant patient above  history of history of chronic GI/abdominal discomfort clinically suggestive of low-grade carcinoid/small bowel is here for follow-up/review results of the endoscopic ultrasound biopsy.  In the interim [facility].  Patient underwent endoscopic ultrasound biopsy-noc postsurgical complications noted.  Patient was treated with antibiotics for H. pylori infection.  Had severe diarrhea.  Positive for C. difficile.  S/p vancomycin resolved.  Patient is back to his baseline health.    Review of Systems  Constitutional:  Negative for chills, diaphoresis, fever, malaise/fatigue and weight loss.  HENT:  Negative for nosebleeds and sore throat.   Eyes:  Negative for double vision.  Respiratory:  Negative for cough, hemoptysis, sputum production, shortness of breath and wheezing.   Cardiovascular:  Negative for chest pain, palpitations, orthopnea and leg swelling.  Gastrointestinal:  Negative for blood in stool, diarrhea, melena and vomiting.  Genitourinary:  Negative for dysuria, frequency and urgency.  Musculoskeletal:  Negative for back pain and joint pain.  Skin: Negative.  Negative for itching and rash.  Neurological:  Negative for dizziness, tingling, focal weakness, weakness and headaches.  Endo/Heme/Allergies:  Does not bruise/bleed easily.  Psychiatric/Behavioral:  Negative for depression. The patient is not nervous/anxious and does not have insomnia.      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 Post bronchodilator test not improved.   Diverticulosis 02/2013   by colonoscopy   Essential hypertension, benign  History of adenomatous polyp of colon 02/2013   rec rpt 3 yrs   Overweight(278.02)    Positive PPD, treated 2014   s/p rifampin x15mo   Pure hypercholesterolemia    Tobacco use disorder    Unspecified gastritis and gastroduodenitis without mention of hemorrhage    Unspecified sleep apnea     PAST SURGICAL HISTORY :   Past Surgical History:  Procedure Laterality Date   CATARACT EXTRACTION, BILATERAL Bilateral    CHOLECYSTECTOMY N/A 12/30/2015   Procedure: LAPAROSCOPIC CHOLECYSTECTOMY;  Surgeon: Florene Glen, MD;  Location: [facility] ORS;  Service: General;  Laterality: N/A;   COLONOSCOPY  03/03/2013   tubular adenoma x3, diverticulosis, rpt 3 yrs (Dr. Cristina Gong)   COLONOSCOPY  05/2016   TA x3, diverticulosis, single angiodysplastic lesion, rpt 3 yrs (Buccini)   COLONOSCOPY  09/2019   TA, HP, rpt 5 yrs (Buccini)   COLONOSCOPY WITH PROPOFOL N/A 10/12/2021   multiple TA, rpt 3 yrs Vicente Males, Bailey Mech, MD)   ESOPHAGOGASTRODUODENOSCOPY N/A 10/12/2021   chronic gastritis with + H pylori (treated with clarithromycin, amoxicillin, omeprazole x2wks), intestinal metaplasia present rpt 6 months [name], MD)   EUS N/A 11/10/2021   Procedure: UPPER ENDOSCOPIC ULTRASOUND (EUS) LINEAR;  Surgeon: Reita Cliche, MD;  Location: [facility] END[facility];  Service: Gastroenterology;  Laterality: N/A;  [facility]    FAMILY HISTORY :   Family History  Problem Relation Age of Onset   Cirrhosis Father        died in 68's   Hypertension Mother    Heart attack Sister    Stroke Sister    Heart attack Maternal Grandmother    Stroke Maternal Grandmother    Thyroid disease Sister    Diabetes Sister    Cancer Neg Hx     SOCIAL HISTORY:   Social History   Tobacco Use   Smoking status: Every Day    Packs/day: 0.75    Years: 56.00    Pack years: 42.00    Types: Cigarettes   Smokeless tobacco: Never  Vaping Use   Vaping Use: Never used  Substance Use Topics   Alcohol use: No    Alcohol/week: 0.0 standard  drinks   Drug use: No    ALLERGIES:  is allergic to aspirin, sertraline hcl, sulfonamide derivatives, and wellbutrin [bupropion].  MEDICATIONS:  Current Outpatient Medications  Medication Sig Dispense Refill   albuterol (PROVENTIL HFA;VENTOLIN HFA) 108 (90 Base) MCG/ACT inhaler Take 2 puffs in AM & PM every day for next 5 days, & also can use every 4-6 hours as needed. After 5 days, use only every 4-6 hrs as needed 1 Inhaler 0   amLODipine (NORVASC) 10 MG tablet Take 1 tablet (10 mg total) by mouth daily. 90 tablet 3   clobetasol (TEMOVATE) 0.05 % external solution Apply topically.     Docusate Calcium (STOOL SOFTENER PO) Take 1 tablet by mouth daily.     fluticasone (CUTIVATE) 0.05 % cream Apply topically. Apply to affected areas on face up to twice a day as needed     fluticasone (FLONASE) 50 MCG/ACT nasal spray USE 2 SPRAYS IN BOTH  NOSTRILS DAILY 48 g 3   loratadine (CLARITIN) 10 MG tablet Take 1 tablet (10 mg total) by mouth daily.     Multiple Vitamin Essential TABS Take 1 tablet by mouth daily.     ondansetron (ZOFRAN-ODT) 4 MG disintegrating tablet Take 1 tablet (4 mg total) by mouth every 8 (eight) hours as needed for nausea or vomiting.  20 tablet 0   pravastatin (PRAVACHOL) 40 MG tablet Take 1 tablet (40 mg total) by mouth daily. 90 tablet 3   tamsulosin (FLOMAX) 0.4 MG CAPS capsule Take 1 capsule (0.4 mg total) by mouth daily. 90 capsule 3   tiotropium (SPIRIVA HANDIHALER) 18 MCG inhalation capsule INHALE THE CONTENTS OF 1  CAPSULE BY MOUTH VIA  HANDIHALER DAILY AT BEDTIME 90 capsule 3   traZODone (DESYREL) 50 MG tablet TAKE ONE AND ONE-HALF TABLETS BY MOUTH AT BEDTIME AS NEEDED     omeprazole (PRILOSEC) 20 MG capsule Take 1 capsule (20 mg total) by mouth 2 (two) times daily before a meal for 14 days. (Patient not taking: Reported on 11/17/2021) 28 capsule 0   polyethylene glycol powder (GLYCOLAX/MIRALAX) 17 GM/SCOOP powder Take 17 g by mouth daily. (Patient not taking: Reported on  11/17/2021) 3350 g 1   potassium chloride SA (KLOR-CON) 20 MEQ tablet Take 2 tablets (40 mEq total) by mouth daily. (Patient not taking: Reported on 11/17/2021) 20 tablet 0   vancomycin (VANCOCIN) 125 MG capsule Take 1 capsule (125 mg total) by mouth 4 (four) times daily. (Patient not taking: Reported on 11/17/2021) 40 capsule 0   Current Facility-Administered Medications  Medication Dose Route Frequency Provider Last Rate Last Admin   albuterol (PROVENTIL) (2.5 MG/3ML) 0.083% nebulizer solution 2.5 mg  2.5 mg Nebulization Once Guse, Jacquelynn Cree, FNP       betamethasone acetate-betamethasone sodium phosphate (CELESTONE) injection 3 mg  3 mg Intramuscular Once Edrick Kins, DPM        PHYSICAL EXAMINATION: ECOG PERFORMANCE STATUS: 0 - Asymptomatic  BP 133/68   Pulse 91   Temp 98.8 F (37.1 C)   Resp 18   Wt 208 lb 6.4 oz (94.5 kg)   BMI 30.78 kg/m   Filed Weights   11/17/21 0903  Weight: 208 lb 6.4 oz (94.5 kg)    Physical Exam Vitals and nursing note reviewed.  HENT:     Head: Normocephalic and atraumatic.     Mouth/Throat:     Pharynx: Oropharynx is clear.  Eyes:     Extraocular Movements: Extraocular movements intact.     Pupils: Pupils are equal, round, and reactive to light.  Cardiovascular:     Rate and Rhythm: Normal rate and regular rhythm.  Pulmonary:     Comments: Decreased breath sounds bilaterally.  Abdominal:     Palpations: Abdomen is soft.  Musculoskeletal:        General: Normal range of motion.     Cervical back: Normal range of motion.  Skin:    General: Skin is warm.  Neurological:     General: No focal deficit present.     Mental Status: He is alert and oriented to person, place, and time.  Psychiatric:        Behavior: Behavior normal.        Judgment: Judgment normal.       LABORATORY DATA:  I have reviewed the data as listed    Component Value Date/Time   NA 140 11/17/2021 0845   K 3.5 11/17/2021 0845   CL 101 11/17/2021 0845   CO2 27  11/17/2021 0845   GLUCOSE 123 (H) 11/17/2021 0845   BUN 8 11/17/2021 0845   CREATININE 1.04 11/17/2021 0845   CALCIUM 9.9 11/17/2021 0845   PROT 6.3 (L) 11/17/2021 0845   ALBUMIN 3.7 11/17/2021 0845   AST 21 11/17/2021 0845   ALT 15 11/17/2021 0845   ALKPHOS 81 11/17/2021  0845   BILITOT 0.8 11/17/2021 0845   GFRNONAA >60 11/17/2021 0845   GFRAA >60 03/30/2020 0958    No results found for: SPEP, UPEP  Lab Results  Component Value Date   WBC 4.9 11/17/2021   NEUTROABS 2.8 11/17/2021   HGB 14.9 11/17/2021   HCT 46.1 11/17/2021   MCV 75.5 (L) 11/17/2021   PLT 232 11/17/2021      Chemistry      Component Value Date/Time   NA 140 11/17/2021 0845   K 3.5 11/17/2021 0845   CL 101 11/17/2021 0845   CO2 27 11/17/2021 0845   BUN 8 11/17/2021 0845   CREATININE 1.04 11/17/2021 0845      Component Value Date/Time   CALCIUM 9.9 11/17/2021 0845   ALKPHOS 81 11/17/2021 0845   AST 21 11/17/2021 0845   ALT 15 11/17/2021 0845   BILITOT 0.8 11/17/2021 0845       RADIOGRAPHIC STUDIES: I have personally reviewed the radiological images as listed and agreed with the findings in the report. No results found.   ASSESSMENT & PLAN:  Carcinoid tumor of intestine #Mesenteric mass- heterogeneously coarsely calcified 2.7 x 1.5 cm right mesenteric mass, slightly increased. Adjacent mildly enlarged noncalcified right mesenteric node, new.  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  Small focus of increased uptake within the ventral aspect of the right hemiabdomen localizing to a loop of small bowel.  NOV 2022 -EGD -duodenal submucosal lesions [not biopsied].  Endoscopic ultrasound December 2022-negative for malignancy.  #Given the low volume disease/and recent C. difficile infection/chronic dyspepsia-I think is reasonable to hold off any Sandostatin at this  time.  Recommend surveillance.  I will reach out to patient's [place], Dr.Khwaja to discuss regarding patient's plan of care.  #Chronic dyspepsia-H. pylori infection s/p antibiotics; improved.  Complicated by C. Difficile/s/p treatment resolved.  # Smoking: Again recommended quitting smoking.  # DISPOSITION: HOLD off sandostatin # follow up in 3 omnths- MD; labs- cbc/cmp;Chromogranin-A- Dr.B       Orders Placed This Encounter  Procedures   CBC with Differential/Platelet    Standing Status:   Future    Standing Expiration Date:   11/17/2022   Comprehensive metabolic panel    Standing Status:   Future    Standing Expiration Date:   11/17/2022   Chromogranin A    Standing Status:   Future    Standing Expiration Date:   11/17/2022     All questions were answered. The patient knows to call the clinic with any problems, questions or concerns.      Cammie Sickle, MD 11/17/2021 9:41 AM

## 2021-11-19 ENCOUNTER — Encounter: Payer: Self-pay | Admitting: Family Medicine

## 2021-11-21 DIAGNOSIS — E7800 Pure hypercholesterolemia, unspecified: Secondary | ICD-10-CM | POA: Diagnosis not present

## 2021-11-21 DIAGNOSIS — Z0100 Encounter for examination of eyes and vision without abnormal findings: Secondary | ICD-10-CM | POA: Diagnosis not present

## 2021-11-21 DIAGNOSIS — H5200 Hypermetropia, unspecified eye: Secondary | ICD-10-CM | POA: Diagnosis not present

## 2021-11-30 ENCOUNTER — Ambulatory Visit: Payer: Medicare HMO | Admitting: Gastroenterology

## 2021-12-07 ENCOUNTER — Other Ambulatory Visit: Payer: Self-pay

## 2021-12-07 ENCOUNTER — Telehealth: Payer: Self-pay | Admitting: *Deleted

## 2021-12-07 DIAGNOSIS — R197 Diarrhea, unspecified: Secondary | ICD-10-CM

## 2021-12-07 NOTE — Telephone Encounter (Signed)
Contacted patient with recommendation. He will come by later today and pick up cup for stool specimen. Scheduled for [facility] tomorrow at 2pm. Pt verbalized understanding of plan.

## 2021-12-07 NOTE — Telephone Encounter (Signed)
Patient called reporting that he has had stomach problems for a few days where he has to go to the bathroom after he eats, and he had severe cramping and gas with it, this has now graduated into diarrhea as of yesterday he had 3 loose stools. He denies any fever, n/v, or other symptoms. 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

## 2021-12-07 NOTE — Telephone Encounter (Signed)
Probably needs to be evaluated. Do you know if he finished his antibitoics? Did he improve after antibitoics? I don't see any recent treatments. He may need some abdominal imaging to.   Faythe Casa, NP 12/07/2021 9:41 AM

## 2021-12-08 ENCOUNTER — Other Ambulatory Visit: Payer: Self-pay

## 2021-12-08 ENCOUNTER — Inpatient Hospital Stay: Payer: Medicare HMO

## 2021-12-08 ENCOUNTER — Other Ambulatory Visit: Payer: Self-pay | Admitting: Oncology

## 2021-12-08 ENCOUNTER — Telehealth: Payer: Self-pay

## 2021-12-08 ENCOUNTER — Encounter: Payer: Self-pay | Admitting: Nurse Practitioner

## 2021-12-08 ENCOUNTER — Inpatient Hospital Stay (HOSPITAL_BASED_OUTPATIENT_CLINIC_OR_DEPARTMENT_OTHER): Payer: Medicare HMO | Admitting: Nurse Practitioner

## 2021-12-08 ENCOUNTER — Other Ambulatory Visit: Payer: Self-pay | Admitting: Nurse Practitioner

## 2021-12-08 VITALS — BP 132/77 | HR 82 | Temp 98.5°F | Resp 16 | Wt 202.4 lb

## 2021-12-08 DIAGNOSIS — A0472 Enterocolitis due to Clostridium difficile, not specified as recurrent: Secondary | ICD-10-CM | POA: Diagnosis not present

## 2021-12-08 DIAGNOSIS — E7800 Pure hypercholesterolemia, unspecified: Secondary | ICD-10-CM | POA: Diagnosis not present

## 2021-12-08 DIAGNOSIS — R197 Diarrhea, unspecified: Secondary | ICD-10-CM

## 2021-12-08 DIAGNOSIS — I700 Atherosclerosis of aorta: Secondary | ICD-10-CM | POA: Diagnosis not present

## 2021-12-08 DIAGNOSIS — N400 Enlarged prostate without lower urinary tract symptoms: Secondary | ICD-10-CM | POA: Diagnosis not present

## 2021-12-08 DIAGNOSIS — D3A019 Benign carcinoid tumor of the small intestine, unspecified portion: Secondary | ICD-10-CM | POA: Diagnosis not present

## 2021-12-08 DIAGNOSIS — E876 Hypokalemia: Secondary | ICD-10-CM | POA: Diagnosis not present

## 2021-12-08 DIAGNOSIS — I10 Essential (primary) hypertension: Secondary | ICD-10-CM | POA: Diagnosis not present

## 2021-12-08 DIAGNOSIS — A0471 Enterocolitis due to Clostridium difficile, recurrent: Secondary | ICD-10-CM

## 2021-12-08 DIAGNOSIS — I2510 Atherosclerotic heart disease of native coronary artery without angina pectoris: Secondary | ICD-10-CM | POA: Diagnosis not present

## 2021-12-08 LAB — GASTROINTESTINAL PANEL BY PCR, STOOL (REPLACES STOOL CULTURE)

## 2021-12-08 LAB — CBC WITH DIFFERENTIAL/PLATELET
Abs Immature Granulocytes: 0.01 10*3/uL (ref 0.00–0.07)
Basophils Absolute: 0.1 10*3/uL (ref 0.0–0.1)
Basophils Relative: 1 %
Eosinophils Absolute: 0.2 10*3/uL (ref 0.0–0.5)
Eosinophils Relative: 4 %
HCT: 42.7 % (ref 39.0–52.0)
Hemoglobin: 14.2 g/dL (ref 13.0–17.0)
Immature Granulocytes: 0 %
Lymphocytes Relative: 29 %
Lymphs Abs: 1.9 10*3/uL (ref 0.7–4.0)
MCH: 24.4 pg — ABNORMAL LOW (ref 26.0–34.0)
MCHC: 33.3 g/dL (ref 30.0–36.0)
MCV: 73.4 fL — ABNORMAL LOW (ref 80.0–100.0)
Monocytes Absolute: 0.7 10*3/uL (ref 0.1–1.0)
Monocytes Relative: 10 %
Neutro Abs: 3.7 10*3/uL (ref 1.7–7.7)
Neutrophils Relative %: 56 %
Platelets: 204 10*3/uL (ref 150–400)
RBC: 5.82 MIL/uL — ABNORMAL HIGH (ref 4.22–5.81)
RDW: 17.3 % — ABNORMAL HIGH (ref 11.5–15.5)
WBC: 6.5 10*3/uL (ref 4.0–10.5)
nRBC: 0 % (ref 0.0–0.2)

## 2021-12-08 LAB — COMPREHENSIVE METABOLIC PANEL
ALT: 12 U/L (ref 0–44)
AST: 16 U/L (ref 15–41)
Albumin: 3.6 g/dL (ref 3.5–5.0)
Alkaline Phosphatase: 74 U/L (ref 38–126)
Anion gap: 10 (ref 5–15)
BUN: 8 mg/dL (ref 8–23)
CO2: 28 mmol/L (ref 22–32)
Calcium: 9.5 mg/dL (ref 8.9–10.3)
Chloride: 101 mmol/L (ref 98–111)
Creatinine, Ser: 1 mg/dL (ref 0.61–1.24)
GFR, Estimated: >60 mL/min (ref >60)
Glucose, Bld: 101 mg/dL — ABNORMAL HIGH (ref 70–99)
Potassium: 3 mmol/L — ABNORMAL LOW (ref 3.5–5.1)
Sodium: 139 mmol/L (ref 135–145)
Total Bilirubin: 0.9 mg/dL (ref 0.3–1.2)
Total Protein: 6.2 g/dL — ABNORMAL LOW (ref 6.5–8.1)

## 2021-12-08 LAB — C DIFFICILE QUICK SCREEN W PCR REFLEX
C Diff antigen: POSITIVE — AB
C Diff interpretation: DETECTED
C Diff toxin: POSITIVE — AB

## 2021-12-08 MED ORDER — FIDAXOMICIN 200 MG PO TABS: 200.0000 mg | ORAL_TABLET | Freq: Two times a day (BID) | ORAL | 0 refills | Status: AC

## 2021-12-08 MED ORDER — POTASSIUM CHLORIDE CRYS ER 20 MEQ PO TBCR: 20.0000 meq | EXTENDED_RELEASE_TABLET | Freq: Every day | ORAL | 0 refills | Status: AC

## 2021-12-08 NOTE — Progress Notes (Signed)
Symptom Management [place] [facility] [place]. [facility] [address] [place], [place] [address] [phone]) [phone] (fax)  Patient Care Team: Ria Bush, MD as PCP - General [name], MD as Consulting Physician (Gastroenterology)   Name of the patient: Dominic Turner  [id number]  11-May-1946   Date of visit: 12/08/21  Diagnosis-low-grade carcinoid of small bowel  Chief complaint/ Reason for visit-diarrhea  Heme/Onc history:  Mesenteric mass- heterogeneously coarsely calcified 2.7 x 1.5 cm right mesenteric mass, slightly increased. Adjacent mildly enlarged noncalcified right mesenteric node, new.  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  Small focus of increased uptake within the ventral aspect of the right hemiabdomen localizing to a loop of small bowel.  NOV 2022 -EGD -duodenal submucosal lesions [not biopsied].  Endoscopic ultrasound December 2022-negative for malignancy. Holding Sandostatin given low volume disease.   Patient also managed at [facility] by Dr. Quentin Ore.   Oncology History   No history exists.    Interval history-patient is 75 year old male who presents to symptom management clinic for complaints of diarrhea.  He was noted to have C. difficile infection in November 2022.  Was treated with oral vancomycin.  By his report, symptoms briefly improved and returned.  He was seen at the [facility] and was treated with Dificid.  Again, symptoms improved then returned. Today he complains of diarrhea multiple times a day with low abdominal cramping.   Review of systems- Review of Systems  Constitutional:  Negative for chills, fever, malaise/fatigue and weight loss.  HENT:  Negative for congestion, ear discharge, ear pain, sinus pain, sore  throat and tinnitus.   Eyes: Negative.   Respiratory: Negative.  Negative for cough, sputum production and shortness of breath.   Cardiovascular:  Negative for chest pain, palpitations, orthopnea, claudication and leg swelling.  Gastrointestinal:  Positive for abdominal pain and diarrhea. Negative for blood in stool, constipation, heartburn, nausea and vomiting.  Genitourinary: Negative.   Musculoskeletal: Negative.   Skin: Negative.   Neurological:  Negative for dizziness, tingling, weakness and headaches.  Endo/Heme/Allergies: Negative.   Psychiatric/Behavioral: Negative.      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 Post bronchodilator test not improved.   Diverticulosis 02/2013   by colonoscopy   Essential hypertension, benign    History of adenomatous polyp of colon 02/2013   rec rpt 3 yrs   Overweight(278.02)    Positive PPD, treated 2014   s/p rifampin x12mo   Pure hypercholesterolemia    Tobacco use disorder    Unspecified gastritis and gastroduodenitis without mention of hemorrhage    Unspecified sleep apnea     Past Surgical  History:  Procedure Laterality Date   CATARACT EXTRACTION, BILATERAL Bilateral    CHOLECYSTECTOMY N/A 12/30/2015   Procedure: LAPAROSCOPIC CHOLECYSTECTOMY;  Surgeon: Florene Glen, MD;  Location: [facility] ORS;  Service: General;   Laterality: N/A;   COLONOSCOPY  03/03/2013   tubular adenoma x3, diverticulosis, rpt 3 yrs (Dr. Cristina Gong)   COLONOSCOPY  05/2016   TA x3, diverticulosis, single angiodysplastic lesion, rpt 3 yrs (Buccini)   COLONOSCOPY  09/2019   TA, HP, rpt 5 yrs (Buccini)   COLONOSCOPY WITH PROPOFOL N/A 10/12/2021   multiple TA, rpt 3 yrs Vicente Males, Bailey Mech, MD)   ESOPHAGOGASTRODUODENOSCOPY N/A 10/12/2021   chronic gastritis with + H pylori (treated with clarithromycin, amoxicillin, omeprazole x2wks), intestinal metaplasia present rpt 6 months [name], MD)   EUS N/A 11/10/2021   UPPER ENDOSCOPIC ULTRASOUND (EUS) LINEAR - nondiagnostic;  ([place], Lenetta Quaker, MD)    Social History   Socioeconomic History   Marital status: Married    Spouse name: Not on file   Number of children: 2   Years of education: Not on file   Highest education level: Not on file  Occupational History   Occupation: truck driver  Tobacco Use   Smoking status: Every Day    Packs/day: 0.75    Years: 56.00    Pack years: 42.00    Types: Cigarettes   Smokeless tobacco: Never  Vaping Use   Vaping Use: Never used  Substance and Sexual Activity   Alcohol use: No    Alcohol/week: 0.0 standard drinks   Drug use: No   Sexual activity: Never  Other Topics Concern   Not on file  Social History Narrative   Lives alone. Wife lives at [facility] in [place] in [place] (memory unit). Sees wife daily.    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    Social Determinants of Health   Financial Resource Strain: Low Risk    Difficulty of Paying Living Expenses: Not hard at all  Food Insecurity: No  Food Insecurity   Worried About Charity fundraiser in the Last Year: Never true   [place] in the Last Year: Never true  Transportation Needs: No Transportation Needs   Lack of Transportation (Medical): No   Lack of Transportation (Non-Medical): No  Physical Activity: Inactive   Days of Exercise per Week: 0 days   Minutes of Exercise per Session: 0 min  Stress: No Stress Concern Present   Feeling of Stress : Not at all  Social Connections: Not on file  Intimate Partner Violence: Not At Risk   Fear of Current or Ex-Partner: No   Emotionally Abused: No   Physically Abused: No   Sexually Abused: No    Family History  Problem Relation Age of Onset   Cirrhosis Father        died in 35's   Hypertension Mother    Heart attack Sister    Stroke Sister    Heart attack Maternal Grandmother    Stroke Maternal Grandmother    Thyroid disease Sister    Diabetes Sister    Cancer Neg Hx  Current Outpatient Medications:    albuterol (PROVENTIL HFA;VENTOLIN HFA) 108 (90 Base) MCG/ACT inhaler, Take 2 puffs in AM & PM every day for next 5 days, & also can use every 4-6 hours as needed. After 5 days, use only every 4-6 hrs as needed, Disp: 1 Inhaler, Rfl: 0   amLODipine (NORVASC) 10 MG tablet, Take 1 tablet (10 mg total) by mouth daily., Disp: 90 tablet, Rfl: 3   clobetasol (TEMOVATE) 0.05 % external solution, Apply topically., Disp: , Rfl:    fluticasone (CUTIVATE) 0.05 % cream, Apply topically. Apply to affected areas on face up to twice a day as needed, Disp: , Rfl:    fluticasone (FLONASE) 50 MCG/ACT nasal spray, USE 2 SPRAYS IN BOTH  NOSTRILS DAILY, Disp: 48 g, Rfl: 3   loratadine (CLARITIN) 10 MG tablet, Take 1 tablet (10 mg total) by mouth daily., Disp: , Rfl:    Multiple Vitamin Essential TABS, Take 1 tablet by mouth daily., Disp: , Rfl:    pravastatin (PRAVACHOL) 40 MG tablet, Take 1 tablet (40 mg total) by mouth daily., Disp: 90 tablet, Rfl: 3   tamsulosin (FLOMAX) 0.4 MG  CAPS capsule, Take 1 capsule (0.4 mg total) by mouth daily., Disp: 90 capsule, Rfl: 3   tiotropium (SPIRIVA HANDIHALER) 18 MCG inhalation capsule, INHALE THE CONTENTS OF 1  CAPSULE BY MOUTH VIA  HANDIHALER DAILY AT BEDTIME, Disp: 90 capsule, Rfl: 3   traZODone (DESYREL) 50 MG tablet, TAKE ONE AND ONE-HALF TABLETS BY MOUTH AT BEDTIME AS NEEDED, Disp: , Rfl:    Docusate Calcium (STOOL SOFTENER PO), Take 1 tablet by mouth daily. (Patient not taking: Reported on 12/08/2021), Disp: , Rfl:    fidaxomicin (DIFICID) 200 MG TABS tablet, Take 1 tablet (200 mg total) by mouth 2 (two) times daily for 10 days., Disp: 20 tablet, Rfl: 0   omeprazole (PRILOSEC) 20 MG capsule, Take 1 capsule (20 mg total) by mouth 2 (two) times daily before a meal for 14 days. (Patient not taking: Reported on 11/17/2021), Disp: 28 capsule, Rfl: 0   ondansetron (ZOFRAN-ODT) 4 MG disintegrating tablet, Take 1 tablet (4 mg total) by mouth every 8 (eight) hours as needed for nausea or vomiting. (Patient not taking: Reported on 12/08/2021), Disp: 20 tablet, Rfl: 0   polyethylene glycol powder (GLYCOLAX/MIRALAX) 17 GM/SCOOP powder, Take 17 g by mouth daily. (Patient not taking: Reported on 11/17/2021), Disp: 3350 g, Rfl: 1   potassium chloride SA (KLOR-CON) 20 MEQ tablet, Take 2 tablets (40 mEq total) by mouth daily. (Patient not taking: Reported on 11/17/2021), Disp: 20 tablet, Rfl: 0  Current Facility-Administered Medications:    albuterol (PROVENTIL) (2.5 MG/3ML) 0.083% nebulizer solution 2.5 mg, 2.5 mg, Nebulization, Once, Guse, Tanyika Barros M, FNP   betamethasone acetate-betamethasone sodium phosphate (CELESTONE) injection 3 mg, 3 mg, Intramuscular, Once, Edrick Kins, DPM  Physical exam:  Vitals:   12/08/21 1422  BP: 132/77  Pulse: 82  Resp: 16  Temp: 98.5 F (36.9 C)  TempSrc: Tympanic  SpO2: 98%  Weight: 202 lb 6.4 oz (91.8 kg)   Physical Exam Vitals reviewed.  Constitutional:      Appearance: He is not ill-appearing.   Pulmonary:     Effort: No respiratory distress.  Skin:    Coloration: Skin is not pale.  Neurological:     Mental Status: He is alert and oriented to person, place, and time.  Psychiatric:        Mood and Affect: Mood normal.  Behavior: Behavior normal.     CMP Latest Ref Rng & Units 12/08/2021  Glucose 70 - 99 mg/dL 101(H)  BUN 8 - 23 mg/dL 8  Creatinine 0.61 - 1.24 mg/dL 1.00  Sodium 135 - 145 mmol/L 139  Potassium 3.5 - 5.1 mmol/L 3.0(L)  Chloride 98 - 111 mmol/L 101  CO2 22 - 32 mmol/L 28  Calcium 8.9 - 10.3 mg/dL 9.5  Total Protein 6.5 - 8.1 g/dL 6.2(L)  Total Bilirubin 0.3 - 1.2 mg/dL 0.9  Alkaline Phos 38 - 126 U/L 74  AST 15 - 41 U/L 16  ALT 0 - 44 U/L 12   CBC Latest Ref Rng & Units 12/08/2021  WBC 4.0 - 10.5 K/uL 6.5  Hemoglobin 13.0 - 17.0 g/dL 14.2  Hematocrit 39.0 - 52.0 % 42.7  Platelets 150 - 400 K/uL 204   No images are attached to the encounter.  No results found.  Assessment and plan- Patient is a 75 y.o. 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   He's previously had c diff at least 2 times in past 2 months and was treated with vancomycin and dificid previously. Symptoms recurred and he again tests positive today. He has been referred to GI. I reached out to Dr. Vicente Males who is able to see him sooner than previously scheduled.   Avoid antidiarrheal agents in interim. Reviewed enteric precautions. He's very worried about infecting his wife who resides at memory care center.   Hypokalemia- d/t gi losses. Will start oral potassium 20 meq once daily.   In regards to his carcinoid, he can follow upw ith Dr. Rogue Bussing as scheduled. Return to clinic in the interim as needed.   Visit Diagnosis 1. Recurrent Clostridium difficile diarrhea    Patient expressed understanding and was in agreement with this plan. He also understands that He can  call clinic at any time with any questions, concerns, or complaints.   Thank you for allowing me to participate in the care of this very pleasant patient.   Beckey Rutter, DNP, AGNP-C [place] [facility] [place]

## 2021-12-08 NOTE — Telephone Encounter (Signed)
Patient has been informed regarding appointment to see Dr. Vicente Males on 01/03. Pt verbalized understanding.

## 2021-12-08 NOTE — Progress Notes (Signed)
Called patient. Number not in service. Called son. Patient is in clinic for appointment for follow up for c diff positivity. Will see him as scheduled.

## 2021-12-08 NOTE — Progress Notes (Signed)
Pt in for symptom management for diarrhea.  Pt brought stool sample in earlier to cancer center lab this morning.  Reports diarrhea on and off for 2 weeks but in last 2-3 days having 3-4 stools daily and states looks like yellow slime. Pt down 6 lbs since 11/17/21.

## 2021-12-13 ENCOUNTER — Other Ambulatory Visit: Payer: Self-pay

## 2021-12-13 ENCOUNTER — Encounter: Payer: Self-pay | Admitting: Gastroenterology

## 2021-12-13 ENCOUNTER — Ambulatory Visit (INDEPENDENT_AMBULATORY_CARE_PROVIDER_SITE_OTHER): Payer: Medicare HMO | Admitting: Gastroenterology

## 2021-12-13 VITALS — BP 130/79 | HR 81 | Temp 98.3°F | Wt 201.0 lb

## 2021-12-13 DIAGNOSIS — F4323 Adjustment disorder with mixed anxiety and depressed mood: Secondary | ICD-10-CM | POA: Insufficient documentation

## 2021-12-13 DIAGNOSIS — H9313 Tinnitus, bilateral: Secondary | ICD-10-CM | POA: Insufficient documentation

## 2021-12-13 DIAGNOSIS — L810 Postinflammatory hyperpigmentation: Secondary | ICD-10-CM | POA: Insufficient documentation

## 2021-12-13 DIAGNOSIS — K2100 Gastro-esophageal reflux disease with esophagitis, without bleeding: Secondary | ICD-10-CM | POA: Insufficient documentation

## 2021-12-13 DIAGNOSIS — A0471 Enterocolitis due to Clostridium difficile, recurrent: Secondary | ICD-10-CM | POA: Diagnosis not present

## 2021-12-13 DIAGNOSIS — C7A8 Other malignant neuroendocrine tumors: Secondary | ICD-10-CM | POA: Insufficient documentation

## 2021-12-13 DIAGNOSIS — H6120 Impacted cerumen, unspecified ear: Secondary | ICD-10-CM | POA: Insufficient documentation

## 2021-12-13 DIAGNOSIS — Z659 Problem related to unspecified psychosocial circumstances: Secondary | ICD-10-CM | POA: Insufficient documentation

## 2021-12-13 DIAGNOSIS — H903 Sensorineural hearing loss, bilateral: Secondary | ICD-10-CM | POA: Insufficient documentation

## 2021-12-13 DIAGNOSIS — Z461 Encounter for fitting and adjustment of hearing aid: Secondary | ICD-10-CM | POA: Insufficient documentation

## 2021-12-13 NOTE — Patient Instructions (Signed)
Please stop taking soft drinks.  Please give [place] a call before your appointment so we know if you are still having diarrhea or not.

## 2021-12-13 NOTE — Progress Notes (Signed)
[name] MD, MRCP(U.K) [address]  [place]  [place], [place] [address]  Main: [phone]  Fax: [phone]   Primary Care Physician: Ria Bush, MD  Primary Gastroenterologist:  Dr. [name]   C/c : Recurrent C diff diarrhea   HPI: Dominic Turner is a 76 y.o. male  Summary of history :  Initially referred and seen on 09/28/2021 with a mesenteric mass concerning for carcinoid.  This is within the small bowel mesentery, in addition he has a lot of bloating not improved on Gas-X or probiotics and been referred for further evaluation with endoscopy.  09/01/2021 CT abdomen pelvis with contrast shows mesenteric mass.  Colonic diverticulosis. 09/14/2021 PET scan shows calcified mass within the small bowel mesentery compatible neuroendocrine tumor.  Small uptake in the ventral aspect of the right hemiabdomen localizing to a loop of small bowel which may represent a small bowel lesion.  09/01/2021 CMP normal.  Hemoglobin 15.9 g with an MCV of 73.  History of thalassemia. 05/30/2016 colonoscopy.  Small polyps resected.  Tubular adenoma x3   He states that he has not a lot of gas and bloating usually in the left side of his abdomen after his dinner which wakes him up from sleep.  He smokes half a pack of cigarettes a day.  Consumes artificial sugars and sweeteners in his diet.  No probiotics at this point of time.  He has a bowel movement every other day only when he takes an OTC agent.  Denies it being very hot.  The bloating is better after a good bowel movement.  No NSAID use.  Interval history  09/28/2021-12/13/2020  10/10/2021: MR enterogram: Enhancing mass within the ileocolic mesentery corresponds with the neuroendocrine receptor avid tumor identified on recent PET-CT. No small bowel abnormality seen .   10/12/2021: EGD+colonoscopy EGD showed a large submucosal mass in the third portion of the duodenum.  Subsequently referred for EUS.  Gastritis on biopsy.  On the  same day underwent a colonoscopy which showed 3 sessile polyps in the ascending colon that was resected biopsies of stomach showed H. pylori gastritis with intestinal metaplasia.  And the polyps in the colon were tubular adenomas.  Recommended triple therapy for H. pylori eradication and EGD in 6 months for gastric mapping.  Colonoscopy in 3 years 11/10/2021: EUS: FNA did not show any evidence of neoplasm.  Was diagnosed with C. difficile diarrhea status posttreatment.  Initially positive on 11/01/2021 Repeat test was positive on 12/08/2021.  Initially treated with oral vancomycin and symptoms resolved but recurred and then treated with Dificid  Presently he states that he does not have diarrhea in fact he had a hard formed stool today.  He states that when he drinks certain drinks like Dr. Malachi Bonds he has diarrhea.  Denies any abdominal pain.   Current Outpatient Medications  Medication Sig Dispense Refill   amLODipine (NORVASC) 10 MG tablet Take 1 tablet (10 mg total) by mouth daily. 90 tablet 3   clobetasol (TEMOVATE) 0.05 % external solution Apply topically.     Docusate Calcium (STOOL SOFTENER PO) Take 1 tablet by mouth daily.     fidaxomicin (DIFICID) 200 MG TABS tablet Take 1 tablet (200 mg total) by mouth 2 (two) times daily for 10 days. 20 tablet 0   fluticasone (CUTIVATE) 0.05 % cream Apply topically. Apply to affected areas on face up to twice a day as needed     fluticasone (FLONASE) 50 MCG/ACT nasal spray USE 2 SPRAYS  IN BOTH  NOSTRILS DAILY 48 g 3   loratadine (CLARITIN) 10 MG tablet Take 1 tablet (10 mg total) by mouth daily.     Multiple Vitamin Essential TABS Take 1 tablet by mouth daily.     omeprazole (PRILOSEC) 20 MG capsule Take 1 capsule (20 mg total) by mouth 2 (two) times daily before a meal for 14 days. 28 capsule 0   potassium chloride SA (KLOR-CON M) 20 MEQ tablet Take 1 tablet (20 mEq total) by mouth daily. 30 tablet 0   pravastatin (PRAVACHOL) 40 MG tablet Take 1 tablet  (40 mg total) by mouth daily. 90 tablet 3   tamsulosin (FLOMAX) 0.4 MG CAPS capsule Take 1 capsule (0.4 mg total) by mouth daily. 90 capsule 3   tiotropium (SPIRIVA HANDIHALER) 18 MCG inhalation capsule INHALE THE CONTENTS OF 1  CAPSULE BY MOUTH VIA  HANDIHALER DAILY AT BEDTIME 90 capsule 3   traZODone (DESYREL) 50 MG tablet TAKE ONE AND ONE-HALF TABLETS BY MOUTH AT BEDTIME AS NEEDED     albuterol (PROVENTIL HFA;VENTOLIN HFA) 108 (90 Base) MCG/ACT inhaler Take 2 puffs in AM & PM every day for next 5 days, & also can use every 4-6 hours as needed. After 5 days, use only every 4-6 hrs as needed (Patient not taking: Reported on 12/13/2021) 1 Inhaler 0   ondansetron (ZOFRAN-ODT) 4 MG disintegrating tablet Take 1 tablet (4 mg total) by mouth every 8 (eight) hours as needed for nausea or vomiting. (Patient not taking: Reported on 12/13/2021) 20 tablet 0   polyethylene glycol powder (GLYCOLAX/MIRALAX) 17 GM/SCOOP powder Take 17 g by mouth daily. (Patient not taking: Reported on 11/17/2021) 3350 g 1   Current Facility-Administered Medications  Medication Dose Route Frequency Provider Last Rate Last Admin   albuterol (PROVENTIL) (2.5 MG/3ML) 0.083% nebulizer solution 2.5 mg  2.5 mg Nebulization Once Guse, Jacquelynn Cree, FNP       betamethasone acetate-betamethasone sodium phosphate (CELESTONE) injection 3 mg  3 mg Intramuscular Once Edrick Kins, DPM        Allergies as of 12/13/2021 - Review Complete 12/13/2021  Allergen Reaction Noted   Aspirin Other (See Comments)    Sertraline hcl Other (See Comments) 11/27/2008   Sulfonamide derivatives Other (See Comments)    Wellbutrin [bupropion] Rash 04/09/2014    ROS:  General: Negative for anorexia, weight loss, fever, chills, fatigue, weakness. ENT: Negative for hoarseness, difficulty swallowing , nasal congestion. CV: Negative for chest pain, angina, palpitations, dyspnea on exertion, peripheral edema.  Respiratory: Negative for dyspnea at rest, dyspnea on  exertion, cough, sputum, wheezing.  GI: See history of present illness. GU:  Negative for dysuria, hematuria, urinary incontinence, urinary frequency, nocturnal urination.  Endo: Negative for unusual weight change.    Physical Examination:   BP 130/79    Pulse 81    Temp 98.3 F (36.8 C) (Oral)    Wt 201 lb (91.2 kg)    BMI 29.68 kg/m   General: Well-nourished, well-developed in no acute distress.  Eyes: No icterus. Conjunctivae pink. Mouth: Oropharyngeal mucosa moist and pink , no lesions erythema or exudate. Lungs: Clear to auscultation bilaterally. Non-labored. Heart: Regular rate and rhythm, no murmurs rubs or gallops.  Abdomen: Bowel sounds are normal, nontender, nondistended, no hepatosplenomegaly or masses, no abdominal bruits or hernia , no rebound or guarding.   Extremities: No lower extremity edema. No clubbing or deformities. Neuro: Alert and oriented x 3.  Grossly intact. Skin: Warm and dry, no jaundice.  Psych: Alert and cooperative, normal mood and affect.   Imaging Studies: No results found.  Assessment and Plan:   Dominic Turner is a 76 y.o. y/o male  recently been diagnosed with mesenteric mass and being evaluated for carcinoid.  He had 2 episodes of C. difficile diarrhea which was initially treated with vancomycin and subsequently treated with Dificid.  Has been referred to me for recurrent C. difficile diarrhea.  Presently he does not have diarrhea and he had a formed bowel movement today.  I explained to him that if the stool is formed would not require any further treatment at this point of time.  Obviously if the diarrhea returns he needs to come back and see me.  I have explained to him that he should probably take pictures of his bowel movements on his phone if he is not sure.  In addition he clearly states that he gets the diarrhea only when he consumes certain foods such as Dr. Malachi Bonds which may be causing an osmotic diarrhea.    Plan 1.  Stop all  artificial sugars and sweeteners and probiotics which could cause an osmotic diarrhea.    2.  If the C. difficile diarrhea recurs then we will place him on a slow vancomycin taper and in addition commence him on  bezlotoxumab10 mg/kg as a single dose during antibacterial treatment.  3.  I will closely follow him.  I will see him back in the office in 2 weeks time to ensure that he does not have any issues with recurrence     Dr [name]  MD,MRCP [facility]) Follow up in 2 weeks

## 2021-12-15 ENCOUNTER — Telehealth: Payer: Self-pay | Admitting: *Deleted

## 2021-12-15 DIAGNOSIS — D3A098 Benign carcinoid tumors of other sites: Secondary | ICD-10-CM

## 2021-12-15 NOTE — Telephone Encounter (Signed)
Patient called asking if it is normal for him to be losing weight. He has lost 36 pounds in the past 8 months unintentionally. He asks if his cancer is the cause or what. Please advise.

## 2021-12-16 NOTE — Telephone Encounter (Signed)
Patient informed and agrees to see dietician, referral entered.

## 2021-12-22 ENCOUNTER — Inpatient Hospital Stay: Payer: Medicare HMO | Attending: Internal Medicine

## 2021-12-22 ENCOUNTER — Other Ambulatory Visit: Payer: Self-pay

## 2021-12-22 NOTE — Progress Notes (Signed)
Nutrition Assessment   Reason for Assessment:  MD referral regarding weight loss   ASSESSMENT:  76 year old male with mesenteric mass NET, nonfunctional.  Currently followed by Dr Rogue Bussing and not on treatment.  Past medical history of COPD, HTN, GERD.    Met with patient in clinic due to his concerns for weight loss.  Per Dr Rogue Bussing cancer is not causing current weight loss.  Reports that his appetite has decreased and not eating as much.  Says that he eats steak and egg biscuit around 6:30-7am, skips lunch and eats dinner in the late evening. Dinner maybe [place] [employer] mini [place] sandwich and couple of cookies.  Has not tried oral nutrition supplements.  Says that he is not having any watery stools.  Some days may have 3 soft formed stools, some days 1 or 2.  Says that his bloating and churning in stomach is less.  Patient was positive for c-diff colits on 11/22 and repeat test positive on 12/08/21 and completed oral vancomycin.  Patient reports prior to c-diff colitis issues with constipation. Denies nausea.  Does not cook at home and gets meals out at [employer].  Wife is in a nursing home.  Says that quality of food has declined at local restaurants since COVID.  Says greasy foods upset his stomach.   Medications: MVI, prilosec   Labs: K 3.0, glucose 101   Anthropometrics:   Height: 69 inches Weight: 197 lb 6 oz today (no jacket) 221 lb 10/7 BMI: 29 11% weight loss in the last 3 months, significant  Estimated Energy Needs  Kcals: 2250-2700 Protein: 112-135 g Fluid: 2.2 L   NUTRITION DIAGNOSIS: Inadequate oral intake related to altered GI function (cdiff colitis) as evidenced by 11% weight loss in the last 3 months and reduced oral intake   INTERVENTION:  Likely cause of weight loss coming from recent bout of c-diff colitis.   Patient agreeable to trying to add noon time meal.  Encouraged good sources of protein Discussed snacks that he can keep at home and  meals he can prepare at home (peanut butter and banana sandwiches) Discussed oral nutrition supplements but these can cause stomach upset in some patients.  Patient wanting to hold off at this time.  Contact information provided   MONITORING, EVALUATION, GOAL: weight trends, intake   Next Visit: Friday, March 10 th after MD visit  Aniyiah Zell B. Zenia Resides, [place], [place] Registered Dietitian [phone] (mobile)

## 2021-12-27 ENCOUNTER — Ambulatory Visit: Payer: Medicare HMO | Admitting: Gastroenterology

## 2022-01-02 ENCOUNTER — Ambulatory Visit: Payer: Medicare HMO | Admitting: Gastroenterology

## 2022-01-09 ENCOUNTER — Other Ambulatory Visit: Payer: Self-pay | Admitting: Nurse Practitioner

## 2022-02-13 ENCOUNTER — Telehealth: Payer: Self-pay | Admitting: *Deleted

## 2022-02-13 NOTE — Telephone Encounter (Signed)
Patient needs to reschedule appointment. ?

## 2022-02-15 ENCOUNTER — Telehealth: Payer: Self-pay | Admitting: *Deleted

## 2022-02-15 NOTE — Progress Notes (Unsigned)
Subjective:   Dominic Turner is a 76 y.o. male who presents for Medicare Annual/Subsequent preventive examination.  I connected with Valinda Hoar today by telephone and verified that I am speaking with the correct person using two identifiers. Location patient: home Location provider: work Persons participating in the virtual visit: patient, Marine scientist.    I discussed the limitations, risks, security and privacy concerns of performing an evaluation and management service by telephone and the availability of in person appointments. I also discussed with the patient that there may be a patient responsible charge related to this service. The patient expressed understanding and verbally consented to this telephonic visit.    Interactive audio and video telecommunications were attempted between this provider and patient, however failed, due to patient having technical difficulties OR patient did not have access to video capability.  We continued and completed visit with audio only.  Some vital signs may be absent or patient reported.   Time Spent with patient on telephone encounter: *** minutes  Review of Systems           Objective:    There were no vitals filed for this visit. There is no height or weight on file to calculate BMI.  Advanced Directives 12/08/2021 11/10/2021 10/12/2021 09/16/2021 09/09/2021 09/01/2021 02/15/2021  Does Patient Have a Medical Advance Directive? Yes Yes No;Yes Yes Yes Yes Yes  Type of Paramedic of [place];Living will [place];Living will [place];Living will [place];Living will [place];Living will [place];Living will [place];Living will  Does patient want to make changes to medical advance directive? - - - No - Patient declined Yes (ED - Information included in AVS) - -  Copy of Healthcare Power of Attorney in  Chart? - No - copy requested No - copy requested No - copy requested - - No - copy requested    Current Medications (verified) Outpatient Encounter Medications as of 02/16/2022  Medication Sig   albuterol (PROVENTIL HFA;VENTOLIN HFA) 108 (90 Base) MCG/ACT inhaler Take 2 puffs in AM & PM every day for next 5 days, & also can use every 4-6 hours as needed. After 5 days, use only every 4-6 hrs as needed (Patient not taking: Reported on 12/13/2021)   amLODipine (NORVASC) 10 MG tablet Take 1 tablet (10 mg total) by mouth daily.   clobetasol (TEMOVATE) 0.05 % external solution Apply topically.   Docusate Calcium (STOOL SOFTENER PO) Take 1 tablet by mouth daily.   fluticasone (CUTIVATE) 0.05 % cream Apply topically. Apply to affected areas on face up to twice a day as needed   fluticasone (FLONASE) 50 MCG/ACT nasal spray USE 2 SPRAYS IN BOTH  NOSTRILS DAILY   loratadine (CLARITIN) 10 MG tablet Take 1 tablet (10 mg total) by mouth daily.   Multiple Vitamin Essential TABS Take 1 tablet by mouth daily.   omeprazole (PRILOSEC) 20 MG capsule Take 1 capsule (20 mg total) by mouth 2 (two) times daily before a meal for 14 days.   ondansetron (ZOFRAN-ODT) 4 MG disintegrating tablet Take 1 tablet (4 mg total) by mouth every 8 (eight) hours as needed for nausea or vomiting. (Patient not taking: Reported on 12/13/2021)   polyethylene glycol powder (GLYCOLAX/MIRALAX) 17 GM/SCOOP powder Take 17 g by mouth daily. (Patient not taking: Reported on 11/17/2021)   potassium chloride SA (KLOR-CON M) 20 MEQ tablet Take 1 tablet (20 mEq total) by mouth daily.   pravastatin (  PRAVACHOL) 40 MG tablet Take 1 tablet (40 mg total) by mouth daily.   tamsulosin (FLOMAX) 0.4 MG CAPS capsule Take 1 capsule (0.4 mg total) by mouth daily.   tiotropium (SPIRIVA HANDIHALER) 18 MCG inhalation capsule INHALE THE CONTENTS OF 1  CAPSULE BY MOUTH VIA  HANDIHALER DAILY AT BEDTIME   traZODone (DESYREL) 50 MG tablet TAKE ONE AND ONE-HALF TABLETS BY  MOUTH AT BEDTIME AS NEEDED   Facility-Administered Encounter Medications as of 02/16/2022  Medication   albuterol (PROVENTIL) (2.5 MG/3ML) 0.083% nebulizer solution 2.5 mg   betamethasone acetate-betamethasone sodium phosphate (CELESTONE) injection 3 mg    Allergies (verified) Aspirin, Sertraline hcl, Sulfonamide derivatives, and Wellbutrin [bupropion]   History: Past Medical History:  Diagnosis Date   Acute cholecystitis 12/2015   ANXIETY DEPRESSION 10/23/2008   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 Post bronchodilator test not improved.   Diverticulosis 02/2013   by colonoscopy   Essential hypertension, benign    History of adenomatous polyp of colon 02/2013   rec rpt 3 yrs   Overweight(278.02)    Positive PPD, treated 2014   s/p rifampin x52mo  Pure hypercholesterolemia    Tobacco use disorder    Unspecified gastritis and gastroduodenitis without mention of hemorrhage    Unspecified sleep apnea    Past Surgical History:  Procedure Laterality Date   CATARACT EXTRACTION, BILATERAL Bilateral    CHOLECYSTECTOMY N/A 12/30/2015   Procedure: LAPAROSCOPIC CHOLECYSTECTOMY;  Surgeon: RFlorene Glen MD;  Location: [facility] ORS;  Service: General;  Laterality: N/A;   COLONOSCOPY  03/03/2013   tubular adenoma x3, diverticulosis, rpt 3 yrs (Dr. BCristina Gong   COLONOSCOPY  05/2016   TA x3, diverticulosis, single angiodysplastic lesion, rpt 3 yrs (Buccini)   COLONOSCOPY  09/2019   TA, HP, rpt 5 yrs (Buccini)   COLONOSCOPY WITH PROPOFOL N/A 10/12/2021   multiple TA, rpt 3 yrs (Vicente Males KBailey Mech MD)   ESOPHAGOGASTRODUODENOSCOPY N/A 10/12/2021   chronic gastritis with + H pylori (treated with clarithromycin, amoxicillin, omeprazole x2wks), intestinal metaplasia present rpt 6 months ([name] MD)   EUS N/A 11/10/2021   UPPER ENDOSCOPIC ULTRASOUND (EUS) LINEAR - nondiagnostic;  ([place], JLenetta Quaker MD)   Family History  Problem Relation Age of Onset   Cirrhosis Father        died in 652's  Hypertension Mother    Heart attack Sister    Stroke Sister    Heart attack Maternal Grandmother    Stroke Maternal Grandmother    Thyroid disease Sister    Diabetes Sister    Cancer Neg Hx    Social History   Socioeconomic History   Marital status: Married    Spouse name: Not on file   Number of children: 2   Years of education: Not on file   Highest education level: Not on file  Occupational History   Occupation: truck driver  Tobacco Use   Smoking status: Every Day    Packs/day: 0.75    Years: 56.00    Pack years: 42.00    Types: Cigarettes   Smokeless tobacco: Never  Vaping Use   Vaping Use: Never used  Substance and Sexual Activity   Alcohol use: No    Alcohol/week: 0.0 standard drinks   Drug use:  No   Sexual activity: Never  Other Topics Concern   Not on file  Social History Narrative   Lives alone. Wife lives at [place] in [place] in [place] (memory unit). Sees wife daily.    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    Social Determinants of Health   Financial Resource Strain: Low Risk    Difficulty of Paying Living Expenses: Not hard at all  Food Insecurity: No Food Insecurity   Worried About Charity fundraiser in the Last Year: Never true   [place] in the Last Year: Never true  Transportation Needs: No Transportation Needs   Lack of Transportation (Medical): No   Lack of Transportation  (Non-Medical): No  Physical Activity: Inactive   Days of Exercise per Week: 0 days   Minutes of Exercise per Session: 0 min  Stress: No Stress Concern Present   Feeling of Stress : Not at all  Social Connections: Not on file    Tobacco Counseling Ready to quit: Not Answered Counseling given: Not Answered   Clinical Intake:                 Diabetic? No         Activities of Daily Living In your present state of health, do you have any difficulty performing the following activities: 02/15/2021  Hearing? Y  Comment wears hearing aids  Vision? N  Difficulty concentrating or making decisions? Y  Comment sometimes he forgets things  Walking or climbing stairs? N  Dressing or bathing? N  Doing errands, shopping? N  Preparing Food and eating ? N  Using the Toilet? N  In the past six months, have you accidently leaked urine? Y  Comment does not wear protection  Do you have problems with loss of bowel control? N  Managing your Medications? N  Managing your Finances? N  Housekeeping or managing your Housekeeping? N  Some recent data might be hidden    Patient Care Team: Ria Bush, MD as PCP - General [name], MD as Consulting Physician (Gastroenterology) Cammie Sickle, MD as Consulting Physician (Hematology and Oncology)  Indicate any recent Medical Services you may have received from other than [facility] providers in the past year (date may be approximate).     Assessment:   This is a routine wellness examination for Dominic Turner.  Hearing/Vision screen No results found.  Dietary issues and exercise activities discussed:     Goals Addressed   None    Depression Screen PHQ 2/9 Scores 02/15/2021 02/11/2020 02/04/2019 01/24/2018 01/19/2017 01/18/2016 01/15/2015  PHQ - 2 Score 0 0 0 0 0 0 0  PHQ- 9 Score 0 0 0 0 - - -    Fall Risk Fall Risk  02/15/2021 02/11/2020 02/04/2019 01/24/2018 01/19/2017  Falls in the past year? 0 0 0 No No  Number falls in past yr: 0 0  - - -  Injury with Fall? 0 0 - - -  Risk for fall due to : Medication side effect Medication side effect - - -  Follow up Falls evaluation completed;Falls prevention discussed Falls evaluation completed;Falls  prevention discussed - - -    FALL RISK PREVENTION PERTAINING TO THE HOME:  Any stairs in or around the home? {YES/NO:21197} If so, are there any without handrails? {YES/NO:21197} Home free of loose throw rugs in walkways, pet beds, electrical cords, etc? {YES/NO:21197} Adequate lighting in your home to reduce risk of falls? {YES/NO:21197}  ASSISTIVE DEVICES UTILIZED TO PREVENT FALLS:  Life alert? {YES/NO:21197} Use of a cane, walker or w/c? {YES/NO:21197} Grab bars in the bathroom? {YES/NO:21197} Shower chair or bench in shower? {YES/NO:21197} Elevated toilet seat or a handicapped toilet? {YES/NO:21197}  TIMED UP AND GO:  Was the test performed? No .  Length of time to ambulate 10 feet: *** sec.   {Appearance of Gait:2101803}  Cognitive Function: MMSE - Mini Mental State Exam 02/15/2021 02/11/2020 02/04/2019 01/24/2018  Not completed: Refused - - -  Orientation to time - '5 5 5  '$ Orientation to Place - '5 5 5  '$ Registration - '3 3 3  '$ Attention/ Calculation - 5 0 0  Recall - '3 3 2  '$ Recall-comments - - - unable to recall 1 of 3 words  Language- name 2 objects - - 0 0  Language- repeat - '1 1 1  '$ Language- follow 3 step command - - 3 1  Language- follow 3 step command-comments - - - unable to follow 2 steps of 3 step command  Language- read & follow direction - - 0 0  Write a sentence - - 0 0  Copy design - - 0 0  Total score - - 20 17        Immunizations Immunization History  Administered Date(s) Administered   Fluad Quad(high Dose 65+) 08/27/2019, 09/23/2020   Influenza Split 11/15/2012   Influenza Whole 12/06/1999, 10/11/2013   Influenza, High Dose Seasonal PF 09/12/2015, 08/11/2018, 10/03/2021   Influenza,inj,Quad PF,6+ Mos 10/02/2014, 09/14/2017    Influenza-Unspecified 09/18/2016   Moderna SARS-COV2 Booster Vaccination 10/01/2020   Moderna Sars-Covid-2 Vaccination 02/06/2020, 02/14/2020   PFIZER Comirnaty(Gray Top)Covid-19 Tri-Sucrose Vaccine 05/25/2021   PPD Test 02/07/2013   Pfizer Covid-19 Vaccine Bivalent Booster 52yr & up 10/03/2021   Pneumococcal Conjugate-13 01/15/2015   Pneumococcal Polysaccharide-23 12/05/2002, 06/26/2008, 01/09/2014   Td 03/11/1998, 06/26/2008   Tdap 11/22/2021   Zoster Recombinat (Shingrix) 11/22/2021   Zoster, Live 10/11/2013    TDAP status: Up to date  Flu Vaccine status: Up to date  Pneumococcal vaccine status: Up to date  Covid-19 vaccine status: Completed vaccines  Qualifies for Shingles Vaccine? Yes   Zostavax completed Yes   {Shingrix Completed?:2101804}  Screening Tests Health Maintenance  Topic Date Due   Zoster Vaccines- Shingrix (2 of 2) 01/17/2022   COLONOSCOPY (Pts 45-456yrInsurance coverage will need to be confirmed)  10/12/2024   TETANUS/TDAP  11/23/2031   Pneumonia Vaccine 6550Years old  Completed   INFLUENZA VACCINE  Completed   COVID-19 Vaccine  Completed   Hepatitis C Screening  Completed   HPV VACCINES  Aged Out    Health Maintenance  Health Maintenance Due  Topic Date Due   Zoster Vaccines- Shingrix (2 of 2) 01/17/2022    Colorectal cancer screening: Type of screening: Colonoscopy. Completed 10/12/21. Repeat every 3 years  Lung Cancer Screening: (Low Dose CT Chest recommended if Age 75-80ears, 30 pack-year currently smoking OR have quit w/in 15years.) does qualify, CT lung completed 03/22/21    Additional Screening:  Hepatitis C Screening: does qualify; Completed 01/14/16  Vision Screening: Recommended annual ophthalmology exams for early detection of glaucoma and other  disorders of the eye. Is the patient up to date with their annual eye exam?  {YES/NO:21197} Who is the provider or what is the name of the office in which the patient attends annual eye  exams? *** If pt is not established with a provider, would they like to be referred to a provider to establish care? {YES/NO:21197}.   Dental Screening: Recommended annual dental exams for proper oral hygiene  Community Resource Referral / Chronic Care Management: CRR required this visit?  {YES/NO:21197}  CCM required this visit?  {YES/NO:21197}     Plan:     I have personally reviewed and noted the following in the patients chart:   Medical and social history Use of alcohol, tobacco or illicit drugs  Current medications and supplements including opioid prescriptions. {Opioid [phone]} Functional ability and status Nutritional status Physical activity Advanced directives List of other physicians Hospitalizations, surgeries, and ER visits in previous 12 months Vitals Screenings to include cognitive, depression, and falls Referrals and appointments  In addition, I have reviewed and discussed with patient certain preventive protocols, quality metrics, and best practice recommendations. A written personalized care plan for preventive services as well as general preventive health recommendations were provided to patient.   Due to this being a telephonic visit, the after visit summary with patients personalized plan was offered to patient via mail or my-chart. ***Patient declined at this time./ Patient would like to access on my-chart/ per request, patient was mailed a copy of AVS./ Patient preferred to pick up at office at next visit.   Loma Messing, LPN   08/13/5700   Nurse Health Advisor  Nurse Notes: none

## 2022-02-15 NOTE — Telephone Encounter (Signed)
PLEASE NOTE: All timestamps contained within this report are represented as [place] Standard Time. ?CONFIDENTIALTY NOTICE: This fax transmission is intended only for the addressee. It contains information that is legally privileged, confidential or otherwise protected from ?use or disclosure. If you are not the intended recipient, you are strictly prohibited from reviewing, disclosing, copying using or disseminating any of this information or taking any ?action in reliance on or regarding this information. If you have received this fax in error, please notify us immediately by telephone so that we can arrange for its return to [place]. ?Phone: [phone], Toll-Free: [phone], Fax: [phone] ?Page: 1 of 1 ?Call Id: [id number] ?[place] Night - Client ?Nonclinical Telephone Record  ?[facility]? ?Client [place] Night - Client ?Client Site [place] ?Provider Ria Bush - MD ?Contact Type Call ?Who Is Calling Patient / Member / Family / Caregiver ?Caller Name Keo Schirmer ?Caller Phone Number [phone] ?Patient Name Dominic Turner ?Patient DOB January 26, 1946 ?Call Type Message Only Information Provided ?Reason for Call Request to [facility] Appointment ?Initial Comment Caller states that he has to reschedule appointment for tomorrow ?Patient request to speak to RN No ?Disp. Time Disposition Final User ?02/15/2022 1:54:43 PM General Information Provided Yes [place], [place] ?Call Closed By: Gordy Levan ?Transaction Date/Time: 02/15/2022 1:51:35 PM (ET) ?

## 2022-02-16 ENCOUNTER — Ambulatory Visit: Payer: Medicare HMO

## 2022-02-17 ENCOUNTER — Other Ambulatory Visit: Payer: Medicare HMO

## 2022-02-17 ENCOUNTER — Ambulatory Visit: Payer: Medicare HMO | Admitting: Internal Medicine

## 2022-02-20 ENCOUNTER — Other Ambulatory Visit: Payer: Self-pay

## 2022-02-20 ENCOUNTER — Inpatient Hospital Stay (HOSPITAL_BASED_OUTPATIENT_CLINIC_OR_DEPARTMENT_OTHER): Payer: Medicare HMO | Admitting: Internal Medicine

## 2022-02-20 ENCOUNTER — Inpatient Hospital Stay: Payer: Medicare HMO | Attending: Internal Medicine

## 2022-02-20 ENCOUNTER — Encounter: Payer: Self-pay | Admitting: Internal Medicine

## 2022-02-20 VITALS — BP 141/66 | HR 76 | Temp 98.7°F | Resp 20 | Wt 196.6 lb

## 2022-02-20 DIAGNOSIS — D3A098 Benign carcinoid tumors of other sites: Secondary | ICD-10-CM

## 2022-02-20 DIAGNOSIS — I2510 Atherosclerotic heart disease of native coronary artery without angina pectoris: Secondary | ICD-10-CM | POA: Diagnosis not present

## 2022-02-20 DIAGNOSIS — E7800 Pure hypercholesterolemia, unspecified: Secondary | ICD-10-CM | POA: Diagnosis not present

## 2022-02-20 DIAGNOSIS — Z8249 Family history of ischemic heart disease and other diseases of the circulatory system: Secondary | ICD-10-CM | POA: Insufficient documentation

## 2022-02-20 DIAGNOSIS — Z832 Family history of diseases of the blood and blood-forming organs and certain disorders involving the immune mechanism: Secondary | ICD-10-CM | POA: Diagnosis not present

## 2022-02-20 DIAGNOSIS — F17210 Nicotine dependence, cigarettes, uncomplicated: Secondary | ICD-10-CM | POA: Diagnosis not present

## 2022-02-20 DIAGNOSIS — F17200 Nicotine dependence, unspecified, uncomplicated: Secondary | ICD-10-CM | POA: Diagnosis not present

## 2022-02-20 DIAGNOSIS — N400 Enlarged prostate without lower urinary tract symptoms: Secondary | ICD-10-CM | POA: Insufficient documentation

## 2022-02-20 DIAGNOSIS — Z79899 Other long term (current) drug therapy: Secondary | ICD-10-CM | POA: Insufficient documentation

## 2022-02-20 DIAGNOSIS — Z8349 Family history of other endocrine, nutritional and metabolic diseases: Secondary | ICD-10-CM | POA: Diagnosis not present

## 2022-02-20 DIAGNOSIS — Z833 Family history of diabetes mellitus: Secondary | ICD-10-CM | POA: Insufficient documentation

## 2022-02-20 DIAGNOSIS — I700 Atherosclerosis of aorta: Secondary | ICD-10-CM | POA: Diagnosis not present

## 2022-02-20 DIAGNOSIS — J449 Chronic obstructive pulmonary disease, unspecified: Secondary | ICD-10-CM | POA: Insufficient documentation

## 2022-02-20 DIAGNOSIS — C7A019 Malignant carcinoid tumor of the small intestine, unspecified portion: Secondary | ICD-10-CM | POA: Diagnosis not present

## 2022-02-20 DIAGNOSIS — Z8379 Family history of other diseases of the digestive system: Secondary | ICD-10-CM | POA: Insufficient documentation

## 2022-02-20 LAB — CBC WITH DIFFERENTIAL/PLATELET
Abs Immature Granulocytes: 0.03 10*3/uL (ref 0.00–0.07)
Basophils Absolute: 0 10*3/uL (ref 0.0–0.1)
Basophils Relative: 1 %
Eosinophils Absolute: 0.2 10*3/uL (ref 0.0–0.5)
Eosinophils Relative: 4 %
HCT: 44 % (ref 39.0–52.0)
Hemoglobin: 14.6 g/dL (ref 13.0–17.0)
Immature Granulocytes: 1 %
Lymphocytes Relative: 38 %
Lymphs Abs: 2 10*3/uL (ref 0.7–4.0)
MCH: 24.5 pg — ABNORMAL LOW (ref 26.0–34.0)
MCHC: 33.2 g/dL (ref 30.0–36.0)
MCV: 73.9 fL — ABNORMAL LOW (ref 80.0–100.0)
Monocytes Absolute: 0.5 10*3/uL (ref 0.1–1.0)
Monocytes Relative: 9 %
Neutro Abs: 2.5 10*3/uL (ref 1.7–7.7)
Neutrophils Relative %: 47 %
Platelets: 191 10*3/uL (ref 150–400)
RBC: 5.95 MIL/uL — ABNORMAL HIGH (ref 4.22–5.81)
RDW: 18.5 % — ABNORMAL HIGH (ref 11.5–15.5)
WBC: 5.2 10*3/uL (ref 4.0–10.5)
nRBC: 0 % (ref 0.0–0.2)

## 2022-02-20 LAB — COMPREHENSIVE METABOLIC PANEL
ALT: 12 U/L (ref 0–44)
AST: 15 U/L (ref 15–41)
Albumin: 3.8 g/dL (ref 3.5–5.0)
Alkaline Phosphatase: 86 U/L (ref 38–126)
Anion gap: 6 (ref 5–15)
BUN: 9 mg/dL (ref 8–23)
CO2: 28 mmol/L (ref 22–32)
Calcium: 9.2 mg/dL (ref 8.9–10.3)
Chloride: 101 mmol/L (ref 98–111)
Creatinine, Ser: 1.06 mg/dL (ref 0.61–1.24)
GFR, Estimated: >60 mL/min (ref >60)
Glucose, Bld: 97 mg/dL (ref 70–99)
Potassium: 3.1 mmol/L — ABNORMAL LOW (ref 3.5–5.1)
Sodium: 135 mmol/L (ref 135–145)
Total Bilirubin: 1 mg/dL (ref 0.3–1.2)
Total Protein: 6.2 g/dL — ABNORMAL LOW (ref 6.5–8.1)

## 2022-02-20 NOTE — Progress Notes (Signed)
Patient states no concerns today.

## 2022-02-20 NOTE — Progress Notes (Signed)
[facility] OFFICE PROGRESS NOTE  Patient Care Team: Dominic Bush, MD as PCP - General Dominic Bellows, MD as Consulting Physician (Gastroenterology) Dominic Sickle, MD as Consulting Physician (Hematology and Oncology)   Cancer Staging  No matching staging information was found for the patient.   Oncology History   No history exists.    MPRESSION: 1. 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 Adjacent mildly enlarged noncalcified right mesenteric node, new. 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 DOTATATE PET-CT could be considered for further characterization. 2. No evidence of bowel obstruction or acute bowel inflammation. Mild scattered colonic diverticulosis, with no evidence of acute diverticulitis. 3. Mild prostatomegaly. 4. Coronary atherosclerosis. 5. Aortic Atherosclerosis (ICD10-I70   Gallium PET OCT 5th, 2022-Partially calcified mass within the small bowel mesentery is tracer avid compatible with neuroendocrine receptor avid tumor.  Small focus of increased uptake within the ventral aspect of the right hemiabdomen localizing to a loop of small bowel.  DIAGNOSIS:  A. AMPULLARY MASS; COLD BIOPSY:  - DUODENAL MUCOSA WITH BRUNNER GLAND PROLIFERATIVE LESION AND OVERLYING  REACTIVE EPITHELIAL CHANGES.  - SEE COMMENT.  - HISTOLOGIC FEATURES OF NEUROENDOCRINE TUMOR ARE NOT IDENTIFIED.  - NEGATIVE FOR DYSPLASIA AND MALIGNANCY.   Comment:  The clinical concern for a neuroendocrine tumor is noted.  Brunner gland proliferative lesions include Brunner gland hyperplasia,  hamartoma, and adenoma. Since there is histologic overlap between these  entities and local excision is curative, the suggested nomenclature is  to report these as ``Brunner gland proliferative lesions.  Dysplasia  and malignancy are not identified in the submitted material.     HISTORY OF PRESENT ILLNESS: Patient alone.   Ambulating independently.  Dominic Turner 76 y.o.  male pleasant patient above history of history of chronic GI/abdominal discomfort clinically suggestive of low-grade carcinoid/small bowel is here for follow-up/.  Patient states no concerns today. No diarrhea. No bloating. Patient is back to his baseline health.   Review of Systems  Constitutional:  Negative for chills, diaphoresis, fever, malaise/fatigue and weight loss.  HENT:  Negative for nosebleeds and sore throat.   Eyes:  Negative for double vision.  Respiratory:  Negative for cough, hemoptysis, sputum production, shortness of breath and wheezing.   Cardiovascular:  Negative for chest pain, palpitations, orthopnea and leg swelling.  Gastrointestinal:  Negative for blood in stool, diarrhea, melena and vomiting.  Genitourinary:  Negative for dysuria, frequency and urgency.  Musculoskeletal:  Negative for back pain and joint pain.  Skin: Negative.  Negative for itching and rash.  Neurological:  Negative for dizziness, tingling, focal weakness, weakness and headaches.  Endo/Heme/Allergies:  Does not bruise/bleed easily.  Psychiatric/Behavioral:  Negative for depression. The patient is not nervous/anxious and does not have insomnia.      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 Post bronchodilator test not improved.   Diverticulosis 02/2013   by colonoscopy   Essential hypertension, benign    History of adenomatous polyp of colon 02/2013   rec rpt 3 yrs   Overweight(278.02)    Positive PPD, treated 2014   s/p rifampin x29mo  Pure  hypercholesterolemia    Tobacco use disorder    Unspecified  gastritis and gastroduodenitis without mention of hemorrhage    Unspecified sleep apnea     PAST SURGICAL HISTORY :   Past Surgical History:  Procedure Laterality Date   CATARACT EXTRACTION, BILATERAL Bilateral    CHOLECYSTECTOMY N/A 12/30/2015   Procedure: LAPAROSCOPIC CHOLECYSTECTOMY;  Surgeon: Florene Glen, MD;  Location: [facility] ORS;  Service: General;  Laterality: N/A;   COLONOSCOPY  03/03/2013   tubular adenoma x3, diverticulosis, rpt 3 yrs (Dr. Cristina Gong)   COLONOSCOPY  05/2016   TA x3, diverticulosis, single angiodysplastic lesion, rpt 3 yrs (Buccini)   COLONOSCOPY  09/2019   TA, HP, rpt 5 yrs (Buccini)   COLONOSCOPY WITH PROPOFOL N/A 10/12/2021   multiple TA, rpt 3 yrs Vicente Males, Bailey Mech, MD)   ESOPHAGOGASTRODUODENOSCOPY N/A 10/12/2021   chronic gastritis with + H pylori (treated with clarithromycin, amoxicillin, omeprazole x2wks), intestinal metaplasia present rpt 6 months Vicente Males, Bailey Mech, MD)   EUS N/A 11/10/2021   UPPER ENDOSCOPIC ULTRASOUND (EUS) LINEAR - nondiagnostic;  (Spaete, Lenetta Quaker, MD)    FAMILY HISTORY :   Family History  Problem Relation Age of Onset   Cirrhosis Father        died in 86's   Hypertension Mother    Heart attack Sister    Stroke Sister    Heart attack Maternal Grandmother    Stroke Maternal Grandmother    Thyroid disease Sister    Diabetes Sister    Cancer Neg Hx     SOCIAL HISTORY:   Social History   Tobacco Use   Smoking status: Every Day    Packs/day: 0.75    Years: 56.00    Pack years: 42.00    Types: Cigarettes   Smokeless tobacco: Never  Vaping Use   Vaping Use: Never used  Substance Use Topics   Alcohol use: No    Alcohol/week: 0.0 standard drinks   Drug use: No    ALLERGIES:  is allergic to aspirin, sertraline hcl, sulfonamide derivatives, and wellbutrin [bupropion].  MEDICATIONS:  Current Outpatient Medications  Medication Sig Dispense Refill   amLODipine (NORVASC) 10 MG tablet Take 1 tablet (10 mg total) by mouth  daily. 90 tablet 3   clobetasol (TEMOVATE) 0.05 % external solution Apply topically.     Docusate Calcium (STOOL SOFTENER PO) Take 1 tablet by mouth daily.     fluticasone (CUTIVATE) 0.05 % cream Apply topically. Apply to affected areas on face up to twice a day as needed     fluticasone (FLONASE) 50 MCG/ACT nasal spray USE 2 SPRAYS IN BOTH  NOSTRILS DAILY 48 g 3   loratadine (CLARITIN) 10 MG tablet Take 1 tablet (10 mg total) by mouth daily.     Multiple Vitamin Essential TABS Take 1 tablet by mouth daily.     omeprazole (PRILOSEC) 20 MG capsule Take 1 capsule (20 mg total) by mouth 2 (two) times daily before a meal for 14 days. 28 capsule 0   potassium chloride SA (KLOR-CON M) 20 MEQ tablet Take 1 tablet (20 mEq total) by mouth daily. 30 tablet 0   pravastatin (PRAVACHOL) 40 MG tablet Take 1 tablet (40 mg total) by mouth daily. 90 tablet 3   tamsulosin (FLOMAX) 0.4 MG CAPS capsule Take 1 capsule (0.4 mg total) by mouth daily. 90 capsule 3   tiotropium (SPIRIVA HANDIHALER) 18 MCG inhalation capsule INHALE THE CONTENTS OF 1  CAPSULE BY MOUTH VIA  HANDIHALER DAILY AT BEDTIME 90 capsule  3   traZODone (DESYREL) 50 MG tablet TAKE ONE AND ONE-HALF TABLETS BY MOUTH AT BEDTIME AS NEEDED     albuterol (PROVENTIL HFA;VENTOLIN HFA) 108 (90 Base) MCG/ACT inhaler Take 2 puffs in AM & PM every day for next 5 days, & also can use every 4-6 hours as needed. After 5 days, use only every 4-6 hrs as needed (Patient not taking: Reported on 12/13/2021) 1 Inhaler 0   ondansetron (ZOFRAN-ODT) 4 MG disintegrating tablet Take 1 tablet (4 mg total) by mouth every 8 (eight) hours as needed for nausea or vomiting. (Patient not taking: Reported on 12/13/2021) 20 tablet 0   polyethylene glycol powder (GLYCOLAX/MIRALAX) 17 GM/SCOOP powder Take 17 g by mouth daily. (Patient not taking: Reported on 11/17/2021) 3350 g 1   Current Facility-Administered Medications  Medication Dose Route Frequency Provider Last Rate Last Admin    albuterol (PROVENTIL) (2.5 MG/3ML) 0.083% nebulizer solution 2.5 mg  2.5 mg Nebulization Once Guse, Jacquelynn Cree, FNP       betamethasone acetate-betamethasone sodium phosphate (CELESTONE) injection 3 mg  3 mg Intramuscular Once Edrick Kins, DPM        PHYSICAL EXAMINATION: ECOG PERFORMANCE STATUS: 0 - Asymptomatic  BP (!) 141/66    Pulse 76    Temp 98.7 F (37.1 C)    Resp 20    Wt 196 lb 9.6 oz (89.2 kg)    SpO2 100%    BMI 29.03 kg/m   Filed Weights   02/20/22 1427  Weight: 196 lb 9.6 oz (89.2 kg)    Physical Exam Vitals and nursing note reviewed.  HENT:     Head: Normocephalic and atraumatic.     Mouth/Throat:     Pharynx: Oropharynx is clear.  Eyes:     Extraocular Movements: Extraocular movements intact.     Pupils: Pupils are equal, round, and reactive to light.  Cardiovascular:     Rate and Rhythm: Normal rate and regular rhythm.  Pulmonary:     Comments: Decreased breath sounds bilaterally.  Abdominal:     Palpations: Abdomen is soft.  Musculoskeletal:        General: Normal range of motion.     Cervical back: Normal range of motion.  Skin:    General: Skin is warm.  Neurological:     General: No focal deficit present.     Mental Status: He is alert and oriented to person, place, and time.  Psychiatric:        Behavior: Behavior normal.        Judgment: Judgment normal.       LABORATORY DATA:  I have reviewed the data as listed    Component Value Date/Time   NA 135 02/20/2022 1410   K 3.1 (L) 02/20/2022 1410   CL 101 02/20/2022 1410   CO2 28 02/20/2022 1410   GLUCOSE 97 02/20/2022 1410   BUN 9 02/20/2022 1410   CREATININE 1.06 02/20/2022 1410   CALCIUM 9.2 02/20/2022 1410   PROT 6.2 (L) 02/20/2022 1410   ALBUMIN 3.8 02/20/2022 1410   AST 15 02/20/2022 1410   ALT 12 02/20/2022 1410   ALKPHOS 86 02/20/2022 1410   BILITOT 1.0 02/20/2022 1410   GFRNONAA >60 02/20/2022 1410   GFRAA >60 03/30/2020 0958    No results found for: SPEP, UPEP  Lab  Results  Component Value Date   WBC 5.2 02/20/2022   NEUTROABS 2.5 02/20/2022   HGB 14.6 02/20/2022   HCT 44.0 02/20/2022   MCV 73.9 (  L) 02/20/2022   PLT 191 02/20/2022      Chemistry      Component Value Date/Time   NA 135 02/20/2022 1410   K 3.1 (L) 02/20/2022 1410   CL 101 02/20/2022 1410   CO2 28 02/20/2022 1410   BUN 9 02/20/2022 1410   CREATININE 1.06 02/20/2022 1410      Component Value Date/Time   CALCIUM 9.2 02/20/2022 1410   ALKPHOS 86 02/20/2022 1410   AST 15 02/20/2022 1410   ALT 12 02/20/2022 1410   BILITOT 1.0 02/20/2022 1410       RADIOGRAPHIC STUDIES: I have personally reviewed the radiological images as listed and agreed with the findings in the report. No results found.   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 Adjacent mildly enlarged noncalcified right mesenteric node, new.  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  Small focus of increased uptake within the ventral aspect of the right hemiabdomen localizing to a loop of small bowel.  NOV 2022 -EGD -duodenal submucosal lesions [not biopsied].  Endoscopic ultrasound December 2022-negative for malignancy.  #Given the low volume/asymtomatic disease [hx C. difficile infection/chronic dyspepsia-I think is reasonable to hold off any Sandostatin at this time.  Recommend surveillance.  Will decide on the scan at the next visit.   #Chronic dyspepsia-H. pylori infection s/p antibiotics; improved.  Complicated by C. Difficile/s/p treatment resolved.  # Smoking: Again recommended quitting smoking. Discussed re: Lung cancer screening . Discussed regarding lung cancer screening program at length; which includes low-dose CT scan on annual basis for 5 years.  Based upon  the findings patient would be recommended surveillance/biopsies/or surgery.  Lung cancer program has shown to save lives by early detection of lung cancer.  Patient was asked to inform us if she was interested in pursuing the program. Will referral.   # DISPOSITION: # referral to Lung cancer screening program re: hx of smoking # follow up in 3 months- MD; labs- cbc/cmp;Chromogranin-A- Dr.B        Orders Placed This Encounter  Procedures   CBC with Differential/Platelet    Standing Status:   Future    Standing Expiration Date:   02/21/2023   Comprehensive metabolic panel    Standing Status:   Future    Standing Expiration Date:   02/21/2023   Chromogranin A    Standing Status:   Future    Standing Expiration Date:   02/21/2023   Ambulatory Referral for Lung Cancer Scre    Referral Priority:   Routine    Referral Type:   Consultation    Referral Reason:   Specialty Services Required    Number of Visits Requested:   1     All questions were answered. The patient knows to call the clinic with any problems, questions or concerns.      Dominic Sickle, MD 02/20/2022 4:04 PM

## 2022-02-20 NOTE — Assessment & Plan Note (Addendum)
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 Adjacent mildly enlarged noncalcified right mesenteric node, new.  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  Small focus of increased uptake within the ventral aspect of the right hemiabdomen localizing to a loop of small bowel.  NOV 2022 -EGD -duodenal submucosal lesions [not biopsied].  Endoscopic ultrasound December 2022-negative for malignancy. ? ?#Given the low volume/asymtomatic disease [hx C. difficile infection/chronic dyspepsia-I think is reasonable to hold off any Sandostatin at this time.  Recommend surveillance.  Will decide on the scan at the next visit.  ? ?#Chronic dyspepsia-H. pylori infection s/p antibiotics; improved.  Complicated by C. Difficile/s/p treatment resolved. ? ?# Smoking: Again recommended quitting smoking. Discussed re: Lung cancer screening . Discussed regarding lung cancer screening program at length; which includes low-dose CT scan on annual basis for 5 years.  Based upon the findings patient would be recommended surveillance/biopsies/or surgery.  Lung cancer program has shown to save lives by early detection of lung cancer.  Patient was asked to inform us if she was interested in pursuing the program. Will referral.  ? ?# DISPOSITION: ?# referral to Lung cancer screening program re: hx of smoking ?# follow up in 3 months- MD; labs- cbc/cmp;Chromogranin-A- Dr.B  ? ? ? ? ?

## 2022-02-22 ENCOUNTER — Other Ambulatory Visit: Payer: Self-pay

## 2022-02-22 ENCOUNTER — Inpatient Hospital Stay: Payer: Medicare HMO

## 2022-02-22 NOTE — Progress Notes (Signed)
Nutrition Follow-up: ? ?Patient with mesenteric mass NET, nonfunctional.  Currently followed by Dr Rogue Bussing and not on treatment.   ? ?Met with patient in clinic for follow-up.  Patient says that his appetite is better. He is not able to eat full meals vs just a few bites.  He denies diarrhea and not having to take stool softner/miralax to keep bowels moving as he did prior to c-diff colitis.  Says that he is eating 3 meals per day.  Yesterday was bacon, egg sandwich. Lunch was 2 hot dogs with drink and supper last night was beef steak with rice.   ? ? ? ?Medications: reviewed ? ?Labs: reviewed ? ?Anthropometrics:  ? ?Weight 195 lb 6 oz today ? ?196 lb 9.6 oz on 3/13 ? ?197 lb 6 oz on 1/12 ?221 lb on 10/7 ? ? ?NUTRITION DIAGNOSIS: Inadequate oral intake improved ? ? ? ?INTERVENTION:  ?Would anticipate weight to increase with increase in appetite and adding in lunch each day.  Encouraged patient to add snacks between meals if able to prevent weight loss.   ?Contact information provided to patient.  Patient will contact RD if has nutrition questions or concerns in the future.  ? ? ? ?NEXT VISIT: no follow-up ? ?Rufino Staup B. Zenia Resides, RD, LDN ?Registered Dietitian ?[phone] (mobile) ? ? ?

## 2022-02-23 LAB — CHROMOGRANIN A: Chromogranin A (ng/mL): 112.8 ng/mL — ABNORMAL HIGH (ref 0.0–101.8)

## 2022-03-14 ENCOUNTER — Encounter: Payer: Self-pay | Admitting: Internal Medicine

## 2022-04-06 ENCOUNTER — Other Ambulatory Visit: Payer: Self-pay

## 2022-04-06 DIAGNOSIS — Z122 Encounter for screening for malignant neoplasm of respiratory organs: Secondary | ICD-10-CM

## 2022-04-06 DIAGNOSIS — F17210 Nicotine dependence, cigarettes, uncomplicated: Secondary | ICD-10-CM

## 2022-04-06 DIAGNOSIS — Z87891 Personal history of nicotine dependence: Secondary | ICD-10-CM

## 2022-05-02 ENCOUNTER — Telehealth: Payer: Self-pay | Admitting: Family Medicine

## 2022-05-02 NOTE — Telephone Encounter (Signed)
Spoke with patient to schedule AWV  He stated he goes to [place] for his care

## 2022-05-23 ENCOUNTER — Inpatient Hospital Stay: Payer: Medicare HMO

## 2022-05-23 ENCOUNTER — Inpatient Hospital Stay: Payer: Medicare HMO | Admitting: Internal Medicine

## 2022-05-23 NOTE — Progress Notes (Deleted)
[facility] OFFICE PROGRESS NOTE  Patient Care Team: Ria Bush, MD as PCP - General [name], MD as Consulting Physician (Gastroenterology) Cammie Sickle, MD as Consulting Physician (Hematology and Oncology)   Cancer Staging  No matching staging information was found for the patient.   Oncology History   No history exists.    MPRESSION: 1. 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 Adjacent mildly enlarged noncalcified right mesenteric node, new. 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 DOTATATE PET-CT could be considered for further characterization. 2. No evidence of bowel obstruction or acute bowel inflammation. Mild scattered colonic diverticulosis, with no evidence of acute diverticulitis. 3. Mild prostatomegaly. 4. Coronary atherosclerosis. 5. Aortic Atherosclerosis (ICD10-I70   Gallium PET OCT 5th, 2022-Partially calcified mass within the small bowel mesentery is tracer avid compatible with neuroendocrine receptor avid tumor.  Small focus of increased uptake within the ventral aspect of the right hemiabdomen localizing to a loop of small bowel.  DIAGNOSIS:  A. AMPULLARY MASS; COLD BIOPSY:  - DUODENAL MUCOSA WITH BRUNNER GLAND PROLIFERATIVE LESION AND OVERLYING  REACTIVE EPITHELIAL CHANGES.  - SEE COMMENT.  - HISTOLOGIC FEATURES OF NEUROENDOCRINE TUMOR ARE NOT IDENTIFIED.  - NEGATIVE FOR DYSPLASIA AND MALIGNANCY.   Comment:  The clinical concern for a neuroendocrine tumor is noted.  Brunner gland proliferative lesions include Brunner gland hyperplasia,  hamartoma, and adenoma. Since there is histologic overlap between these  entities and local excision is curative, the suggested nomenclature is  to report these as ``Brunner gland proliferative lesions.  Dysplasia  and malignancy are not identified in the submitted material.     HISTORY OF PRESENT ILLNESS: Patient alone.   Ambulating independently.  Dominic Turner 76 y.o.  male pleasant patient above history of history of chronic GI/abdominal discomfort clinically suggestive of low-grade carcinoid/small bowel is here for follow-up/.  Patient states no concerns today. No diarrhea. No bloating. Patient is back to his baseline health.   Review of Systems  Constitutional:  Negative for chills, diaphoresis, fever, malaise/fatigue and weight loss.  HENT:  Negative for nosebleeds and sore throat.   Eyes:  Negative for double vision.  Respiratory:  Negative for cough, hemoptysis, sputum production, shortness of breath and wheezing.   Cardiovascular:  Negative for chest pain, palpitations, orthopnea and leg swelling.  Gastrointestinal:  Negative for blood in stool, diarrhea, melena and vomiting.  Genitourinary:  Negative for dysuria, frequency and urgency.  Musculoskeletal:  Negative for back pain and joint pain.  Skin: Negative.  Negative for itching and rash.  Neurological:  Negative for dizziness, tingling, focal weakness, weakness and headaches.  Endo/Heme/Allergies:  Does not bruise/bleed easily.  Psychiatric/Behavioral:  Negative for depression. The patient is not nervous/anxious and does not have insomnia.      PAST MEDICAL HISTORY :  Past Medical History:  Diagnosis Date  . Acute cholecystitis 12/2015  . ANXIETY DEPRESSION 10/23/2008  . 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 Bilateral high frequency sensorineural hearing loss    rec hearing aides by ENT  . 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 Post bronchodilator test not improved.  . Diverticulosis 02/2013   by colonoscopy  . Essential hypertension, benign   . History of adenomatous polyp of colon 02/2013   rec rpt 3 yrs  . Overweight(278.02)   . 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 Pure  hypercholesterolemia   . Tobacco use disorder   .  Unspecified gastritis and gastroduodenitis without mention of hemorrhage   . Unspecified sleep apnea     PAST SURGICAL HISTORY :   Past Surgical History:  Procedure Laterality Date  . CATARACT EXTRACTION, BILATERAL Bilateral   . CHOLECYSTECTOMY N/A 12/30/2015   Procedure: LAPAROSCOPIC CHOLECYSTECTOMY;  Surgeon: Florene Glen, MD;  Location: [facility] ORS;  Service: General;  Laterality: N/A;  . COLONOSCOPY  03/03/2013   tubular adenoma x3, diverticulosis, rpt 3 yrs (Dr. Cristina Gong)  . COLONOSCOPY  05/2016   TA x3, diverticulosis, single angiodysplastic lesion, rpt 3 yrs (Buccini)  . COLONOSCOPY  09/2019   TA, HP, rpt 5 yrs (Buccini)  . COLONOSCOPY WITH PROPOFOL N/A 10/12/2021   multiple TA, rpt 3 yrs Vicente Males, Bailey Mech, MD)  . ESOPHAGOGASTRODUODENOSCOPY N/A 10/12/2021   chronic gastritis with + H pylori (treated with clarithromycin, amoxicillin, omeprazole x2wks), intestinal metaplasia present rpt 6 months [name], MD)  . EUS N/A 11/10/2021   UPPER ENDOSCOPIC ULTRASOUND (EUS) LINEAR - nondiagnostic;  (Spaete, Lenetta Quaker, MD)    FAMILY HISTORY :   Family History  Problem Relation Age of Onset  . Cirrhosis Father        died in 38's  . Hypertension Mother   . Heart attack Sister   . Stroke Sister   . Heart attack Maternal Grandmother   . Stroke Maternal Grandmother   . Thyroid disease Sister   . Diabetes Sister   . Cancer Neg Hx     SOCIAL HISTORY:   Social History   Tobacco Use  . Smoking status: Every Day    Packs/day: 0.75    Years: 56.00    Total pack years: 42.00    Types: Cigarettes  . Smokeless tobacco: Never  Vaping Use  . Vaping Use: Never used  Substance Use Topics  . Alcohol use: No    Alcohol/week: 0.0 standard drinks of alcohol  . Drug use: No    ALLERGIES:  is allergic to aspirin, sertraline hcl, sulfonamide derivatives, and wellbutrin [bupropion].  MEDICATIONS:  Current Outpatient Medications  Medication Sig Dispense Refill  . albuterol (PROVENTIL  HFA;VENTOLIN HFA) 108 (90 Base) MCG/ACT inhaler Take 2 puffs in AM & PM every day for next 5 days, & also can use every 4-6 hours as needed. After 5 days, use only every 4-6 hrs as needed (Patient not taking: Reported on 12/13/2021) 1 Inhaler 0  . amLODipine (NORVASC) 10 MG tablet Take 1 tablet (10 mg total) by mouth daily. 90 tablet 3  . clobetasol (TEMOVATE) 0.05 % external solution Apply topically.    Mariane Baumgarten Calcium (STOOL SOFTENER PO) Take 1 tablet by mouth daily.    . fluticasone (CUTIVATE) 0.05 % cream Apply topically. Apply to affected areas on face up to twice a day as needed    . fluticasone (FLONASE) 50 MCG/ACT nasal spray USE 2 SPRAYS IN BOTH  NOSTRILS DAILY 48 g 3  . loratadine (CLARITIN) 10 MG tablet Take 1 tablet (10 mg total) by mouth daily.    . Multiple Vitamin Essential TABS Take 1 tablet by mouth daily.    Marland Kitchen omeprazole (PRILOSEC) 20 MG capsule Take 1 capsule (20 mg total) by mouth 2 (two) times daily before a meal for 14 days. 28 capsule 0  . ondansetron (ZOFRAN-ODT) 4 MG disintegrating tablet Take 1 tablet (4 mg total) by mouth every 8 (eight) hours as needed for nausea or vomiting. (Patient not taking: Reported on 12/13/2021)  20 tablet 0  . polyethylene glycol powder (GLYCOLAX/MIRALAX) 17 GM/SCOOP powder Take 17 g by mouth daily. (Patient not taking: Reported on 11/17/2021) 3350 g 1  . potassium chloride SA (KLOR-CON M) 20 MEQ tablet Take 1 tablet (20 mEq total) by mouth daily. 30 tablet 0  . pravastatin (PRAVACHOL) 40 MG tablet Take 1 tablet (40 mg total) by mouth daily. 90 tablet 3  . tamsulosin (FLOMAX) 0.4 MG CAPS capsule Take 1 capsule (0.4 mg total) by mouth daily. 90 capsule 3  . tiotropium (SPIRIVA HANDIHALER) 18 MCG inhalation capsule INHALE THE CONTENTS OF 1  CAPSULE BY MOUTH VIA  HANDIHALER DAILY AT BEDTIME 90 capsule 3  . traZODone (DESYREL) 50 MG tablet TAKE ONE AND ONE-HALF TABLETS BY MOUTH AT BEDTIME AS NEEDED     Current Facility-Administered Medications   Medication Dose Route Frequency Provider Last Rate Last Admin  . albuterol (PROVENTIL) (2.5 MG/3ML) 0.083% nebulizer solution 2.5 mg  2.5 mg Nebulization Once Guse, Jacquelynn Cree, FNP      . betamethasone acetate-betamethasone sodium phosphate (CELESTONE) injection 3 mg  3 mg Intramuscular Once Edrick Kins, DPM        PHYSICAL EXAMINATION: ECOG PERFORMANCE STATUS: 0 - Asymptomatic  There were no vitals taken for this visit.  There were no vitals filed for this visit.   Physical Exam Vitals and nursing note reviewed.  HENT:     Head: Normocephalic and atraumatic.     Mouth/Throat:     Pharynx: Oropharynx is clear.  Eyes:     Extraocular Movements: Extraocular movements intact.     Pupils: Pupils are equal, round, and reactive to light.  Cardiovascular:     Rate and Rhythm: Normal rate and regular rhythm.  Pulmonary:     Comments: Decreased breath sounds bilaterally.  Abdominal:     Palpations: Abdomen is soft.  Musculoskeletal:        General: Normal range of motion.     Cervical back: Normal range of motion.  Skin:    General: Skin is warm.  Neurological:     General: No focal deficit present.     Mental Status: He is alert and oriented to person, place, and time.  Psychiatric:        Behavior: Behavior normal.        Judgment: Judgment normal.       LABORATORY DATA:  I have reviewed the data as listed    Component Value Date/Time   NA 135 02/20/2022 1410   K 3.1 (L) 02/20/2022 1410   CL 101 02/20/2022 1410   CO2 28 02/20/2022 1410   GLUCOSE 97 02/20/2022 1410   BUN 9 02/20/2022 1410   CREATININE 1.06 02/20/2022 1410   CALCIUM 9.2 02/20/2022 1410   PROT 6.2 (L) 02/20/2022 1410   ALBUMIN 3.8 02/20/2022 1410   AST 15 02/20/2022 1410   ALT 12 02/20/2022 1410   ALKPHOS 86 02/20/2022 1410   BILITOT 1.0 02/20/2022 1410   GFRNONAA >60 02/20/2022 1410   GFRAA >60 03/30/2020 0958    No results found for: "SPEP", "UPEP"  Lab Results  Component Value Date    WBC 5.2 02/20/2022   NEUTROABS 2.5 02/20/2022   HGB 14.6 02/20/2022   HCT 44.0 02/20/2022   MCV 73.9 (L) 02/20/2022   PLT 191 02/20/2022      Chemistry      Component Value Date/Time   NA 135 02/20/2022 1410   K 3.1 (L) 02/20/2022 1410   CL 101 02/20/2022  1410   CO2 28 02/20/2022 1410   BUN 9 02/20/2022 1410   CREATININE 1.06 02/20/2022 1410      Component Value Date/Time   CALCIUM 9.2 02/20/2022 1410   ALKPHOS 86 02/20/2022 1410   AST 15 02/20/2022 1410   ALT 12 02/20/2022 1410   BILITOT 1.0 02/20/2022 1410       RADIOGRAPHIC STUDIES: I have personally reviewed the radiological images as listed and agreed with the findings in the report. No results found.   ASSESSMENT & PLAN:  No problem-specific Assessment & Plan notes found for this encounter.    No orders of the defined types were placed in this encounter.    All questions were answered. The patient knows to call the clinic with any problems, questions or concerns.      Cammie Sickle, MD 05/23/2022 8:49 AM

## 2022-05-23 NOTE — Assessment & Plan Note (Deleted)
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 Adjacent mildly enlarged noncalcified right mesenteric node, new.  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  Small focus of increased uptake within the ventral aspect of the right hemiabdomen localizing to a loop of small bowel.  NOV 2022 -EGD -duodenal submucosal lesions [not biopsied].  Endoscopic ultrasound December 2022-negative for malignancy.  #Given the low volume/asymtomatic disease [hx C. difficile infection/chronic dyspepsia-I think is reasonable to hold off any Sandostatin at this time.  Recommend surveillance.  Will decide on the scan at the next visit.   #Chronic dyspepsia-H. pylori infection s/p antibiotics; improved.  Complicated by C. Difficile/s/p treatment resolved.  # Smoking: Again recommended quitting smoking. Discussed re: Lung cancer screening . Discussed regarding lung cancer screening program at length; which includes low-dose CT scan on annual basis for 5 years.  Based upon the findings patient would be recommended surveillance/biopsies/or surgery.  Lung cancer program has shown to save lives by early detection of lung cancer.  Patient was asked to inform us if she was interested in pursuing the program. Will referral.   # DISPOSITION: # referral to Lung cancer screening program re: hx of smoking # follow up in 3 months- MD; labs- cbc/cmp;Chromogranin-A- Dr.B

## 2022-05-31 ENCOUNTER — Inpatient Hospital Stay: Payer: Medicare HMO | Attending: Internal Medicine

## 2022-05-31 ENCOUNTER — Encounter: Payer: Self-pay | Admitting: Internal Medicine

## 2022-05-31 ENCOUNTER — Inpatient Hospital Stay: Payer: Medicare HMO | Admitting: Internal Medicine

## 2022-05-31 DIAGNOSIS — Z8379 Family history of other diseases of the digestive system: Secondary | ICD-10-CM | POA: Diagnosis not present

## 2022-05-31 DIAGNOSIS — Z8719 Personal history of other diseases of the digestive system: Secondary | ICD-10-CM | POA: Insufficient documentation

## 2022-05-31 DIAGNOSIS — E7800 Pure hypercholesterolemia, unspecified: Secondary | ICD-10-CM | POA: Insufficient documentation

## 2022-05-31 DIAGNOSIS — I2510 Atherosclerotic heart disease of native coronary artery without angina pectoris: Secondary | ICD-10-CM | POA: Insufficient documentation

## 2022-05-31 DIAGNOSIS — F17210 Nicotine dependence, cigarettes, uncomplicated: Secondary | ICD-10-CM | POA: Insufficient documentation

## 2022-05-31 DIAGNOSIS — K649 Unspecified hemorrhoids: Secondary | ICD-10-CM | POA: Diagnosis not present

## 2022-05-31 DIAGNOSIS — Z888 Allergy status to other drugs, medicaments and biological substances status: Secondary | ICD-10-CM | POA: Insufficient documentation

## 2022-05-31 DIAGNOSIS — Z833 Family history of diabetes mellitus: Secondary | ICD-10-CM | POA: Diagnosis not present

## 2022-05-31 DIAGNOSIS — Z8349 Family history of other endocrine, nutritional and metabolic diseases: Secondary | ICD-10-CM | POA: Insufficient documentation

## 2022-05-31 DIAGNOSIS — I10 Essential (primary) hypertension: Secondary | ICD-10-CM | POA: Diagnosis not present

## 2022-05-31 DIAGNOSIS — Z86010 Personal history of colonic polyps: Secondary | ICD-10-CM | POA: Diagnosis not present

## 2022-05-31 DIAGNOSIS — Z882 Allergy status to sulfonamides status: Secondary | ICD-10-CM | POA: Insufficient documentation

## 2022-05-31 DIAGNOSIS — N400 Enlarged prostate without lower urinary tract symptoms: Secondary | ICD-10-CM | POA: Diagnosis not present

## 2022-05-31 DIAGNOSIS — Z79899 Other long term (current) drug therapy: Secondary | ICD-10-CM | POA: Insufficient documentation

## 2022-05-31 DIAGNOSIS — Z9049 Acquired absence of other specified parts of digestive tract: Secondary | ICD-10-CM | POA: Diagnosis not present

## 2022-05-31 DIAGNOSIS — Z8249 Family history of ischemic heart disease and other diseases of the circulatory system: Secondary | ICD-10-CM | POA: Diagnosis not present

## 2022-05-31 DIAGNOSIS — D3A098 Benign carcinoid tumors of other sites: Secondary | ICD-10-CM | POA: Diagnosis not present

## 2022-05-31 DIAGNOSIS — Z886 Allergy status to analgesic agent status: Secondary | ICD-10-CM | POA: Diagnosis not present

## 2022-05-31 DIAGNOSIS — I700 Atherosclerosis of aorta: Secondary | ICD-10-CM | POA: Diagnosis not present

## 2022-05-31 DIAGNOSIS — C7A019 Malignant carcinoid tumor of the small intestine, unspecified portion: Secondary | ICD-10-CM | POA: Diagnosis not present

## 2022-05-31 DIAGNOSIS — Z823 Family history of stroke: Secondary | ICD-10-CM | POA: Diagnosis not present

## 2022-05-31 LAB — CBC WITH DIFFERENTIAL/PLATELET
Abs Immature Granulocytes: 0.02 10*3/uL (ref 0.00–0.07)
Basophils Absolute: 0.1 10*3/uL (ref 0.0–0.1)
Basophils Relative: 1 %
Eosinophils Absolute: 0.3 10*3/uL (ref 0.0–0.5)
Eosinophils Relative: 6 %
HCT: 47.3 % (ref 39.0–52.0)
Hemoglobin: 15.7 g/dL (ref 13.0–17.0)
Immature Granulocytes: 0 %
Lymphocytes Relative: 41 %
Lymphs Abs: 2.2 10*3/uL (ref 0.7–4.0)
MCH: 24.9 pg — ABNORMAL LOW (ref 26.0–34.0)
MCHC: 33.2 g/dL (ref 30.0–36.0)
MCV: 75 fL — ABNORMAL LOW (ref 80.0–100.0)
Monocytes Absolute: 0.6 10*3/uL (ref 0.1–1.0)
Monocytes Relative: 11 %
Neutro Abs: 2.2 10*3/uL (ref 1.7–7.7)
Neutrophils Relative %: 41 %
Platelets: 178 10*3/uL (ref 150–400)
RBC: 6.31 MIL/uL — ABNORMAL HIGH (ref 4.22–5.81)
RDW: 18 % — ABNORMAL HIGH (ref 11.5–15.5)
WBC: 5.3 10*3/uL (ref 4.0–10.5)
nRBC: 0 % (ref 0.0–0.2)

## 2022-05-31 LAB — COMPREHENSIVE METABOLIC PANEL
ALT: 14 U/L (ref 0–44)
AST: 17 U/L (ref 15–41)
Albumin: 3.8 g/dL (ref 3.5–5.0)
Alkaline Phosphatase: 97 U/L (ref 38–126)
Anion gap: 7 (ref 5–15)
BUN: 13 mg/dL (ref 8–23)
CO2: 27 mmol/L (ref 22–32)
Calcium: 9.5 mg/dL (ref 8.9–10.3)
Chloride: 102 mmol/L (ref 98–111)
Creatinine, Ser: 1.26 mg/dL — ABNORMAL HIGH (ref 0.61–1.24)
GFR, Estimated: 60 mL/min — ABNORMAL LOW (ref >60)
Glucose, Bld: 97 mg/dL (ref 70–99)
Potassium: 3.8 mmol/L (ref 3.5–5.1)
Sodium: 136 mmol/L (ref 135–145)
Total Bilirubin: 0.8 mg/dL (ref 0.3–1.2)
Total Protein: 6.5 g/dL (ref 6.5–8.1)

## 2022-05-31 NOTE — Assessment & Plan Note (Addendum)
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 Adjacent mildly enlarged noncalcified right mesenteric node, new.  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  Small focus of increased uptake within the ventral aspect of the right hemiabdomen localizing to a loop of small bowel.  NOV 2022 -EGD -duodenal submucosal lesions [not biopsied].  Endoscopic ultrasound December 2022-negative for malignancy.  #Given the low volume/asymtomatic disease [hx C. difficile infection/chronic dyspepsia-I think is reasonable to hold off any Sandostatin at this time. Continue surveillance. STABLE.   # Hemorrhoidal Bleeding: defer to GI.   # Smoking: Again recommended quitting smoking. S/p Low dose CT- APRIL 2023- WNL.   # DISPOSITION: # follow up in 4 months- MD; labs- cbc/cmp;Chromogranin-A- Dr.B

## 2022-05-31 NOTE — Progress Notes (Signed)
[place] OFFICE PROGRESS NOTE  Patient Care Team: Ria Bush, MD as PCP - General [name], MD as Consulting Physician (Gastroenterology) Cammie Sickle, MD as Consulting Physician (Hematology and Oncology)   Cancer Staging  No matching staging information was found for the patient.   Oncology History   No history exists.    MPRESSION: 1. 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 Adjacent mildly enlarged noncalcified right mesenteric node, new. 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 DOTATATE PET-CT could be considered for further characterization. 2. No evidence of bowel obstruction or acute bowel inflammation. Mild scattered colonic diverticulosis, with no evidence of acute diverticulitis. 3. Mild prostatomegaly. 4. Coronary atherosclerosis. 5. Aortic Atherosclerosis (ICD10-I70   Gallium PET OCT 5th, 2022-Partially calcified mass within the small bowel mesentery is tracer avid compatible with neuroendocrine receptor avid tumor.  Small focus of increased uptake within the ventral aspect of the right hemiabdomen localizing to a loop of small bowel.  DIAGNOSIS:  A. AMPULLARY MASS; COLD BIOPSY:  - DUODENAL MUCOSA WITH BRUNNER GLAND PROLIFERATIVE LESION AND OVERLYING  REACTIVE EPITHELIAL CHANGES.  - SEE COMMENT.  - HISTOLOGIC FEATURES OF NEUROENDOCRINE TUMOR ARE NOT IDENTIFIED.  - NEGATIVE FOR DYSPLASIA AND MALIGNANCY.   Comment:  The clinical concern for a neuroendocrine tumor is noted.  Brunner gland proliferative lesions include Brunner gland hyperplasia,  hamartoma, and adenoma. Since there is histologic overlap between these  entities and local excision is curative, the suggested nomenclature is  to report these as ``Brunner gland proliferative lesions.  Dysplasia  and malignancy are not identified in the submitted material.     HISTORY OF PRESENT ILLNESS: Patient alone.   Ambulating independently.  Dominic Turner 76 y.o.  male pleasant patient above history of history of chronic GI/abdominal discomfort clinically suggestive of low-grade carcinoid/small bowel is here for follow-up/.  Patient states no concerns today. No diarrhea. No bloating. Patient is back to his baseline health.   Review of Systems  Constitutional:  Negative for chills, diaphoresis, fever, malaise/fatigue and weight loss.  HENT:  Negative for nosebleeds and sore throat.   Eyes:  Negative for double vision.  Respiratory:  Negative for cough, hemoptysis, sputum production, shortness of breath and wheezing.   Cardiovascular:  Negative for chest pain, palpitations, orthopnea and leg swelling.  Gastrointestinal:  Negative for blood in stool, diarrhea, melena and vomiting.  Genitourinary:  Negative for dysuria, frequency and urgency.  Musculoskeletal:  Negative for back pain and joint pain.  Skin: Negative.  Negative for itching and rash.  Neurological:  Negative for dizziness, tingling, focal weakness, weakness and headaches.  Endo/Heme/Allergies:  Does not bruise/bleed easily.  Psychiatric/Behavioral:  Negative for depression. The patient is not nervous/anxious and does not have insomnia.       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 Post bronchodilator test not improved.   Diverticulosis 02/2013   by colonoscopy   Essential hypertension, benign    History of adenomatous polyp of colon 02/2013   rec rpt 3 yrs   Overweight(278.02)    Positive PPD, treated 2014   s/p rifampin x69mo  Pure hypercholesterolemia    Tobacco use disorder    Unspecified  gastritis and gastroduodenitis without mention of hemorrhage    Unspecified sleep apnea     PAST SURGICAL HISTORY :   Past Surgical History:  Procedure Laterality Date   CATARACT EXTRACTION, BILATERAL Bilateral    CHOLECYSTECTOMY N/A 12/30/2015   Procedure: LAPAROSCOPIC CHOLECYSTECTOMY;  Surgeon: Florene Glen, MD;  Location: [facility] ORS;  Service: General;  Laterality: N/A;   COLONOSCOPY  03/03/2013   tubular adenoma x3, diverticulosis, rpt 3 yrs (Dr. Cristina Gong)   COLONOSCOPY  05/2016   TA x3, diverticulosis, single angiodysplastic lesion, rpt 3 yrs (Buccini)   COLONOSCOPY  09/2019   TA, HP, rpt 5 yrs (Buccini)   COLONOSCOPY WITH PROPOFOL N/A 10/12/2021   multiple TA, rpt 3 yrs Vicente Males, Bailey Mech, MD)   ESOPHAGOGASTRODUODENOSCOPY N/A 10/12/2021   chronic gastritis with + H pylori (treated with clarithromycin, amoxicillin, omeprazole x2wks), intestinal metaplasia present rpt 6 months Vicente Males, Bailey Mech, MD)   EUS N/A 11/10/2021   UPPER ENDOSCOPIC ULTRASOUND (EUS) LINEAR - nondiagnostic;  (Spaete, Lenetta Quaker, MD)    FAMILY HISTORY :   Family History  Problem Relation Age of Onset   Cirrhosis Father        died in 79's   Hypertension Mother    Heart attack Sister    Stroke Sister    Heart attack Maternal Grandmother    Stroke Maternal Grandmother    Thyroid disease Sister    Diabetes Sister    Cancer Neg Hx     SOCIAL HISTORY:   Social History   Tobacco Use   Smoking status: Every Day    Packs/day: 0.75    Years: 56.00    Total pack years: 42.00    Types: Cigarettes   Smokeless tobacco: Never  Vaping Use   Vaping Use: Never used  Substance Use Topics   Alcohol use: No    Alcohol/week: 0.0 standard drinks of alcohol   Drug use: No    ALLERGIES:  is allergic to aspirin, sertraline hcl, sulfonamide derivatives, and wellbutrin [bupropion].  MEDICATIONS:  Current Outpatient Medications  Medication Sig Dispense Refill   amLODipine (NORVASC) 10 MG tablet Take 1 tablet (10 mg  total) by mouth daily. 90 tablet 3   clobetasol (TEMOVATE) 0.05 % external solution Apply topically.     Docusate Calcium (STOOL SOFTENER PO) Take 1 tablet by mouth daily.     fluticasone (CUTIVATE) 0.05 % cream Apply topically. Apply to affected areas on face up to twice a day as needed     fluticasone (FLONASE) 50 MCG/ACT nasal spray USE 2 SPRAYS IN BOTH  NOSTRILS DAILY 48 g 3   loratadine (CLARITIN) 10 MG tablet Take 1 tablet (10 mg total) by mouth daily.     Multiple Vitamin Essential TABS Take 1 tablet by mouth daily.     omeprazole (PRILOSEC) 20 MG capsule Take 1 capsule (20 mg total) by mouth 2 (two) times daily before a meal for 14 days. 28 capsule 0   polyethylene glycol powder (GLYCOLAX/MIRALAX) 17 GM/SCOOP powder Take 17 g by mouth daily. 3350 g 1   potassium chloride SA (KLOR-CON M) 20 MEQ tablet Take 1 tablet (20 mEq total) by mouth daily. 30 tablet 0   pravastatin (PRAVACHOL) 40 MG tablet Take 1 tablet (40 mg total) by mouth daily. 90 tablet 3   tamsulosin (FLOMAX) 0.4 MG CAPS capsule Take 1 capsule (0.4 mg total) by mouth daily. 90 capsule 3   tiotropium (SPIRIVA  HANDIHALER) 18 MCG inhalation capsule INHALE THE CONTENTS OF 1  CAPSULE BY MOUTH VIA  HANDIHALER DAILY AT BEDTIME 90 capsule 3   traZODone (DESYREL) 50 MG tablet TAKE ONE AND ONE-HALF TABLETS BY MOUTH AT BEDTIME AS NEEDED     albuterol (PROVENTIL HFA;VENTOLIN HFA) 108 (90 Base) MCG/ACT inhaler Take 2 puffs in AM & PM every day for next 5 days, & also can use every 4-6 hours as needed. After 5 days, use only every 4-6 hrs as needed (Patient not taking: Reported on 12/13/2021) 1 Inhaler 0   ondansetron (ZOFRAN-ODT) 4 MG disintegrating tablet Take 1 tablet (4 mg total) by mouth every 8 (eight) hours as needed for nausea or vomiting. (Patient not taking: Reported on 12/13/2021) 20 tablet 0   Current Facility-Administered Medications  Medication Dose Route Frequency Provider Last Rate Last Admin   albuterol (PROVENTIL) (2.5 MG/3ML)  0.083% nebulizer solution 2.5 mg  2.5 mg Nebulization Once Guse, Jacquelynn Cree, FNP       betamethasone acetate-betamethasone sodium phosphate (CELESTONE) injection 3 mg  3 mg Intramuscular Once Edrick Kins, DPM        PHYSICAL EXAMINATION: ECOG PERFORMANCE STATUS: 0 - Asymptomatic  BP 132/72 (BP Location: Left Arm, Patient Position: Sitting, Cuff Size: Normal)   Pulse 69   Temp 98 F (36.7 C) (Tympanic)   Resp 16   Ht '5\' 9"'$  (1.753 m)   Wt 191 lb 3.2 oz (86.7 kg)   SpO2 100%   BMI 28.24 kg/m   Filed Weights   05/31/22 1438  Weight: 191 lb 3.2 oz (86.7 kg)    Physical Exam Vitals and nursing note reviewed.  HENT:     Head: Normocephalic and atraumatic.     Mouth/Throat:     Pharynx: Oropharynx is clear.  Eyes:     Extraocular Movements: Extraocular movements intact.     Pupils: Pupils are equal, round, and reactive to light.  Cardiovascular:     Rate and Rhythm: Normal rate and regular rhythm.  Pulmonary:     Comments: Decreased breath sounds bilaterally.  Abdominal:     Palpations: Abdomen is soft.  Musculoskeletal:        General: Normal range of motion.     Cervical back: Normal range of motion.  Skin:    General: Skin is warm.  Neurological:     General: No focal deficit present.     Mental Status: He is alert and oriented to person, place, and time.  Psychiatric:        Behavior: Behavior normal.        Judgment: Judgment normal.        LABORATORY DATA:  I have reviewed the data as listed    Component Value Date/Time   NA 136 05/31/2022 1418   K 3.8 05/31/2022 1418   CL 102 05/31/2022 1418   CO2 27 05/31/2022 1418   GLUCOSE 97 05/31/2022 1418   BUN 13 05/31/2022 1418   CREATININE 1.26 (H) 05/31/2022 1418   CALCIUM 9.5 05/31/2022 1418   PROT 6.5 05/31/2022 1418   ALBUMIN 3.8 05/31/2022 1418   AST 17 05/31/2022 1418   ALT 14 05/31/2022 1418   ALKPHOS 97 05/31/2022 1418   BILITOT 0.8 05/31/2022 1418   GFRNONAA 60 (L) 05/31/2022 1418   GFRAA >60  03/30/2020 0958    No results found for: "SPEP", "UPEP"  Lab Results  Component Value Date   WBC 5.3 05/31/2022   NEUTROABS 2.2 05/31/2022   HGB 15.7  05/31/2022   HCT 47.3 05/31/2022   MCV 75.0 (L) 05/31/2022   PLT 178 05/31/2022      Chemistry      Component Value Date/Time   NA 136 05/31/2022 1418   K 3.8 05/31/2022 1418   CL 102 05/31/2022 1418   CO2 27 05/31/2022 1418   BUN 13 05/31/2022 1418   CREATININE 1.26 (H) 05/31/2022 1418      Component Value Date/Time   CALCIUM 9.5 05/31/2022 1418   ALKPHOS 97 05/31/2022 1418   AST 17 05/31/2022 1418   ALT 14 05/31/2022 1418   BILITOT 0.8 05/31/2022 1418       RADIOGRAPHIC STUDIES: I have personally reviewed the radiological images as listed and agreed with the findings in the report. No results found.   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 Adjacent mildly enlarged noncalcified right mesenteric node, new.  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  Small focus of increased uptake within the ventral aspect of the right hemiabdomen localizing to a loop of small bowel.  NOV 2022 -EGD -duodenal submucosal lesions [not biopsied].  Endoscopic ultrasound December 2022-negative for malignancy.  #Given the low volume/asymtomatic disease [hx C. difficile infection/chronic dyspepsia-I think is reasonable to hold off any Sandostatin at this time. Continue surveillance. STABLE.   # Hemorrhoidal Bleeding: defer to GI.   # Smoking: Again recommended quitting smoking. S/p Low dose CT- APRIL 2023- WNL.   # DISPOSITION: # follow up in 4 months- MD; labs- cbc/cmp;Chromogranin-A- Dr.B        Orders Placed This Encounter  Procedures   CBC with Differential/Platelet    Standing  Status:   Future    Standing Expiration Date:   06/01/2023   Comprehensive metabolic panel    Standing Status:   Future    Standing Expiration Date:   05/31/2023   Chromogranin A    Standing Status:   Future    Standing Expiration Date:   05/31/2023     All questions were answered. The patient knows to call the clinic with any problems, questions or concerns.      Cammie Sickle, MD 06/07/2022 9:25 PM

## 2022-06-01 LAB — CHROMOGRANIN A: Chromogranin A (ng/mL): 88.7 ng/mL (ref 0.0–101.8)

## 2022-06-07 ENCOUNTER — Encounter: Payer: Self-pay | Admitting: Internal Medicine

## 2022-07-27 DIAGNOSIS — Z961 Presence of intraocular lens: Secondary | ICD-10-CM | POA: Diagnosis not present

## 2022-07-27 DIAGNOSIS — H35373 Puckering of macula, bilateral: Secondary | ICD-10-CM | POA: Diagnosis not present

## 2022-07-27 DIAGNOSIS — H353131 Nonexudative age-related macular degeneration, bilateral, early dry stage: Secondary | ICD-10-CM | POA: Diagnosis not present

## 2022-07-27 DIAGNOSIS — H04123 Dry eye syndrome of bilateral lacrimal glands: Secondary | ICD-10-CM | POA: Diagnosis not present

## 2022-07-27 DIAGNOSIS — H1045 Other chronic allergic conjunctivitis: Secondary | ICD-10-CM | POA: Diagnosis not present

## 2022-07-31 DIAGNOSIS — G4700 Insomnia, unspecified: Secondary | ICD-10-CM | POA: Diagnosis not present

## 2022-07-31 DIAGNOSIS — G4733 Obstructive sleep apnea (adult) (pediatric): Secondary | ICD-10-CM | POA: Diagnosis not present

## 2022-07-31 DIAGNOSIS — I10 Essential (primary) hypertension: Secondary | ICD-10-CM | POA: Diagnosis not present

## 2022-09-28 ENCOUNTER — Encounter: Payer: Self-pay | Admitting: Internal Medicine

## 2022-09-28 ENCOUNTER — Telehealth: Payer: Self-pay | Admitting: Acute Care

## 2022-09-28 NOTE — Telephone Encounter (Signed)
Appointment for LDCT cancelled and order cancelled.  Letter mailed to patient confirming the cancellation.

## 2022-09-29 ENCOUNTER — Encounter: Payer: Self-pay | Admitting: Internal Medicine

## 2022-09-29 ENCOUNTER — Inpatient Hospital Stay (HOSPITAL_BASED_OUTPATIENT_CLINIC_OR_DEPARTMENT_OTHER): Payer: Medicare HMO | Admitting: Internal Medicine

## 2022-09-29 ENCOUNTER — Inpatient Hospital Stay: Payer: Medicare HMO | Attending: Internal Medicine

## 2022-09-29 DIAGNOSIS — Z86010 Personal history of colonic polyps: Secondary | ICD-10-CM | POA: Insufficient documentation

## 2022-09-29 DIAGNOSIS — D3A098 Benign carcinoid tumors of other sites: Secondary | ICD-10-CM

## 2022-09-29 DIAGNOSIS — F17210 Nicotine dependence, cigarettes, uncomplicated: Secondary | ICD-10-CM | POA: Insufficient documentation

## 2022-09-29 DIAGNOSIS — C7A019 Malignant carcinoid tumor of the small intestine, unspecified portion: Secondary | ICD-10-CM | POA: Diagnosis not present

## 2022-09-29 DIAGNOSIS — Z8349 Family history of other endocrine, nutritional and metabolic diseases: Secondary | ICD-10-CM | POA: Insufficient documentation

## 2022-09-29 DIAGNOSIS — Z8379 Family history of other diseases of the digestive system: Secondary | ICD-10-CM | POA: Diagnosis not present

## 2022-09-29 DIAGNOSIS — Z9049 Acquired absence of other specified parts of digestive tract: Secondary | ICD-10-CM | POA: Diagnosis not present

## 2022-09-29 DIAGNOSIS — Z886 Allergy status to analgesic agent status: Secondary | ICD-10-CM | POA: Diagnosis not present

## 2022-09-29 DIAGNOSIS — Z882 Allergy status to sulfonamides status: Secondary | ICD-10-CM | POA: Insufficient documentation

## 2022-09-29 DIAGNOSIS — Z888 Allergy status to other drugs, medicaments and biological substances status: Secondary | ICD-10-CM | POA: Insufficient documentation

## 2022-09-29 DIAGNOSIS — Z79899 Other long term (current) drug therapy: Secondary | ICD-10-CM | POA: Insufficient documentation

## 2022-09-29 DIAGNOSIS — I700 Atherosclerosis of aorta: Secondary | ICD-10-CM | POA: Diagnosis not present

## 2022-09-29 DIAGNOSIS — Z8719 Personal history of other diseases of the digestive system: Secondary | ICD-10-CM | POA: Insufficient documentation

## 2022-09-29 DIAGNOSIS — N400 Enlarged prostate without lower urinary tract symptoms: Secondary | ICD-10-CM | POA: Insufficient documentation

## 2022-09-29 DIAGNOSIS — Z823 Family history of stroke: Secondary | ICD-10-CM | POA: Insufficient documentation

## 2022-09-29 DIAGNOSIS — Z8249 Family history of ischemic heart disease and other diseases of the circulatory system: Secondary | ICD-10-CM | POA: Insufficient documentation

## 2022-09-29 DIAGNOSIS — E7800 Pure hypercholesterolemia, unspecified: Secondary | ICD-10-CM | POA: Insufficient documentation

## 2022-09-29 DIAGNOSIS — I10 Essential (primary) hypertension: Secondary | ICD-10-CM | POA: Diagnosis not present

## 2022-09-29 DIAGNOSIS — I2510 Atherosclerotic heart disease of native coronary artery without angina pectoris: Secondary | ICD-10-CM | POA: Diagnosis not present

## 2022-09-29 DIAGNOSIS — Z833 Family history of diabetes mellitus: Secondary | ICD-10-CM | POA: Diagnosis not present

## 2022-09-29 LAB — CBC WITH DIFFERENTIAL/PLATELET
Abs Immature Granulocytes: 0.03 10*3/uL (ref 0.00–0.07)
Basophils Absolute: 0.1 10*3/uL (ref 0.0–0.1)
Basophils Relative: 1 %
Eosinophils Absolute: 0.3 10*3/uL (ref 0.0–0.5)
Eosinophils Relative: 4 %
HCT: 45.6 % (ref 39.0–52.0)
Hemoglobin: 15 g/dL (ref 13.0–17.0)
Immature Granulocytes: 0 %
Lymphocytes Relative: 38 %
Lymphs Abs: 2.6 10*3/uL (ref 0.7–4.0)
MCH: 24.9 pg — ABNORMAL LOW (ref 26.0–34.0)
MCHC: 32.9 g/dL (ref 30.0–36.0)
MCV: 75.7 fL — ABNORMAL LOW (ref 80.0–100.0)
Monocytes Absolute: 0.6 10*3/uL (ref 0.1–1.0)
Monocytes Relative: 8 %
Neutro Abs: 3.3 10*3/uL (ref 1.7–7.7)
Neutrophils Relative %: 49 %
Platelets: 213 10*3/uL (ref 150–400)
RBC: 6.02 MIL/uL — ABNORMAL HIGH (ref 4.22–5.81)
RDW: 17 % — ABNORMAL HIGH (ref 11.5–15.5)
WBC: 6.8 10*3/uL (ref 4.0–10.5)
nRBC: 0 % (ref 0.0–0.2)

## 2022-09-29 LAB — COMPREHENSIVE METABOLIC PANEL
ALT: 13 U/L (ref 0–44)
AST: 19 U/L (ref 15–41)
Albumin: 3.7 g/dL (ref 3.5–5.0)
Alkaline Phosphatase: 86 U/L (ref 38–126)
Anion gap: 7 (ref 5–15)
BUN: 10 mg/dL (ref 8–23)
CO2: 27 mmol/L (ref 22–32)
Calcium: 9.6 mg/dL (ref 8.9–10.3)
Chloride: 105 mmol/L (ref 98–111)
Creatinine, Ser: 1.1 mg/dL (ref 0.61–1.24)
GFR, Estimated: >60 mL/min (ref >60)
Glucose, Bld: 117 mg/dL — ABNORMAL HIGH (ref 70–99)
Potassium: 3.7 mmol/L (ref 3.5–5.1)
Sodium: 139 mmol/L (ref 135–145)
Total Bilirubin: 0.7 mg/dL (ref 0.3–1.2)
Total Protein: 6.3 g/dL — ABNORMAL LOW (ref 6.5–8.1)

## 2022-09-29 NOTE — Progress Notes (Signed)
[place] OFFICE PROGRESS NOTE  Patient Care Team: Ria Bush, MD as PCP - General [name], MD as Consulting Physician (Gastroenterology) Cammie Sickle, MD as Consulting Physician (Hematology and Oncology)   Cancer Staging  No matching staging information was found for the patient.   Oncology History   No history exists.    MPRESSION: 1. 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 Adjacent mildly enlarged noncalcified right mesenteric node, new. 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 DOTATATE PET-CT could be considered for further characterization. 2. No evidence of bowel obstruction or acute bowel inflammation. Mild scattered colonic diverticulosis, with no evidence of acute diverticulitis. 3. Mild prostatomegaly. 4. Coronary atherosclerosis. 5. Aortic Atherosclerosis (ICD10-I70   Gallium PET OCT 5th, 2022-Partially calcified mass within the small bowel mesentery is tracer avid compatible with neuroendocrine receptor avid tumor.  Small focus of increased uptake within the ventral aspect of the right hemiabdomen localizing to a loop of small bowel.  DIAGNOSIS:  A. AMPULLARY MASS; COLD BIOPSY:  - DUODENAL MUCOSA WITH BRUNNER GLAND PROLIFERATIVE LESION AND OVERLYING  REACTIVE EPITHELIAL CHANGES.  - SEE COMMENT.  - HISTOLOGIC FEATURES OF NEUROENDOCRINE TUMOR ARE NOT IDENTIFIED.  - NEGATIVE FOR DYSPLASIA AND MALIGNANCY.   Comment:  The clinical concern for a neuroendocrine tumor is noted.  Brunner gland proliferative lesions include Brunner gland hyperplasia,  hamartoma, and adenoma. Since there is histologic overlap between these  entities and local excision is curative, the suggested nomenclature is  to report these as ``Brunner gland proliferative lesions.  Dysplasia  and malignancy are not identified in the submitted material.     HISTORY OF PRESENT ILLNESS: Patient alone.   Ambulating independently.  Dominic Turner 76 y.o.  male pleasant patient above history of history of chronic GI/abdominal discomfort clinically suggestive of low-grade carcinoid/small bowel is here for follow-up/.  Patient states no concerns today. No diarrhea. No bloating. Patient is back to his baseline health.   Review of Systems  Constitutional:  Negative for chills, diaphoresis, fever, malaise/fatigue and weight loss.  HENT:  Negative for nosebleeds and sore throat.   Eyes:  Negative for double vision.  Respiratory:  Negative for cough, hemoptysis, sputum production, shortness of breath and wheezing.   Cardiovascular:  Negative for chest pain, palpitations, orthopnea and leg swelling.  Gastrointestinal:  Negative for blood in stool, diarrhea, melena and vomiting.  Genitourinary:  Negative for dysuria, frequency and urgency.  Musculoskeletal:  Negative for back pain and joint pain.  Skin: Negative.  Negative for itching and rash.  Neurological:  Negative for dizziness, tingling, focal weakness, weakness and headaches.  Endo/Heme/Allergies:  Does not bruise/bleed easily.  Psychiatric/Behavioral:  Negative for depression. The patient is not nervous/anxious and does not have insomnia.       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 Post bronchodilator test not improved.   Diverticulosis 02/2013   by colonoscopy   Essential hypertension, benign    History of adenomatous polyp of colon 02/2013   rec rpt 3 yrs   Overweight(278.02)    Positive PPD, treated 2014   s/p rifampin x69mo  Pure hypercholesterolemia    Tobacco use disorder    Unspecified  gastritis and gastroduodenitis without mention of hemorrhage    Unspecified sleep apnea     PAST SURGICAL HISTORY :   Past Surgical History:  Procedure Laterality Date   CATARACT EXTRACTION, BILATERAL Bilateral    CHOLECYSTECTOMY N/A 12/30/2015   Procedure: LAPAROSCOPIC CHOLECYSTECTOMY;  Surgeon: Florene Glen, MD;  Location: [facility] ORS;  Service: General;  Laterality: N/A;   COLONOSCOPY  03/03/2013   tubular adenoma x3, diverticulosis, rpt 3 yrs (Dr. Cristina Gong)   COLONOSCOPY  05/2016   TA x3, diverticulosis, single angiodysplastic lesion, rpt 3 yrs (Buccini)   COLONOSCOPY  09/2019   TA, HP, rpt 5 yrs (Buccini)   COLONOSCOPY WITH PROPOFOL N/A 10/12/2021   multiple TA, rpt 3 yrs Vicente Males, Bailey Mech, MD)   ESOPHAGOGASTRODUODENOSCOPY N/A 10/12/2021   chronic gastritis with + H pylori (treated with clarithromycin, amoxicillin, omeprazole x2wks), intestinal metaplasia present rpt 6 months Vicente Males, Bailey Mech, MD)   EUS N/A 11/10/2021   UPPER ENDOSCOPIC ULTRASOUND (EUS) LINEAR - nondiagnostic;  (Spaete, Lenetta Quaker, MD)    FAMILY HISTORY :   Family History  Problem Relation Age of Onset   Cirrhosis Father        died in 72's   Hypertension Mother    Heart attack Sister    Stroke Sister    Heart attack Maternal Grandmother    Stroke Maternal Grandmother    Thyroid disease Sister    Diabetes Sister    Cancer Neg Hx     SOCIAL HISTORY:   Social History   Tobacco Use   Smoking status: Every Day    Packs/day: 0.75    Years: 56.00    Total pack years: 42.00    Types: Cigarettes   Smokeless tobacco: Never  Vaping Use   Vaping Use: Never used  Substance Use Topics   Alcohol use: No    Alcohol/week: 0.0 standard drinks of alcohol   Drug use: No    ALLERGIES:  is allergic to aspirin, sertraline hcl, sulfonamide derivatives, and wellbutrin [bupropion].  MEDICATIONS:  Current Outpatient Medications  Medication Sig Dispense Refill   albuterol (PROVENTIL HFA;VENTOLIN HFA) 108 (90 Base)  MCG/ACT inhaler Take 2 puffs in AM & PM every day for next 5 days, & also can use every 4-6 hours as needed. After 5 days, use only every 4-6 hrs as needed 1 Inhaler 0   amLODipine (NORVASC) 10 MG tablet Take 1 tablet (10 mg total) by mouth daily. 90 tablet 3   clobetasol (TEMOVATE) 0.05 % external solution Apply topically.     Docusate Calcium (STOOL SOFTENER PO) Take 1 tablet by mouth daily.     fluticasone (CUTIVATE) 0.05 % cream Apply topically. Apply to affected areas on face up to twice a day as needed     fluticasone (FLONASE) 50 MCG/ACT nasal spray USE 2 SPRAYS IN BOTH  NOSTRILS DAILY 48 g 3   loratadine (CLARITIN) 10 MG tablet Take 1 tablet (10 mg total) by mouth daily.     Multiple Vitamin Essential TABS Take 1 tablet by mouth daily.     omeprazole (PRILOSEC) 20 MG capsule Take 1 capsule (20 mg total) by mouth 2 (two) times daily before a meal for 14 days. 28 capsule 0   polyethylene glycol powder (GLYCOLAX/MIRALAX) 17 GM/SCOOP powder Take 17 g by mouth daily. 3350 g 1   potassium chloride SA (KLOR-CON M) 20 MEQ tablet Take 1 tablet (20 mEq total) by mouth daily.  30 tablet 0   pravastatin (PRAVACHOL) 40 MG tablet Take 1 tablet (40 mg total) by mouth daily. 90 tablet 3   tamsulosin (FLOMAX) 0.4 MG CAPS capsule Take 1 capsule (0.4 mg total) by mouth daily. 90 capsule 3   tiotropium (SPIRIVA HANDIHALER) 18 MCG inhalation capsule INHALE THE CONTENTS OF 1  CAPSULE BY MOUTH VIA  HANDIHALER DAILY AT BEDTIME 90 capsule 3   traZODone (DESYREL) 50 MG tablet TAKE ONE AND ONE-HALF TABLETS BY MOUTH AT BEDTIME AS NEEDED     ondansetron (ZOFRAN-ODT) 4 MG disintegrating tablet Take 1 tablet (4 mg total) by mouth every 8 (eight) hours as needed for nausea or vomiting. (Patient not taking: Reported on 12/13/2021) 20 tablet 0   Current Facility-Administered Medications  Medication Dose Route Frequency Provider Last Rate Last Admin   albuterol (PROVENTIL) (2.5 MG/3ML) 0.083% nebulizer solution 2.5 mg  2.5 mg  Nebulization Once Guse, Jacquelynn Cree, FNP       betamethasone acetate-betamethasone sodium phosphate (CELESTONE) injection 3 mg  3 mg Intramuscular Once Edrick Kins, DPM        PHYSICAL EXAMINATION: ECOG PERFORMANCE STATUS: 0 - Asymptomatic  BP 131/66   Pulse 85   Temp 98.7 F (37.1 C)   Resp 19   Wt 193 lb 12.8 oz (87.9 kg)   SpO2 99%   BMI 28.62 kg/m   Filed Weights   09/29/22 1458  Weight: 193 lb 12.8 oz (87.9 kg)    Physical Exam Vitals and nursing note reviewed.  HENT:     Head: Normocephalic and atraumatic.     Mouth/Throat:     Pharynx: Oropharynx is clear.  Eyes:     Extraocular Movements: Extraocular movements intact.     Pupils: Pupils are equal, round, and reactive to light.  Cardiovascular:     Rate and Rhythm: Normal rate and regular rhythm.  Pulmonary:     Comments: Decreased breath sounds bilaterally.  Abdominal:     Palpations: Abdomen is soft.  Musculoskeletal:        General: Normal range of motion.     Cervical back: Normal range of motion.  Skin:    General: Skin is warm.  Neurological:     General: No focal deficit present.     Mental Status: He is alert and oriented to person, place, and time.  Psychiatric:        Behavior: Behavior normal.        Judgment: Judgment normal.        LABORATORY DATA:  I have reviewed the data as listed    Component Value Date/Time   NA 139 09/29/2022 1446   K 3.7 09/29/2022 1446   CL 105 09/29/2022 1446   CO2 27 09/29/2022 1446   GLUCOSE 117 (H) 09/29/2022 1446   BUN 10 09/29/2022 1446   CREATININE 1.10 09/29/2022 1446   CALCIUM 9.6 09/29/2022 1446   PROT 6.3 (L) 09/29/2022 1446   ALBUMIN 3.7 09/29/2022 1446   AST 19 09/29/2022 1446   ALT 13 09/29/2022 1446   ALKPHOS 86 09/29/2022 1446   BILITOT 0.7 09/29/2022 1446   GFRNONAA >60 09/29/2022 1446   GFRAA >60 03/30/2020 0958    No results found for: "SPEP", "UPEP"  Lab Results  Component Value Date   WBC 6.8 09/29/2022   NEUTROABS 3.3  09/29/2022   HGB 15.0 09/29/2022   HCT 45.6 09/29/2022   MCV 75.7 (L) 09/29/2022   PLT 213 09/29/2022      Chemistry  Component Value Date/Time   NA 139 09/29/2022 1446   K 3.7 09/29/2022 1446   CL 105 09/29/2022 1446   CO2 27 09/29/2022 1446   BUN 10 09/29/2022 1446   CREATININE 1.10 09/29/2022 1446      Component Value Date/Time   CALCIUM 9.6 09/29/2022 1446   ALKPHOS 86 09/29/2022 1446   AST 19 09/29/2022 1446   ALT 13 09/29/2022 1446   BILITOT 0.7 09/29/2022 1446       RADIOGRAPHIC STUDIES: I have personally reviewed the radiological images as listed and agreed with the findings in the report. No results found.   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 Adjacent mildly enlarged noncalcified right mesenteric node, new.  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  Small focus of increased uptake within the ventral aspect of the right hemiabdomen localizing to a loop of small bowel.  NOV 2022 -EGD -duodenal submucosal lesions [not biopsied].  Endoscopic ultrasound December 2022-negative for malignancy.  #Given the low volume/asymtomatic disease [hx C. difficile infection/chronic dyspepsia-I think is reasonable to hold off any Sandostatin at this time. Continue surveillance. STABLE.   # Smoking: Again recommended quitting smoking- but difficulty with quitting. S/p Low dose CT- APRIL 2023- WNL; STABLE.   # DISPOSITION: # follow up in 4 months- MD; labs- cbc/cmp;Chromogranin-A- Dr.B         No orders of the defined types were placed in this encounter.    All questions were answered. The patient knows to call the clinic with any problems, questions or concerns.      Cammie Sickle, MD 09/29/2022 3:36  PM

## 2022-09-29 NOTE — Assessment & Plan Note (Addendum)
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 Adjacent mildly enlarged noncalcified right mesenteric node, new.  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  Small focus of increased uptake within the ventral aspect of the right hemiabdomen localizing to a loop of small bowel.  NOV 2022 -EGD -duodenal submucosal lesions [not biopsied].  Endoscopic ultrasound December 2022-negative for malignancy.  #Given the low volume/asymtomatic disease [hx C. difficile infection/chronic dyspepsia-I think is reasonable to hold off any Sandostatin at this time. Continue surveillance. STABLE.   # Smoking: Again recommended quitting smoking- but difficulty with quitting. S/p Low dose CT- APRIL 2023- WNL; STABLE.   # DISPOSITION: # follow up in 4 months- MD; labs- cbc/cmp;Chromogranin-A- Dr.B

## 2022-10-02 ENCOUNTER — Ambulatory Visit: Payer: Medicare HMO

## 2022-10-03 LAB — CHROMOGRANIN A: Chromogranin A (ng/mL): 90.9 ng/mL (ref 0.0–101.8)

## 2022-10-11 ENCOUNTER — Ambulatory Visit: Payer: Medicare HMO | Admitting: Podiatry

## 2022-10-11 ENCOUNTER — Encounter: Payer: Self-pay | Admitting: Podiatry

## 2022-10-11 DIAGNOSIS — L84 Corns and callosities: Secondary | ICD-10-CM | POA: Diagnosis not present

## 2022-10-11 DIAGNOSIS — M2041 Other hammer toe(s) (acquired), right foot: Secondary | ICD-10-CM | POA: Insufficient documentation

## 2022-10-11 NOTE — Progress Notes (Signed)
This patient presents to the office with chief complaint of corn on the outside of his fifth toe.  He was previously seen by Dr.  Amalia Hailey who treated her corn with debridement.  He says the ocrn is painful walking and wearing her shoes.  He presents for evaluation and treatment.  Vascular  Dorsalis pedis and posterior tibial pulses are palpable  B/L.  Capillary return  WNL.  Temperature gradient is  WNL.  Skin turgor  WNL  Sensorium  Senn Weinstein monofilament wire  WNL. Normal tactile sensation.  Nail Exam  Patient has normal nails with no evidence of bacterial or fungal infection.  Orthopedic  Exam  Muscle tone and muscle strength  WNL.  No limitations of motion feet  B/L.  No crepitus or joint effusion noted.  Foot type is unremarkable and digits show no abnormalities.  Bony prominences are unremarkable.  Skin  No open lesions.  Normal skin texture and turgor.  Listers corn fifth toe right foot.  Listers corn 5th toe right foot.  Debride corn with # 15 blade.  Padding dispensed.  RTC  prn.   Gardiner Barefoot DPM

## 2023-01-08 ENCOUNTER — Ambulatory Visit: Payer: Medicare HMO | Admitting: Podiatry

## 2023-01-08 DIAGNOSIS — M2041 Other hammer toe(s) (acquired), right foot: Secondary | ICD-10-CM

## 2023-01-08 DIAGNOSIS — L84 Corns and callosities: Secondary | ICD-10-CM

## 2023-01-08 NOTE — Patient Instructions (Signed)
Look for salicylic acid 16-60% corn/callus remover liquid or cream or pads. Use them at night. Wear the silicone pads when in shoes walking.  More silicone pads can be purchased from:  [email or web address]

## 2023-01-08 NOTE — Progress Notes (Signed)
  Subjective:  Patient ID: Daron Stutz, male    DOB: 08/17/1946,  MRN: [id number]  Chief Complaint  Patient presents with   Callouses    3 month follow up, Right foot corns and callous near pinky toe, painful    77 y.o. male presents with the above complaint. History confirmed with patient. Last visit was quite painful. He is not using any padding or creams currently for this  Objective:  Physical Exam: warm, good capillary refill, no trophic changes or ulcerative lesions, normal DP and PT pulses, normal sensory exam, and adductovarus fifth toe and painful corn / lister's lesion lateral fifth  Assessment:   1. Corns and callosities   2. Hammer toe of right foot      Plan:  Patient was evaluated and treated and all questions answered.  I debrided the lesion as a courtesy today. Discussed with him that I would recommend utilizing salicylic acid cream/ointment until the lesion resolves. Salinocaine ointment applied today. Silicone offloading pads dispensed. If worsens or does not resolve can take xrays and consider surgical correction   Return if symptoms worsen or fail to improve.

## 2023-01-30 ENCOUNTER — Ambulatory Visit: Payer: Medicare HMO | Admitting: Internal Medicine

## 2023-01-30 ENCOUNTER — Other Ambulatory Visit: Payer: Medicare HMO

## 2023-02-02 ENCOUNTER — Inpatient Hospital Stay: Payer: Medicare HMO | Admitting: Internal Medicine

## 2023-02-02 ENCOUNTER — Inpatient Hospital Stay: Payer: Medicare HMO

## 2023-02-26 ENCOUNTER — Telehealth: Payer: Self-pay | Admitting: *Deleted
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
## 2023-04-25 DIAGNOSIS — H52209 Unspecified astigmatism, unspecified eye: Secondary | ICD-10-CM | POA: Diagnosis not present

## 2023-04-25 DIAGNOSIS — H524 Presbyopia: Secondary | ICD-10-CM | POA: Diagnosis not present

## 2023-04-25 DIAGNOSIS — H5213 Myopia, bilateral: Secondary | ICD-10-CM | POA: Diagnosis not present

## 2023-08-06 DIAGNOSIS — H35373 Puckering of macula, bilateral: Secondary | ICD-10-CM | POA: Diagnosis not present

## 2023-08-06 DIAGNOSIS — H353131 Nonexudative age-related macular degeneration, bilateral, early dry stage: Secondary | ICD-10-CM | POA: Diagnosis not present

## 2023-08-06 DIAGNOSIS — H1045 Other chronic allergic conjunctivitis: Secondary | ICD-10-CM | POA: Diagnosis not present

## 2023-08-06 DIAGNOSIS — Z961 Presence of intraocular lens: Secondary | ICD-10-CM | POA: Diagnosis not present

## 2023-08-06 DIAGNOSIS — H04123 Dry eye syndrome of bilateral lacrimal glands: Secondary | ICD-10-CM | POA: Diagnosis not present

## 2023-09-11 ENCOUNTER — Emergency Department
Admission: EM | Admit: 2023-09-11 | Discharge: 2023-09-11 | Disposition: A | Discharge status: Home / Self Care | Payer: No Typology Code available for payment source | Attending: Emergency Medicine | Admitting: Emergency Medicine

## 2023-09-11 ENCOUNTER — Encounter: Payer: Self-pay | Admitting: Physician Assistant

## 2023-09-11 ENCOUNTER — Emergency Department: Payer: No Typology Code available for payment source

## 2023-09-11 ENCOUNTER — Other Ambulatory Visit: Payer: Self-pay

## 2023-09-11 DIAGNOSIS — J449 Chronic obstructive pulmonary disease, unspecified: Secondary | ICD-10-CM | POA: Diagnosis not present

## 2023-09-11 DIAGNOSIS — M4802 Spinal stenosis, cervical region: Secondary | ICD-10-CM | POA: Diagnosis not present

## 2023-09-11 DIAGNOSIS — Y92009 Unspecified place in unspecified non-institutional (private) residence as the place of occurrence of the external cause: Secondary | ICD-10-CM

## 2023-09-11 DIAGNOSIS — S0101XA Laceration without foreign body of scalp, initial encounter: Secondary | ICD-10-CM | POA: Insufficient documentation

## 2023-09-11 DIAGNOSIS — W01198A Fall on same level from slipping, tripping and stumbling with subsequent striking against other object, initial encounter: Secondary | ICD-10-CM | POA: Diagnosis not present

## 2023-09-11 DIAGNOSIS — I10 Essential (primary) hypertension: Secondary | ICD-10-CM | POA: Insufficient documentation

## 2023-09-11 DIAGNOSIS — M5030 Other cervical disc degeneration, unspecified cervical region: Secondary | ICD-10-CM | POA: Diagnosis not present

## 2023-09-11 DIAGNOSIS — S0990XA Unspecified injury of head, initial encounter: Secondary | ICD-10-CM

## 2023-09-11 DIAGNOSIS — Z043 Encounter for examination and observation following other accident: Secondary | ICD-10-CM | POA: Diagnosis not present

## 2023-09-11 MED ORDER — ACETAMINOPHEN 325 MG PO TABS
650.0000 mg | ORAL_TABLET | Freq: Once | ORAL | Status: AC
  Administered 2023-09-11: 650 mg via ORAL
  Filled 2023-09-11: qty 2

## 2023-09-11 MED ORDER — LIDOCAINE HCL (PF) 1 % IJ SOLN
10.0000 mL | Freq: Once | INTRAMUSCULAR | Status: AC
  Administered 2023-09-11: 10 mL
  Filled 2023-09-11: qty 10

## 2023-09-11 NOTE — ED Notes (Signed)
3-4 inch laceration to back of head.  DSD applied.  Ice pack given.  Patient is AAOx3.  Skin warm and dry. NAD

## 2023-09-11 NOTE — ED Notes (Signed)
Pt verbalizes understanding of discharge instructions. Opportunity for questioning and answers were provided. Pt discharged from ED to home by self.    

## 2023-09-11 NOTE — ED Triage Notes (Signed)
Pt comes with c/o fall. Pt states he fell striking his head. Pt has laceration to back of head. Pt did hit his head. Pt has bandage in place. Pt not on thinners.

## 2023-09-11 NOTE — Discharge Instructions (Addendum)
Your exam and CT scans are normal reassuring at this time but no signs of a serious head injury laded to your fall.  You may go to a local urgent care for staple removal in 7 days.  Take OTC Tylenol as needed.

## 2023-09-11 NOTE — ED Provider Notes (Signed)
[facility] Emergency Department Provider Note     Event Date/Time   First MD Initiated Contact with Patient 09/11/23 1629     (approximate)   History   Fall   HPI  Dominic Turner is a 77 y.o. male with a history of COPD, HTN, and OSA, presents to the ED for evaluation of injury sustained following a mechanical fall.  Patient reports he apparently had a large wasp on his arm, when he swung to fling the wasp off of his arm.  He apparently lost balance, falling backwards, hitting the back of his head.  He presents with a large laceration to the back of head with bleeding currently controlled.  Patient believes he may have lost conscious because he has some loss of timing following the incident.  Noted only waking up [place] up on the ground.  He denies any history of anaphylaxis to bee or wasp stings.  Denies any difficulty breathing, swallowing, controlling oral secretions.  He denies any current chest pain, nausea, vomiting, or other complaints at this time.  Patient presents himself to the ED for evaluation following injury.  Physical Exam   Triage Vital Signs: ED Triage Vitals  Encounter Vitals Group     BP 09/11/23 1532 (!) 144/67     Systolic BP Percentile --      Diastolic BP Percentile --      Pulse Rate 09/11/23 1532 94     Resp 09/11/23 1532 16     Temp 09/11/23 1532 97.6 F (36.4 C)     Temp Source 09/11/23 1532 Oral     SpO2 09/11/23 1532 100 %     Weight --      Height --      Head Circumference --      Peak Flow --      Pain Score 09/11/23 1531 6     Pain Loc --      Pain Education --      Exclude from Growth Chart --     Most recent vital signs: Vitals:   09/11/23 1532 09/11/23 1901  BP: (!) 144/67 (!) 141/75  Pulse: 94 97  Resp: 16 18  Temp: 97.6 F (36.4 C)   SpO2: 100% 97%    General Awake, no distress. NAD HEENT NCAT, except for a large linear laceration to the posterior occiput of the head.  The laceration measures  approximately 8 cm and extends to the galea.  No active bleeding noted at this time.  PERRL. EOMI. No rhinorrhea. Mucous membranes are moist.  No angioedema appreciated CV:  Good peripheral perfusion.  RESP:  Normal effort.  ABD:  No distention.  NEURO: Finger nerves II to XII grossly intact.   ED Results / Procedures / Treatments   Labs (all labs ordered are listed, but only abnormal results are displayed) Labs Reviewed - No data to display   EKG   RADIOLOGY  I personally viewed and evaluated these images as part of my medical decision making, as well as reviewing the written report by the radiologist.  ED Provider Interpretation: No acute findings  CT Cervical Spine Wo Contrast  Result Date: 09/11/2023 CLINICAL DATA:  Fall, hit head EXAM: CT CERVICAL SPINE WITHOUT CONTRAST TECHNIQUE: Multidetector CT imaging of the cervical spine was performed without intravenous contrast. Multiplanar CT image reconstructions were also generated. RADIATION DOSE REDUCTION: This exam was performed according to the departmental dose-optimization program which includes automated exposure control, adjustment of the mA  and/or kV according to patient size and/or use of iterative reconstruction technique. COMPARISON:  None Available. FINDINGS: Alignment: No subluxation Skull base and vertebrae: No fracture. Sclerotic area in the C6 vertebral body, nonspecific. Soft tissues and spinal canal: Choose 1 Disc levels: Diffuse degenerative disc disease with disc space narrowing and spurring diffusely. No visible disc herniation. Upper chest: No acute findings Other: None IMPRESSION: No acute bony abnormality. Sclerotic focus within the C6 vertebral body, nonspecific. This could reflect bone island. This could be further evaluated with nuclear medicine whole-body bone scan or MRI if felt clinically indicated. Electronically Signed   By: Charlett Nose M.D.   On: 09/11/2023 18:28   CT HEAD WO CONTRAST ( )  Result Date:  09/11/2023 CLINICAL DATA:  Fall, hit head.  Laceration to back of head. EXAM: CT HEAD WITHOUT CONTRAST TECHNIQUE: Contiguous axial images were obtained from the base of the skull through the vertex without intravenous contrast. RADIATION DOSE REDUCTION: This exam was performed according to the departmental dose-optimization program which includes automated exposure control, adjustment of the mA and/or kV according to patient size and/or use of iterative reconstruction technique. COMPARISON:  01/20/2016 FINDINGS: Brain: Chronic appearing right occipital infarct. Mild age related volume loss. No acute intracranial abnormality. Specifically, no hemorrhage, hydrocephalus, mass lesion, acute infarction, or significant intracranial injury. Vascular: No hyperdense vessel or unexpected calcification. Skull: No acute calvarial abnormality. Sinuses/Orbits: No acute findings Other: Left posterior scalp laceration. IMPRESSION: Old right occipital infarct. No acute intracranial abnormality. Electronically Signed   By: Charlett Nose M.D.   On: 09/11/2023 18:26     PROCEDURES:  Critical Care performed: No  ..Laceration Repair  Date/Time: 09/11/2023 5:21 PM  Performed by: Lissa Hoard, PA-C Authorized by: Lissa Hoard, PA-C   Consent:    Consent obtained:  Verbal   Consent given by:  Patient   Risks, benefits, and alternatives were discussed: yes     Risks discussed:  Pain   Alternatives discussed:  No treatment Universal protocol:    Test results available: yes     Imaging studies available: yes     Site/side marked: yes     Patient identity confirmed:  Verbally with patient Anesthesia:    Anesthesia method:  Local infiltration   Local anesthetic:  Lidocaine 1% w/o epi Laceration details:    Location:  Scalp   Scalp location:  Crown   Length (cm):  8   Depth (mm):  5 Pre-procedure details:    Preparation:  Patient was prepped and draped in usual sterile fashion Exploration:     Limited defect created (wound extended): no     Hemostasis achieved with:  Direct pressure   Contaminated: no   Treatment:    Area cleansed with:  Saline and povidone-iodine   Irrigation solution:  Sterile saline   Irrigation volume:  10   Irrigation method:  Syringe   Debridement:  None   Undermining:  None   Scar revision: no   Skin repair:    Repair method:  Staples   Number of staples:  8 Approximation:    Approximation:  Close Repair type:    Repair type:  Simple Post-procedure details:    Dressing:  Non-adherent dressing and tube gauze   Procedure completion:  Tolerated well, no immediate complications    MEDICATIONS ORDERED IN ED: Medications  acetaminophen (TYLENOL) tablet 650 mg (650 mg Oral Given 09/11/23 1659)  lidocaine (PF) (XYLOCAINE) 1 % injection 10 mL (10 mLs Infiltration Given  09/11/23 1702)     IMPRESSION / MDM / ASSESSMENT AND PLAN / ED COURSE  I reviewed the triage vital signs and the nursing notes.                              Differential diagnosis includes, but is not limited to, SDH, skull fracture, scalp laceration, cervical fracture, cervical dislocation, cervical radiculopathy  Patient's presentation is most consistent with acute complicated illness / injury requiring diagnostic workup.  Patient's diagnosis is consistent with mechanical fall resulting in minor head injury and large scalp laceration.  Patient with reassuring exam and workup at this time.  No signs of any acute neuromuscular deficit, anaphylaxis, or angioedema.  Vital signs remained stable at his course in the ED.  He consents to wound repair which is performed using staples with good wound edge approximation.  No CT evidence of any acute intracranial process or cervical fracture based on my interpretation of images.  Patient will be discharged home with directions to take OTC Tylenol as needed. Patient is to follow up with his PCP for staple removal in 7 to 10 days, as discussed or  otherwise directed. Patient is given ED precautions to return to the ED for any worsening or new symptoms.   FINAL CLINICAL IMPRESSION(S) / ED DIAGNOSES   Final diagnoses:  Fall in home, initial encounter  Minor head injury, initial encounter  Laceration of scalp, initial encounter     Rx / DC Orders   ED Discharge Orders     None        Note:  This document was prepared using Dragon voice recognition software and may include unintentional dictation errors.    Lissa Hoard, PA-C 09/12/23 Jorje Guild    Shaune Pollack, MD 09/13/23 [phone]

## 2023-10-09 ENCOUNTER — Encounter: Payer: Self-pay | Admitting: Internal Medicine

## 2023-10-25 ENCOUNTER — Encounter: Payer: Self-pay | Admitting: Internal Medicine

## 2024-05-04 ENCOUNTER — Emergency Department

## 2024-05-04 ENCOUNTER — Encounter: Payer: Self-pay | Admitting: Internal Medicine

## 2024-05-04 ENCOUNTER — Other Ambulatory Visit: Payer: Self-pay

## 2024-05-04 ENCOUNTER — Emergency Department
Admission: EM | Admit: 2024-05-04 | Discharge: 2024-05-04 | Disposition: A | Discharge status: Home / Self Care | Attending: Emergency Medicine | Admitting: Emergency Medicine

## 2024-05-04 DIAGNOSIS — M5416 Radiculopathy, lumbar region: Secondary | ICD-10-CM | POA: Insufficient documentation

## 2024-05-04 DIAGNOSIS — I10 Essential (primary) hypertension: Secondary | ICD-10-CM | POA: Insufficient documentation

## 2024-05-04 DIAGNOSIS — R202 Paresthesia of skin: Secondary | ICD-10-CM | POA: Diagnosis present

## 2024-05-04 LAB — BASIC METABOLIC PANEL WITH GFR
Anion gap: 2 — ABNORMAL LOW (ref 5–15)
BUN: 7 mg/dL — ABNORMAL LOW (ref 8–23)
CO2: 32 mmol/L (ref 22–32)
Calcium: 9.7 mg/dL (ref 8.9–10.3)
Chloride: 107 mmol/L (ref 98–111)
Creatinine, Ser: 1.12 mg/dL (ref 0.61–1.24)
GFR, Estimated: >60 mL/min (ref >60)
Glucose, Bld: 97 mg/dL (ref 70–99)
Potassium: 4.4 mmol/L (ref 3.5–5.1)
Sodium: 141 mmol/L (ref 135–145)

## 2024-05-04 LAB — CBC WITH DIFFERENTIAL/PLATELET
Abs Immature Granulocytes: 0.03 10*3/uL (ref 0.00–0.07)
Basophils Absolute: 0.1 10*3/uL (ref 0.0–0.1)
Basophils Relative: 2 %
Eosinophils Absolute: 0.3 10*3/uL (ref 0.0–0.5)
Eosinophils Relative: 6 %
HCT: 46.9 % (ref 39.0–52.0)
Hemoglobin: 15 g/dL (ref 13.0–17.0)
Immature Granulocytes: 1 %
Lymphocytes Relative: 40 %
Lymphs Abs: 1.9 10*3/uL (ref 0.7–4.0)
MCH: 24.5 pg — ABNORMAL LOW (ref 26.0–34.0)
MCHC: 32 g/dL (ref 30.0–36.0)
MCV: 76.6 fL — ABNORMAL LOW (ref 80.0–100.0)
Monocytes Absolute: 0.5 10*3/uL (ref 0.1–1.0)
Monocytes Relative: 10 %
Neutro Abs: 2 10*3/uL (ref 1.7–7.7)
Neutrophils Relative %: 41 %
Platelets: 156 10*3/uL (ref 150–400)
RBC: 6.12 MIL/uL — ABNORMAL HIGH (ref 4.22–5.81)
RDW: 17 % — ABNORMAL HIGH (ref 11.5–15.5)
WBC: 4.8 10*3/uL (ref 4.0–10.5)
nRBC: 0 % (ref 0.0–0.2)

## 2024-05-04 NOTE — ED Triage Notes (Signed)
 Pt to ED for bilateral legs cold and tingling on and off since 2 months. Also L hand feels hot sometimes. Pt is ambulatory. Denies DM.

## 2024-05-04 NOTE — ED Provider Notes (Signed)
 [facility] Provider Note    Event Date/Time   First MD Initiated Contact with Patient 05/04/24 1226     (approximate)   History   bilateral tingling   HPI  Dominic Turner is a 78 y.o. male history of hypertension, lupus, among other chronic health problems see medical history presents emergency department complaining of sensation of the legs feeling cold and tingling on and off.  States worse when he is in air conditioning.  Also some burning and tingling in the left hand along the palm towards the small finger.  No known injury.  No chest pain shortness of breath.  No claudication type pain.      Physical Exam   Triage Vital Signs: ED Triage Vitals  Encounter Vitals Group     BP 05/04/24 1218 139/69     Systolic BP Percentile --      Diastolic BP Percentile --      Pulse Rate 05/04/24 1218 84     Resp 05/04/24 1218 20     Temp 05/04/24 1218 97.6 F (36.4 C)     Temp Source 05/04/24 1218 Oral     SpO2 05/04/24 1218 100 %     Weight 05/04/24 1217 188 lb (85.3 kg)     Height 05/04/24 1217 5\' 9"  (1.753 m)     Head Circumference --      Peak Flow --      Pain Score 05/04/24 1217 0     Pain Loc --      Pain Education --      Exclude from Growth Chart --     Most recent vital signs: Vitals:   05/04/24 1218  BP: 139/69  Pulse: 84  Resp: 20  Temp: 97.6 F (36.4 C)  SpO2: 100%     General: Awake, no distress.   CV:  Good peripheral perfusion.  Resp:  Normal effort.  Abd:  No distention.   Other:  Lumbar spine mildly tender, lower extremities with 5/5 strength, good skin turgor, no vascular discoloration, calves are nontender, neurovascular intact   ED Results / Procedures / Treatments   Labs (all labs ordered are listed, but only abnormal results are displayed) Labs Reviewed  BASIC METABOLIC PANEL WITH GFR - Abnormal; Notable for the following components:      Result Value   BUN 7 (*)    Anion gap 2 (*)    All other  components within normal limits  CBC WITH DIFFERENTIAL/PLATELET - Abnormal; Notable for the following components:   RBC 6.12 (*)    MCV 76.6 (*)    MCH 24.5 (*)    RDW 17.0 (*)    All other components within normal limits     EKG     RADIOLOGY X-ray lumbar spine    PROCEDURES:   Procedures  Critical Care:  no Chief Complaint  Patient presents with   bilateral tingling      MEDICATIONS ORDERED IN ED: Medications - No data to display   IMPRESSION / MDM / ASSESSMENT AND PLAN / ED COURSE  I reviewed the triage vital signs and the nursing notes.                              Differential diagnosis includes, but is not limited to, lumbar radiculopathy, B12 deficiency, thalassemia, bulging disc,  Patient's presentation is most consistent with exacerbation of chronic illness.   Cardiac monitor no  Medications given none  X-ray lumbar spine Basic labs ordered  Labs reassuring  X-ray lumbar spine independently reviewed interpreted by me as being negative for acute abnormality, does show degenerative changes worse at L4-L5 and S1  I did explain these findings to patient.  He was given reassurance.  Follow-up with the [place].  He is in agreement treatment plan.  Discharged stable condition.        FINAL CLINICAL IMPRESSION(S) / ED DIAGNOSES   Final diagnoses:  Lumbar radiculopathy     Rx / DC Orders   ED Discharge Orders     None        Note:  This document was prepared using Dragon voice recognition software and may include unintentional dictation errors.    Delsie Figures, PA-C 05/04/24 1600    Lubertha Rush, MD 05/04/24 [phone]

## 2024-05-04 NOTE — Discharge Instructions (Signed)
 Follow-up with your doctor at the [place].  Return if worsening

## 2024-06-05 ENCOUNTER — Other Ambulatory Visit (HOSPITAL_COMMUNITY): Payer: Self-pay

## 2024-06-05 DIAGNOSIS — C7A8 Other malignant neuroendocrine tumors: Secondary | ICD-10-CM

## 2024-06-11 ENCOUNTER — Other Ambulatory Visit: Payer: Self-pay

## 2024-06-11 DIAGNOSIS — D3A8 Other benign neuroendocrine tumors: Secondary | ICD-10-CM

## 2024-06-11 DIAGNOSIS — C7A8 Other malignant neuroendocrine tumors: Secondary | ICD-10-CM

## 2024-06-19 ENCOUNTER — Ambulatory Visit: Admission: RE | Admit: 2024-06-19 | Source: Ambulatory Visit

## 2024-06-19 ENCOUNTER — Other Ambulatory Visit: Payer: Self-pay

## 2024-06-19 DIAGNOSIS — R109 Unspecified abdominal pain: Secondary | ICD-10-CM

## 2024-06-19 DIAGNOSIS — C7A8 Other malignant neuroendocrine tumors: Secondary | ICD-10-CM

## 2024-06-25 ENCOUNTER — Ambulatory Visit
Admission: RE | Admit: 2024-06-25 | Discharge: 2024-06-25 | Disposition: A | Discharge status: Home / Self Care | Source: Ambulatory Visit

## 2024-06-25 DIAGNOSIS — C7A8 Other malignant neuroendocrine tumors: Secondary | ICD-10-CM | POA: Diagnosis not present

## 2024-06-25 DIAGNOSIS — I700 Atherosclerosis of aorta: Secondary | ICD-10-CM | POA: Diagnosis not present

## 2024-06-25 DIAGNOSIS — R109 Unspecified abdominal pain: Secondary | ICD-10-CM | POA: Diagnosis present

## 2024-06-25 MED ORDER — COPPER CU 64 DOTATATE 1 MCI/ML IV SOLN
4.0000 | Freq: Once | INTRAVENOUS | Status: AC
  Administered 2024-06-25: 4.1 via INTRAVENOUS

## 2024-06-27 DIAGNOSIS — D3A8 Other benign neuroendocrine tumors: Secondary | ICD-10-CM

## 2024-06-30 ENCOUNTER — Other Ambulatory Visit (HOSPITAL_COMMUNITY): Payer: Self-pay

## 2024-06-30 DIAGNOSIS — D3A8 Other benign neuroendocrine tumors: Secondary | ICD-10-CM

## 2024-06-30 DIAGNOSIS — C7A8 Other malignant neuroendocrine tumors: Secondary | ICD-10-CM

## 2024-08-27 ENCOUNTER — Encounter (HOSPITAL_BASED_OUTPATIENT_CLINIC_OR_DEPARTMENT_OTHER): Payer: Self-pay | Admitting: Emergency Medicine

## 2024-08-27 ENCOUNTER — Emergency Department (HOSPITAL_BASED_OUTPATIENT_CLINIC_OR_DEPARTMENT_OTHER)

## 2024-08-27 ENCOUNTER — Emergency Department (HOSPITAL_BASED_OUTPATIENT_CLINIC_OR_DEPARTMENT_OTHER)
Admission: EM | Admit: 2024-08-27 | Discharge: 2024-08-27 | Disposition: A | Discharge status: Home / Self Care | Attending: Emergency Medicine | Admitting: Emergency Medicine

## 2024-08-27 ENCOUNTER — Other Ambulatory Visit: Payer: Self-pay

## 2024-08-27 DIAGNOSIS — R1084 Generalized abdominal pain: Secondary | ICD-10-CM | POA: Diagnosis present

## 2024-08-27 DIAGNOSIS — K567 Ileus, unspecified: Secondary | ICD-10-CM | POA: Insufficient documentation

## 2024-08-27 DIAGNOSIS — E876 Hypokalemia: Secondary | ICD-10-CM | POA: Diagnosis not present

## 2024-08-27 LAB — URINALYSIS, ROUTINE W REFLEX MICROSCOPIC
Bacteria, UA: NONE SEEN
Bilirubin Urine: NEGATIVE
Glucose, UA: NEGATIVE mg/dL
Ketones, ur: 15 mg/dL — AB
Leukocytes,Ua: NEGATIVE
Nitrite: NEGATIVE
Specific Gravity, Urine: 1.043 — ABNORMAL HIGH (ref 1.005–1.030)
pH: 6 (ref 5.0–8.0)

## 2024-08-27 LAB — CBC
HCT: 43.2 % (ref 39.0–52.0)
Hemoglobin: 14.9 g/dL (ref 13.0–17.0)
MCH: 25.3 pg — ABNORMAL LOW (ref 26.0–34.0)
MCHC: 34.5 g/dL (ref 30.0–36.0)
MCV: 73.3 fL — ABNORMAL LOW (ref 80.0–100.0)
Platelets: 228 K/uL (ref 150–400)
RBC: 5.89 MIL/uL — ABNORMAL HIGH (ref 4.22–5.81)
RDW: 16.8 % — ABNORMAL HIGH (ref 11.5–15.5)
WBC: 6.4 K/uL (ref 4.0–10.5)
nRBC: 0 % (ref 0.0–0.2)

## 2024-08-27 LAB — COMPREHENSIVE METABOLIC PANEL WITH GFR
ALT: 14 U/L (ref 0–44)
AST: 15 U/L (ref 15–41)
Albumin: 3.7 g/dL (ref 3.5–5.0)
Alkaline Phosphatase: 83 U/L (ref 38–126)
Anion gap: 16 — ABNORMAL HIGH (ref 5–15)
BUN: 21 mg/dL (ref 8–23)
CO2: 24 mmol/L (ref 22–32)
Calcium: 10.1 mg/dL (ref 8.9–10.3)
Chloride: 100 mmol/L (ref 98–111)
Creatinine, Ser: 1.39 mg/dL — ABNORMAL HIGH (ref 0.61–1.24)
GFR, Estimated: 53 mL/min — ABNORMAL LOW (ref >60)
Glucose, Bld: 112 mg/dL — ABNORMAL HIGH (ref 70–99)
Potassium: 2.9 mmol/L — ABNORMAL LOW (ref 3.5–5.1)
Sodium: 140 mmol/L (ref 135–145)
Total Bilirubin: 0.9 mg/dL (ref 0.0–1.2)
Total Protein: 5.9 g/dL — ABNORMAL LOW (ref 6.5–8.1)

## 2024-08-27 LAB — LIPASE, BLOOD: Lipase: 13 U/L (ref 11–51)

## 2024-08-27 MED ORDER — ONDANSETRON 4 MG PO TBDP: 4.0000 mg | ORAL_TABLET | Freq: Three times a day (TID) | ORAL | 0 refills | Status: AC | PRN

## 2024-08-27 MED ORDER — LACTATED RINGERS IV BOLUS
1000.0000 mL | Freq: Once | INTRAVENOUS | Status: AC
  Administered 2024-08-27: 1000 mL via INTRAVENOUS

## 2024-08-27 MED ORDER — MORPHINE SULFATE (PF) 4 MG/ML IV SOLN
4.0000 mg | Freq: Once | INTRAVENOUS | Status: AC
  Administered 2024-08-27: 4 mg via INTRAVENOUS
  Filled 2024-08-27: qty 1

## 2024-08-27 MED ORDER — POTASSIUM CHLORIDE ER 10 MEQ PO TBCR: 10.0000 meq | EXTENDED_RELEASE_TABLET | Freq: Every day | ORAL | 0 refills | Status: AC

## 2024-08-27 MED ORDER — POTASSIUM CHLORIDE CRYS ER 20 MEQ PO TBCR
40.0000 meq | EXTENDED_RELEASE_TABLET | Freq: Once | ORAL | Status: AC
  Administered 2024-08-27: 40 meq via ORAL

## 2024-08-27 MED ORDER — IOHEXOL 300 MG/ML  SOLN
100.0000 mL | Freq: Once | INTRAMUSCULAR | Status: AC | PRN
  Administered 2024-08-27: 100 mL via INTRAVENOUS

## 2024-08-27 MED ORDER — ONDANSETRON HCL 4 MG/2ML IJ SOLN
4.0000 mg | Freq: Once | INTRAMUSCULAR | Status: AC
  Administered 2024-08-27: 4 mg via INTRAVENOUS
  Filled 2024-08-27: qty 2

## 2024-08-27 NOTE — ED Provider Notes (Signed)
 [place] EMERGENCY DEPARTMENT AT [facility] Provider Note   CSN: [id number] Arrival date & time: 08/27/24  1544     Patient presents with: Abdominal Pain   Dominic Turner is a 78 y.o. male.    Abdominal Pain  Patient is a 78 year old male present emergency room today with some generalized abdominal stroke discomfort and patient   Patient presents emergency room today with abdominal pain that began yesterday has been generalized and achy crampy pain.  He has some nausea but no vomiting.  Did have some watery diarrhea states that this is not improved.  He states that on 9/10 he had some intestines removed he is unable to tell me exactly what the surgery was for.  It seems that he had this done at [facility] [place]  He denies any chest pain or difficulty breathing.  He has been passing gas.  No blood in his stool.  Denies any urinary frequency urgency dysuria or hematuria.      Prior to Admission medications   Medication Sig Start Date End Date Taking? Authorizing Provider  ondansetron  (ZOFRAN -ODT) 4 MG disintegrating tablet Take 1 tablet (4 mg total) by mouth every 8 (eight) hours as needed for nausea or vomiting. 08/27/24  Yes Raiford Fetterman S, PA  potassium chloride  (KLOR-CON ) 10 MEQ tablet Take 1 tablet (10 mEq total) by mouth daily. 08/27/24  Yes Casimir Barcellos, Hamp S, PA  albuterol  (PROVENTIL  HFA;VENTOLIN  HFA) 108 (90 Base) MCG/ACT inhaler Take 2 puffs in AM & PM every day for next 5 days, & also can use every 4-6 hours as needed. After 5 days, use only every 4-6 hrs as needed 10/31/18   Osker Tinnie HERO, FNP  amLODipine  (NORVASC ) 10 MG tablet Take 1 tablet (10 mg total) by mouth daily. 02/18/21   Rilla Baller, MD  clobetasol  (TEMOVATE ) 0.05 % external solution Apply topically. 12/25/19   [provider]  Docusate Calcium (STOOL SOFTENER PO) Take 1 tablet by mouth daily.    [provider]  fluticasone  (CUTIVATE ) 0.05 % cream Apply topically. Apply to  affected areas on face up to twice a day as needed 11/11/19   [provider]  fluticasone  (FLONASE ) 50 MCG/ACT nasal spray USE 2 SPRAYS IN BOTH  NOSTRILS DAILY 12/24/20   Rilla Baller, MD  loratadine  (CLARITIN ) 10 MG tablet Take 1 tablet (10 mg total) by mouth daily. 07/22/19   Rilla Baller, MD  Multiple Vitamin Essential TABS Take 1 tablet by mouth daily.    [provider]  omeprazole  (PRILOSEC) 20 MG capsule Take 1 capsule (20 mg total) by mouth 2 (two) times daily before a meal for 14 days. 11/01/21 09/29/22  Anna, Kiran, MD  polyethylene glycol powder (GLYCOLAX /MIRALAX ) 17 GM/SCOOP powder Take 17 g by mouth daily. 07/22/19   Rilla Baller, MD  potassium chloride  SA (KLOR-CON  M) 20 MEQ tablet Take 1 tablet (20 mEq total) by mouth daily. 12/08/21   Dasie Tinnie MATSU, NP  pravastatin  (PRAVACHOL ) 40 MG tablet Take 1 tablet (40 mg total) by mouth daily. 02/18/21   Rilla Baller, MD  tamsulosin  (FLOMAX ) 0.4 MG CAPS capsule Take 1 capsule (0.4 mg total) by mouth daily. 02/18/21   Rilla Baller, MD  tiotropium (SPIRIVA  HANDIHALER) 18 MCG inhalation capsule INHALE THE CONTENTS OF 1  CAPSULE BY MOUTH VIA  HANDIHALER DAILY AT BEDTIME 02/18/21   Rilla Baller, MD  traZODone (DESYREL) 50 MG tablet TAKE ONE AND ONE-HALF TABLETS BY MOUTH AT BEDTIME AS NEEDED 08/03/21   [provider]    Allergies: Aspirin , Sertraline hcl, Sulfonamide derivatives, and Wellbutrin  [bupropion ]    Review of Systems  Gastrointestinal:  Positive for abdominal pain.    Updated Vital Signs BP (!) 122/56   Pulse 80   Temp 98 F (36.7 C) (Oral)   Resp 16   SpO2 100%   Physical Exam Vitals and nursing note reviewed.  Constitutional:      General: He is not in acute distress. HENT:     Head: Normocephalic and atraumatic.     Nose: Nose normal.     Mouth/Throat:     Mouth: Mucous membranes are moist.  Eyes:     General: No scleral icterus. Cardiovascular:     Rate and  Rhythm: Normal rate and regular rhythm.     Pulses: Normal pulses.     Heart sounds: Normal heart sounds.  Pulmonary:     Effort: Pulmonary effort is normal. No respiratory distress.     Breath sounds: No wheezing.  Abdominal:     Palpations: Abdomen is soft.     Tenderness: There is abdominal tenderness. There is no guarding or rebound.     Comments: Mild generalized ttp without rebound. No bruising.   Musculoskeletal:     Cervical back: Normal range of motion.     Right lower leg: No edema.     Left lower leg: No edema.  Skin:    General: Skin is warm and dry.     Capillary Refill: Capillary refill takes less than 2 seconds.  Neurological:     Mental Status: He is alert. Mental status is at baseline.  Psychiatric:        Mood and Affect: Mood normal.        Behavior: Behavior normal.     (all labs ordered are listed, but only abnormal results are displayed) Labs Reviewed  COMPREHENSIVE METABOLIC PANEL WITH GFR - Abnormal; Notable for the following components:      Result Value   Potassium 2.9 (*)    Glucose, Bld 112 (*)    Creatinine, Ser 1.39 (*)    Total Protein 5.9 (*)    GFR, Estimated 53 (*)    Anion gap 16 (*)    All other components within normal limits  CBC - Abnormal; Notable for the following components:   RBC 5.89 (*)    MCV 73.3 (*)    MCH 25.3 (*)    RDW 16.8 (*)    All other components within normal limits  URINALYSIS, ROUTINE W REFLEX MICROSCOPIC - Abnormal; Notable for the following components:   Specific Gravity, Urine 1.043 (*)    Hgb urine dipstick SMALL (*)    Ketones, ur 15 (*)    Protein, ur TRACE (*)    All other components within normal limits  LIPASE, BLOOD    EKG: None  Radiology: CT ABDOMEN PELVIS W CONTRAST Result Date: 08/27/2024 CLINICAL DATA:  Left lower quadrant abdominal pain. Neuroendocrine tumor of small-bowel mesentery. EXAM: CT ABDOMEN AND PELVIS WITH CONTRAST TECHNIQUE: Multidetector CT imaging of the abdomen and pelvis  was performed using the standard protocol following bolus administration of intravenous contrast. RADIATION DOSE REDUCTION: This exam was performed according to the departmental dose-optimization program which includes automated exposure control, adjustment of the mA and/or kV according to patient size and/or use of iterative reconstruction technique. CONTRAST:  OMNIPAQUE  IOHEXOL  300 MG/ML  SOLN COMPARISON:  CT abdomen pelvis dated 09/01/2021. FINDINGS: Lower chest: Eventration of the right hemidiaphragm with right  lung base atelectasis. Small amount of pneumoperitoneum under the right hemidiaphragm likely related to recent surgery. Clinical correlation is recommended. Small free fluid in the pelvis. Hepatobiliary: Several small liver cysts. There is mild dilatation, post cholecystectomy. Pancreas: Unremarkable. No pancreatic ductal dilatation or surrounding inflammatory changes. Spleen: Normal in size without focal abnormality. Adrenals/Urinary Tract: The adrenal glands unremarkable. There is mild right hydronephrosis, new since the prior CT. There is symmetric enhancement and excretion of contrast by both kidneys. There is a 5 mm stone in the distal right ureter close to the ureterovesical junction.The left kidney, left ureter, and urinary bladder are unremarkable. Stomach/Bowel: Postsurgical changes of bowel with anastomotic staple line in the right lower abdomen. There is mild dilatation of several small bowel loops with engorgement of the associated mesentery and mildly thickened wall. Findings likely represent postoperative ileus. Enteritis or developing obstruction are less likely but not excluded. Clinical correlation recommended. Small-bowel series may provide better evaluation. The appendix is normal. Vascular/Lymphatic: Mild aortoiliac atherosclerotic disease. The IVC is unremarkable. No portal venous gas. There is no adenopathy. Reproductive: The prostate and seminal vesicles are grossly  unremarkable Other: Midline vertical anterior abdominal wall incisional scar. Musculoskeletal: Osteopenia with degenerative changes. No acute osseous pathology. IMPRESSION: 1. Postsurgical changes of bowel with findings likely represent postoperative ileus. Clinical correlation is recommended. 2. A 5 mm stone in the distal right ureter with mild right hydronephrosis. 3. Small pneumoperitoneum, likely related to recent surgery. 4.  Aortic Atherosclerosis (ICD10-I70.0). Electronically Signed   By: Vanetta Chou M.D.   On: 08/27/2024 19:56     Procedures   Medications Ordered in the ED  lactated ringers  bolus 1,000 mL (0 mLs Intravenous Stopped 08/27/24 2100)  morphine  (PF) 4 MG/ML injection 4 mg (4 mg Intravenous Given 08/27/24 1739)  ondansetron  (ZOFRAN ) injection 4 mg (4 mg Intravenous Given 08/27/24 1738)  iohexol  (OMNIPAQUE ) 300 MG/ML solution 100 mL (100 mLs Intravenous Contrast Given 08/27/24 1851)  potassium chloride  SA (KLOR-CON  M) CR tablet 40 mEq (40 mEq Oral Given 08/27/24 2101)    Clinical Course as of 08/28/24 0021  Wed Aug 27, 2024  1713 9/10 had surgery to remove mass in intenstine.  [place]  [WF]  1717 Yesterday pain [WF]    Clinical Course User Index [WF] Neldon Hamp RAMAN, [place]                                 Medical Decision Making Amount and/or Complexity of Data Reviewed Labs: ordered. Radiology: ordered.  Risk Prescription drug management.   This patient presents to the ED for concern of abd pain, this involves a number of treatment options, and is a complaint that carries with it a moderate risk of complications and morbidity. A differential diagnosis was considered for the patient's symptoms which is discussed below:   The causes of generalized abdominal pain include but are not limited to AAA, mesenteric ischemia, appendicitis, diverticulitis, DKA, gastritis, gastroenteritis, AMI, nephrolithiasis, pancreatitis, peritonitis, adrenal insufficiency,lead poisoning,  iron toxicity, intestinal ischemia, constipation, UTI,SBO/LBO, splenic rupture, biliary disease, IBD, IBS, PUD, or hepatitis.   Co morbidities: Discussed in HPI   Brief History:  Patient is a 78 year old male present emergency room today with some generalized abdominal stroke discomfort and patient   Patient presents emergency room today with abdominal pain that began yesterday has been generalized and achy crampy pain.  He has some nausea but no vomiting.  Did have some watery diarrhea  states that this is not improved.  He states that on 9/10 he had some intestines removed he is unable to tell me exactly what the surgery was for.  It seems that he had this done at the [facility] [place]  He denies any chest pain or difficulty breathing.  He has been passing gas.  No blood in his stool.  Denies any urinary frequency urgency dysuria or hematuria.    EMR reviewed including pt PMHx, past surgical history and past visits to ER.   See HPI for more details   Lab Tests:   I ordered and independently interpreted labs. Labs notable for Patient without leukocytosis or anemia.  CMP with mild hypokalemia.  Urinalysis with increased specific gravity but mild elevation in creatinine consistent with some dehydration.  Patient is tolerating p.o.  Imaging Studies:  Abnormal findings. I personally reviewed all imaging studies. Imaging notable for possible mild ileus.  Postsurgical changes also noted.    Cardiac Monitoring:  The patient was maintained on a cardiac monitor.  I personally viewed and interpreted the cardiac monitored which showed an underlying rhythm of: NSR NA   Medicines ordered:  I ordered medication including potassium, LR, morphine , Zofran  for pain nausea vomiting and hypokalemia Reevaluation of the patient after these medicines showed that the patient improved I have reviewed the patients home medicines and have made adjustments as needed   Critical  Interventions:     Consults/Attending Physician   I discussed this case with my attending physician who cosigned this note including patient's presenting symptoms, physical exam, and planned diagnostics and interventions. Attending physician stated agreement with plan or made changes to plan which were implemented.   Reevaluation:  After the interventions noted above I re-evaluated patient and found that they have :improved   Social Determinants of Health:      Problem List / ED Course:  Patient with postsurgical ileus.  No signs of infection or abscess or anastomotic leak on CT abdomen pelvis.  Patient is tolerating p.o. and is requesting discharge.  I did offer admission given some dehydration the patient is experiencing he would prefer to be discharged home at this time.  Return precautions provided the patient he will return to emergency room for any new or concerning symptoms.   Dispostion:  After consideration of the diagnostic results and the patients response to treatment, I feel that the patent would benefit from outpatient follow-up.     Patient eloped after PO challenge but before formal discharge.    Final diagnoses:  Ileus [facility])    ED Discharge Orders          Ordered    potassium chloride  (KLOR-CON ) 10 MEQ tablet  Daily        08/27/24 2202    ondansetron  (ZOFRAN -ODT) 4 MG disintegrating tablet  Every 8 hours PRN        08/27/24 2202               Neldon Hamp RAMAN, [place] 08/28/24 0021    Tegeler, Lonni PARAS, MD 08/30/24 1459

## 2024-08-27 NOTE — ED Notes (Signed)
Bladder scan 25mL

## 2024-08-27 NOTE — ED Triage Notes (Signed)
 Recent abdo surgery last Wednesday 9/10 at [facility] Now having severe pain Nausea, some diarrhea and sick on my stomach No relief with pain medications Last bm  'a couple days ago
